# Patient Record
Sex: Male | Born: 1960 | Race: White | Hispanic: No | Marital: Single | Smoking: Current every day smoker
Health system: Southern US, Community
[De-identification: ages and names within clinical notes are randomized; demographics above are authoritative.]

## PROBLEM LIST (undated history)

## (undated) DIAGNOSIS — I10 Essential (primary) hypertension: Secondary | ICD-10-CM

## (undated) DIAGNOSIS — Z720 Tobacco use: Secondary | ICD-10-CM

## (undated) DIAGNOSIS — F1010 Alcohol abuse, uncomplicated: Secondary | ICD-10-CM

## (undated) DIAGNOSIS — L309 Dermatitis, unspecified: Secondary | ICD-10-CM

## (undated) DIAGNOSIS — K859 Acute pancreatitis without necrosis or infection, unspecified: Secondary | ICD-10-CM

## (undated) DIAGNOSIS — E7800 Pure hypercholesterolemia, unspecified: Secondary | ICD-10-CM

## (undated) DIAGNOSIS — E119 Type 2 diabetes mellitus without complications: Secondary | ICD-10-CM

## (undated) HISTORY — DX: Pure hypercholesterolemia, unspecified: E78.00

## (undated) HISTORY — PX: WRIST SURGERY: SHX841

---

## 2001-08-22 ENCOUNTER — Emergency Department (HOSPITAL_COMMUNITY): Admission: EM | Admit: 2001-08-22 | Discharge: 2001-08-22 | Payer: Self-pay | Admitting: Emergency Medicine

## 2001-08-22 ENCOUNTER — Encounter: Payer: Self-pay | Admitting: Emergency Medicine

## 2015-05-24 ENCOUNTER — Ambulatory Visit: Payer: Self-pay | Admitting: Neurology

## 2018-09-08 ENCOUNTER — Encounter (HOSPITAL_COMMUNITY): Payer: Self-pay | Admitting: Emergency Medicine

## 2018-09-08 ENCOUNTER — Emergency Department (HOSPITAL_COMMUNITY): Payer: Self-pay

## 2018-09-08 ENCOUNTER — Inpatient Hospital Stay (HOSPITAL_COMMUNITY)
Admission: EM | Admit: 2018-09-08 | Discharge: 2018-09-10 | DRG: 439 | Disposition: A | Discharge status: Home / Self Care | Payer: Self-pay | Attending: Internal Medicine | Admitting: Internal Medicine

## 2018-09-08 ENCOUNTER — Other Ambulatory Visit: Payer: Self-pay

## 2018-09-08 DIAGNOSIS — R112 Nausea with vomiting, unspecified: Secondary | ICD-10-CM

## 2018-09-08 DIAGNOSIS — F10188 Alcohol abuse with other alcohol-induced disorder: Secondary | ICD-10-CM | POA: Diagnosis present

## 2018-09-08 DIAGNOSIS — E876 Hypokalemia: Secondary | ICD-10-CM | POA: Diagnosis present

## 2018-09-08 DIAGNOSIS — R109 Unspecified abdominal pain: Secondary | ICD-10-CM | POA: Diagnosis present

## 2018-09-08 DIAGNOSIS — R1013 Epigastric pain: Secondary | ICD-10-CM

## 2018-09-08 DIAGNOSIS — F1010 Alcohol abuse, uncomplicated: Secondary | ICD-10-CM

## 2018-09-08 DIAGNOSIS — E781 Pure hyperglyceridemia: Secondary | ICD-10-CM | POA: Diagnosis present

## 2018-09-08 DIAGNOSIS — Z720 Tobacco use: Secondary | ICD-10-CM

## 2018-09-08 DIAGNOSIS — F17210 Nicotine dependence, cigarettes, uncomplicated: Secondary | ICD-10-CM | POA: Diagnosis present

## 2018-09-08 DIAGNOSIS — E785 Hyperlipidemia, unspecified: Secondary | ICD-10-CM | POA: Diagnosis present

## 2018-09-08 DIAGNOSIS — Z880 Allergy status to penicillin: Secondary | ICD-10-CM

## 2018-09-08 DIAGNOSIS — K852 Alcohol induced acute pancreatitis without necrosis or infection: Principal | ICD-10-CM

## 2018-09-08 DIAGNOSIS — K859 Acute pancreatitis without necrosis or infection, unspecified: Secondary | ICD-10-CM | POA: Insufficient documentation

## 2018-09-08 DIAGNOSIS — L309 Dermatitis, unspecified: Secondary | ICD-10-CM | POA: Diagnosis present

## 2018-09-08 DIAGNOSIS — Y901 Blood alcohol level of 20-39 mg/100 ml: Secondary | ICD-10-CM | POA: Diagnosis present

## 2018-09-08 HISTORY — DX: Dermatitis, unspecified: L30.9

## 2018-09-08 HISTORY — DX: Tobacco use: Z72.0

## 2018-09-08 HISTORY — DX: Acute pancreatitis without necrosis or infection, unspecified: K85.90

## 2018-09-08 HISTORY — DX: Alcohol abuse, uncomplicated: F10.10

## 2018-09-08 LAB — LIPASE, BLOOD: Lipase: 218 U/L — ABNORMAL HIGH (ref 11–51)

## 2018-09-08 LAB — RAPID URINE DRUG SCREEN, HOSP PERFORMED
Amphetamines: NOT DETECTED
Barbiturates: NOT DETECTED
Benzodiazepines: NOT DETECTED
Cocaine: NOT DETECTED
Opiates: POSITIVE — AB
Tetrahydrocannabinol: NOT DETECTED

## 2018-09-08 LAB — COMPREHENSIVE METABOLIC PANEL
ALBUMIN: 4 g/dL (ref 3.5–5.0)
ALK PHOS: 77 U/L (ref 38–126)
ALT: 83 U/L — ABNORMAL HIGH (ref 0–44)
AST: 89 U/L — ABNORMAL HIGH (ref 15–41)
Anion gap: 17 — ABNORMAL HIGH (ref 5–15)
BUN: 12 mg/dL (ref 6–20)
CO2: 17 mmol/L — ABNORMAL LOW (ref 22–32)
Calcium: 8.7 mg/dL — ABNORMAL LOW (ref 8.9–10.3)
Chloride: 102 mmol/L (ref 98–111)
Creatinine, Ser: 0.72 mg/dL (ref 0.61–1.24)
GFR calc Af Amer: >60 mL/min (ref >60)
GFR calc non Af Amer: >60 mL/min (ref >60)
GLUCOSE: 143 mg/dL — AB (ref 70–99)
Potassium: 3.6 mmol/L (ref 3.5–5.1)
Sodium: 136 mmol/L (ref 135–145)
Total Bilirubin: 1.4 mg/dL — ABNORMAL HIGH (ref 0.3–1.2)
Total Protein: 6.6 g/dL (ref 6.5–8.1)

## 2018-09-08 LAB — CBC
HCT: 45.4 % (ref 39.0–52.0)
Hemoglobin: 15.8 g/dL (ref 13.0–17.0)
MCH: 32.2 pg (ref 26.0–34.0)
MCHC: 34.8 g/dL (ref 30.0–36.0)
MCV: 92.7 fL (ref 80.0–100.0)
Platelets: 166 10*3/uL (ref 150–400)
RBC: 4.9 MIL/uL (ref 4.22–5.81)
RDW: 13.2 % (ref 11.5–15.5)
WBC: 11.1 10*3/uL — ABNORMAL HIGH (ref 4.0–10.5)
nRBC: 0 % (ref 0.0–0.2)

## 2018-09-08 LAB — MAGNESIUM: Magnesium: 1.7 mg/dL (ref 1.7–2.4)

## 2018-09-08 LAB — URINALYSIS, ROUTINE W REFLEX MICROSCOPIC
Bacteria, UA: NONE SEEN
Bilirubin Urine: NEGATIVE
Glucose, UA: NEGATIVE mg/dL
Ketones, ur: 5 mg/dL — AB
Leukocytes,Ua: NEGATIVE
Nitrite: NEGATIVE
PROTEIN: NEGATIVE mg/dL
Specific Gravity, Urine: 1.029 (ref 1.005–1.030)
pH: 7 (ref 5.0–8.0)

## 2018-09-08 LAB — ETHANOL: Alcohol, Ethyl (B): 32 mg/dL — ABNORMAL HIGH (ref <10)

## 2018-09-08 IMAGING — CT CT ABD-PELV W/ CM
3 of 5 series · 16 of 46 positions shown, 18 images · IV contrast (ISOVUE)
Comparison: None.

CLINICAL DATA: Abdominal pain 1 day

EXAM:
CT ABDOMEN AND PELVIS WITH CONTRAST
TECHNIQUE: Multidetector CT imaging of the abdomen and pelvis was performed
using the standard protocol following bolus administration of
intravenous contrast.
CONTRAST:  100mL [LL] IOPAMIDOL ([LL]) INJECTION 61%

[Series 2: axial st · axial · 0.76mm/px · z∈[-558,-153]mm · 10 of 101 slices shown, 12 images]
[im 10/101  soft-tissue]
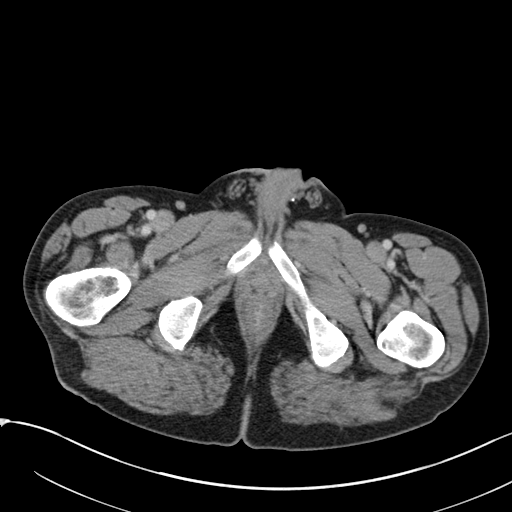
[im 10/101  bone]
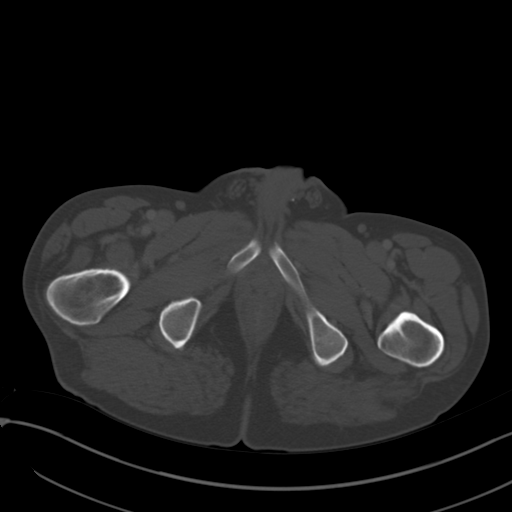
[im 19/101  soft-tissue]
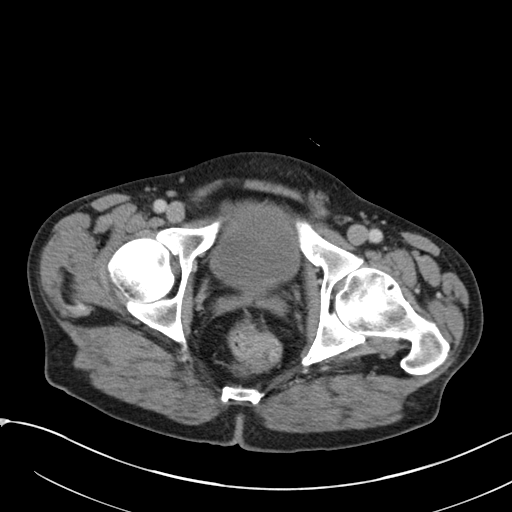
[im 28/101  soft-tissue]
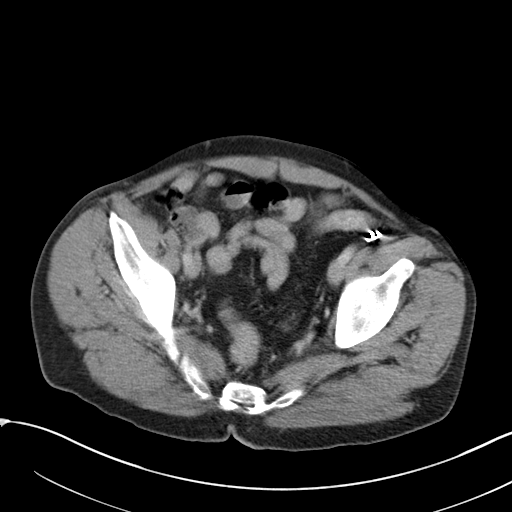
[im 37/101  soft-tissue]
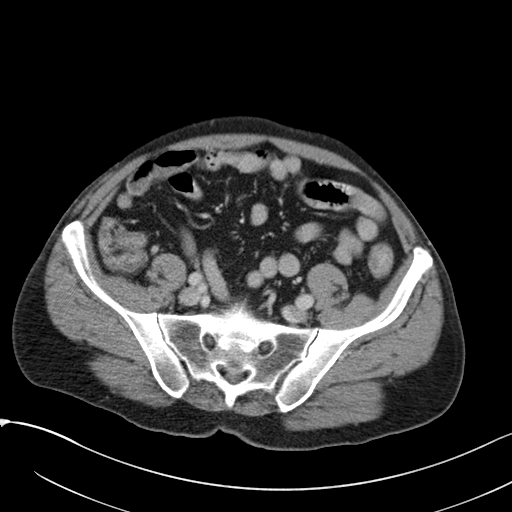
[im 46/101  soft-tissue]
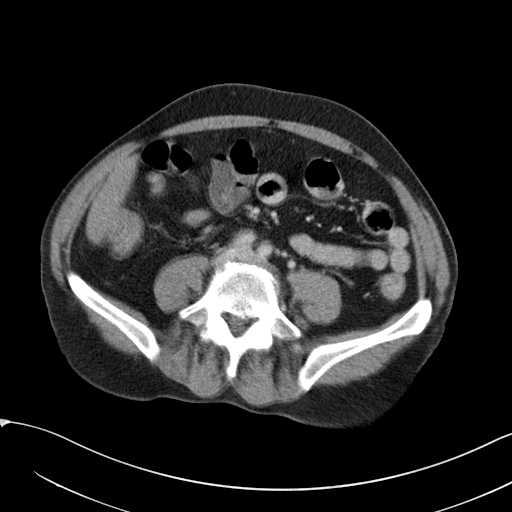
[im 55/101  soft-tissue]
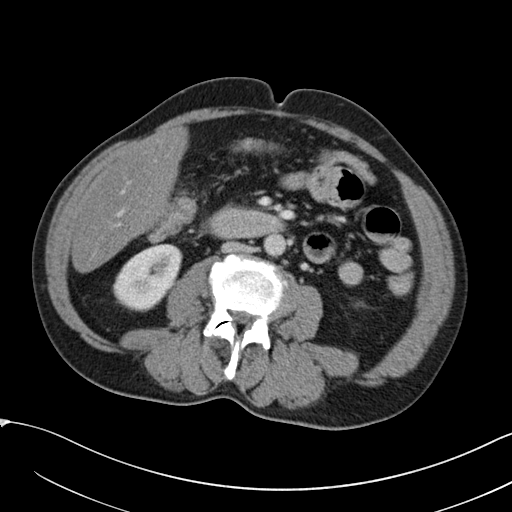
[im 64/101  soft-tissue]
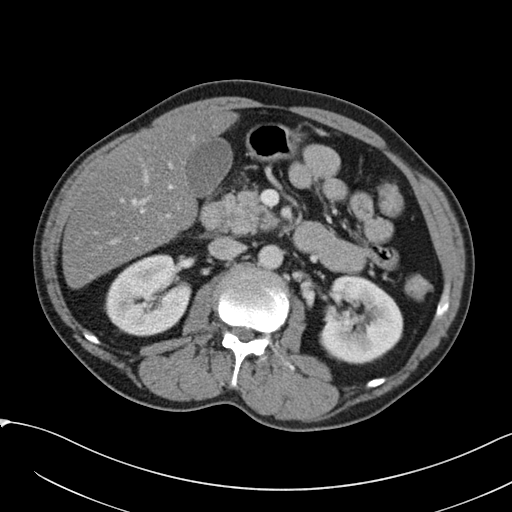
[im 73/101  soft-tissue]
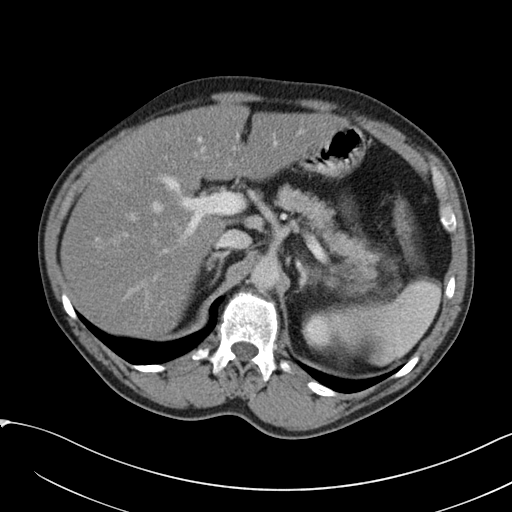
[im 82/101  soft-tissue]
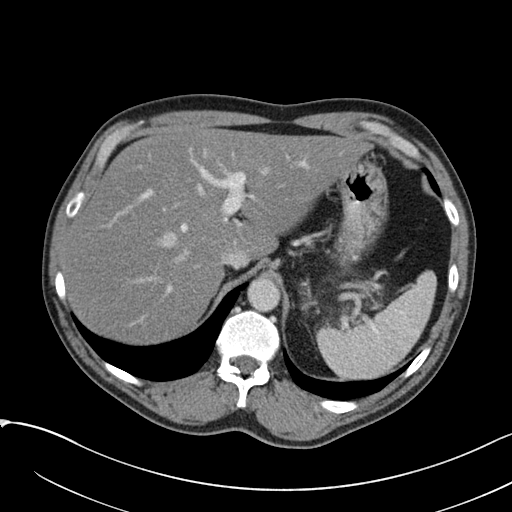
[im 82/101  bone]
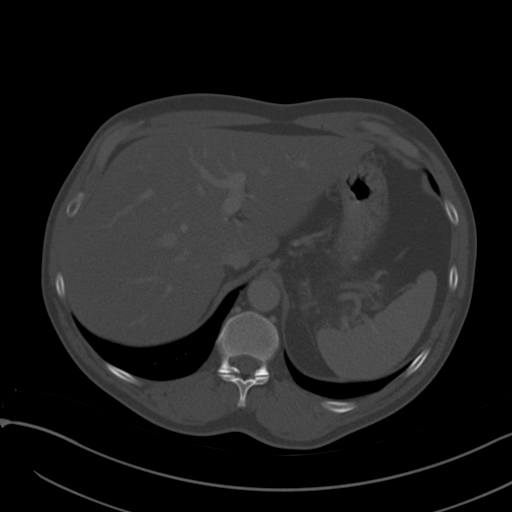
[im 91/101  soft-tissue]
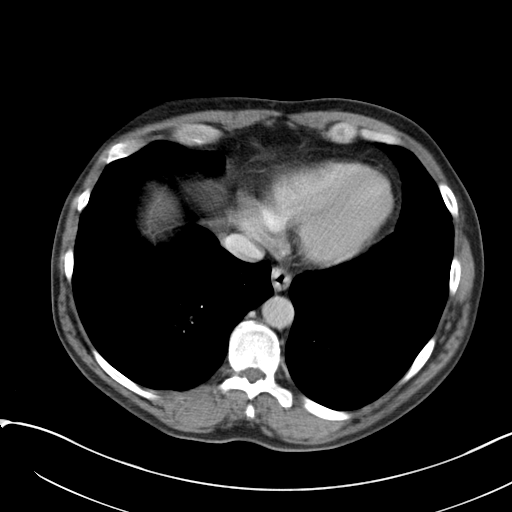

[Series 4: lung bases · axial · 0.70mm/px · z∈[-277,-225]mm · 3 of 96 slices shown]
[im 9/96  bone]
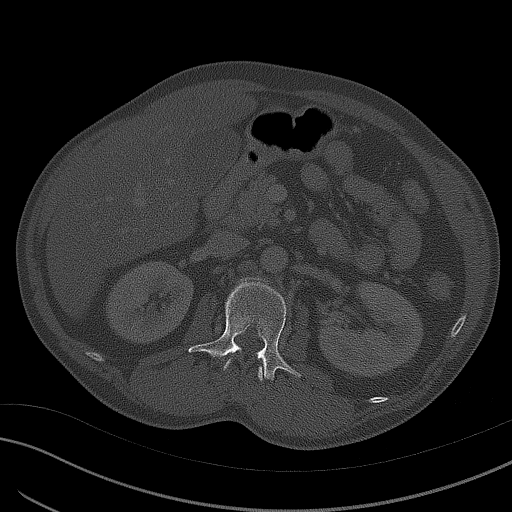
[im 18/96  bone]
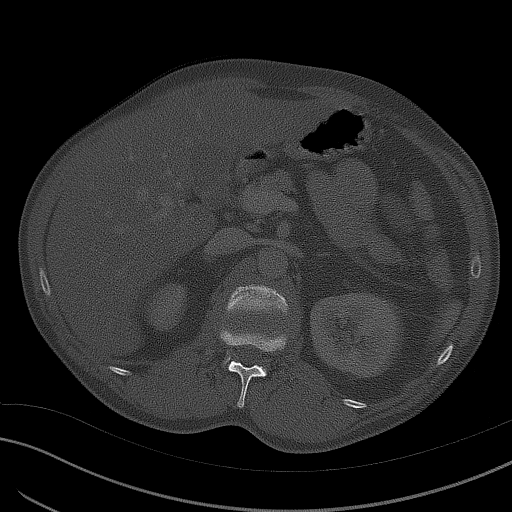
[im 35/96  bone]
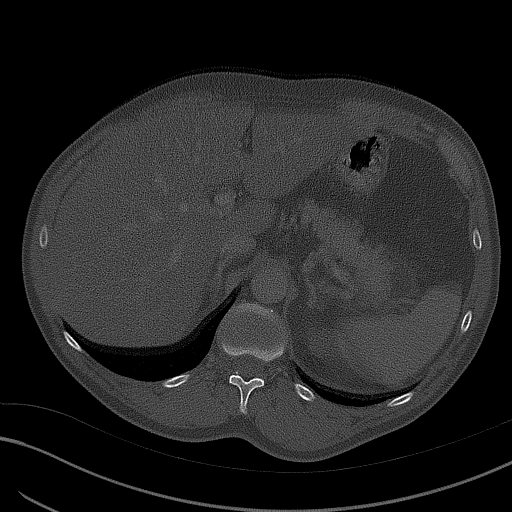

[Series 5: coronal st · coronal · 0.74mm/px · 3 of 150 slices shown]
[im 50/150  soft-tissue]
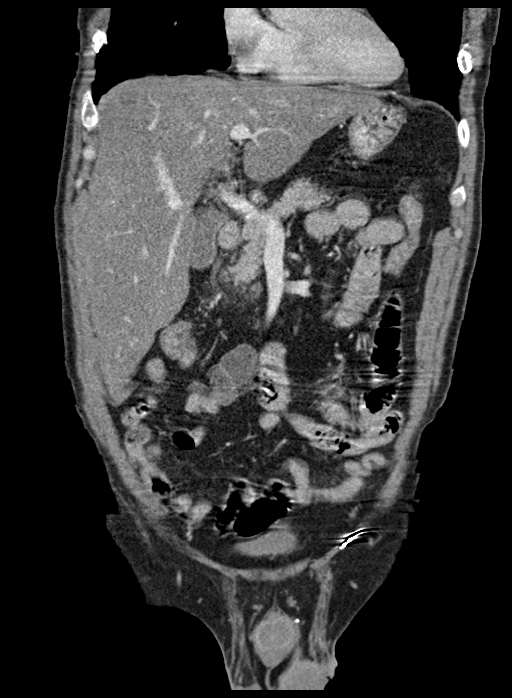
[im 67/150  soft-tissue]
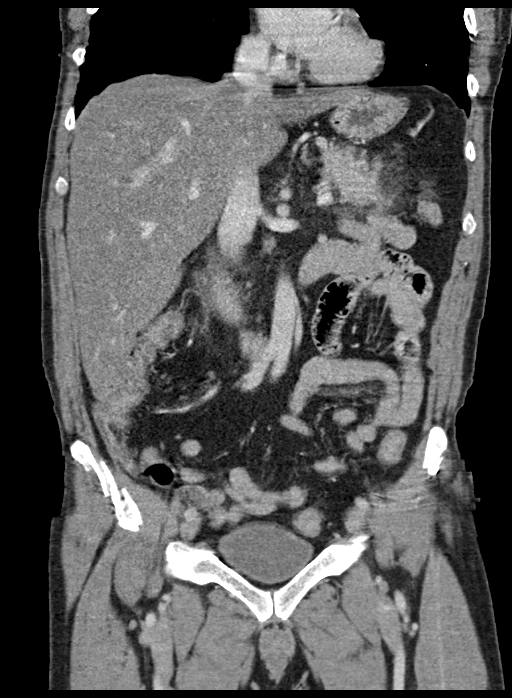
[im 83/150  soft-tissue]
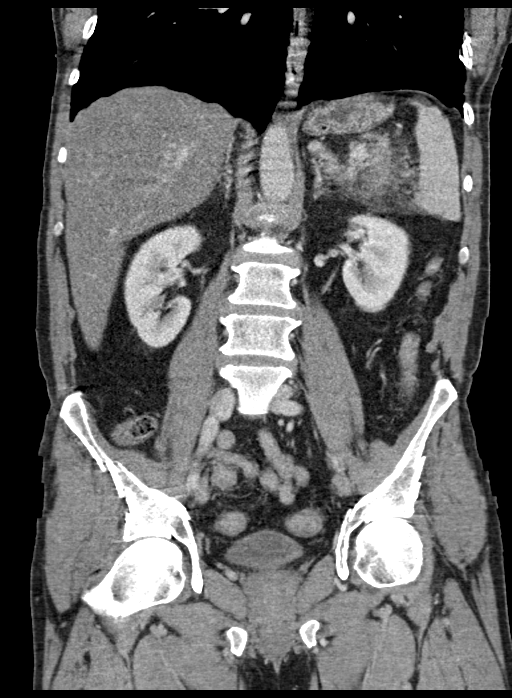

[16 of 46 positions shown; findings below may reference images not displayed]

FINDINGS: Lower chest: No acute abnormality.

Hepatobiliary: Diffuse hepatic steatosis.  Unremarkable gallbladder.

Pancreas: There is stranding about the head and tail of the
pancreas. This is compatible with pancreatitis. There is no evidence
of pancreatic hemorrhage or pancreatic necrosis.

Spleen: Unremarkable

Adrenals/Urinary Tract: Kidneys and adrenal glands are unremarkable.
Bladder is decompressed and within normal limits.

Stomach/Bowel: Normal appendix. No obvious mass in the colon. Small
bowel and stomach are decompressed.

Vascular/Lymphatic: No abnormal retroperitoneal adenopathy. No
evidence of aortic aneurysm.

Reproductive: Unremarkable prostate.

Other: No free fluid.

Musculoskeletal: No vertebral compression deformity. There are short
pedicles in the lumbar spine resulting in congenital spinal
stenosis. L4-5 moderate facet arthropathy. Old right L4 nonunited
transverse process fracture.
IMPRESSION: Findings above are consistent with acute pancreatitis. There is no
acute peripancreatic fluid collection, pancreatic necrosis, or
pancreatic hemorrhage.

Diffuse hepatic steatosis.

## 2018-09-08 MED ORDER — HYDROMORPHONE HCL 1 MG/ML IJ SOLN
0.5000 mg | INTRAMUSCULAR | Status: DC | PRN
  Administered 2018-09-08 – 2018-09-09 (×4): 1 mg via INTRAVENOUS
  Filled 2018-09-08 (×4): qty 1

## 2018-09-08 MED ORDER — ONDANSETRON HCL 4 MG/2ML IJ SOLN
4.0000 mg | Freq: Four times a day (QID) | INTRAMUSCULAR | Status: DC | PRN
  Administered 2018-09-08 – 2018-09-10 (×5): 4 mg via INTRAVENOUS
  Filled 2018-09-08 (×5): qty 2

## 2018-09-08 MED ORDER — ONDANSETRON HCL 4 MG/2ML IJ SOLN
4.0000 mg | Freq: Once | INTRAMUSCULAR | Status: AC
  Administered 2018-09-08: 4 mg via INTRAVENOUS
  Filled 2018-09-08: qty 2

## 2018-09-08 MED ORDER — HYDROMORPHONE HCL 1 MG/ML IJ SOLN
1.0000 mg | Freq: Once | INTRAMUSCULAR | Status: AC
  Administered 2018-09-08: 1 mg via INTRAVENOUS
  Filled 2018-09-08: qty 1

## 2018-09-08 MED ORDER — FENTANYL CITRATE (PF) 100 MCG/2ML IJ SOLN
50.0000 ug | INTRAMUSCULAR | Status: DC | PRN
  Administered 2018-09-08: 50 ug via NASAL
  Filled 2018-09-08: qty 2

## 2018-09-08 MED ORDER — ONDANSETRON 4 MG PO TBDP
4.0000 mg | ORAL_TABLET | Freq: Once | ORAL | Status: AC | PRN
  Administered 2018-09-08: 4 mg via ORAL
  Filled 2018-09-08: qty 1

## 2018-09-08 MED ORDER — MORPHINE SULFATE (PF) 4 MG/ML IV SOLN
4.0000 mg | Freq: Once | INTRAVENOUS | Status: AC
  Administered 2018-09-08: 4 mg via INTRAVENOUS
  Filled 2018-09-08: qty 1

## 2018-09-08 MED ORDER — IOPAMIDOL (ISOVUE-300) INJECTION 61%
INTRAVENOUS | Status: AC
  Filled 2018-09-08: qty 100

## 2018-09-08 MED ORDER — ENOXAPARIN SODIUM 40 MG/0.4ML ~~LOC~~ SOLN
40.0000 mg | SUBCUTANEOUS | Status: DC
  Administered 2018-09-09: 40 mg via SUBCUTANEOUS
  Filled 2018-09-08: qty 0.4

## 2018-09-08 MED ORDER — SODIUM CHLORIDE (PF) 0.9 % IJ SOLN
INTRAMUSCULAR | Status: AC
  Filled 2018-09-08: qty 50

## 2018-09-08 MED ORDER — SODIUM CHLORIDE 0.9 % IV BOLUS
1000.0000 mL | Freq: Once | INTRAVENOUS | Status: AC
  Administered 2018-09-08: 1000 mL via INTRAVENOUS

## 2018-09-08 MED ORDER — IOPAMIDOL (ISOVUE-300) INJECTION 61%
100.0000 mL | Freq: Once | INTRAVENOUS | Status: AC | PRN
  Administered 2018-09-08: 100 mL via INTRAVENOUS

## 2018-09-08 MED ORDER — LORAZEPAM 2 MG/ML IJ SOLN: 1.0000 mg | Freq: Four times a day (QID) | INTRAMUSCULAR | Status: DC | PRN

## 2018-09-08 MED ORDER — SODIUM CHLORIDE 0.9% FLUSH
3.0000 mL | Freq: Once | INTRAVENOUS | Status: AC
  Administered 2018-09-08: 3 mL via INTRAVENOUS

## 2018-09-08 MED ORDER — SODIUM CHLORIDE 0.9 % IV SOLN
INTRAVENOUS | Status: DC
  Administered 2018-09-09: 09:00:00 via INTRAVENOUS

## 2018-09-08 MED ORDER — NICOTINE 21 MG/24HR TD PT24: 21.0000 mg | MEDICATED_PATCH | Freq: Every day | TRANSDERMAL | Status: DC | PRN

## 2018-09-08 MED ORDER — METOCLOPRAMIDE HCL 5 MG/ML IJ SOLN
10.0000 mg | Freq: Once | INTRAMUSCULAR | Status: AC
  Administered 2018-09-08: 10 mg via INTRAVENOUS
  Filled 2018-09-08: qty 2

## 2018-09-08 MED ORDER — THIAMINE HCL 100 MG/ML IJ SOLN
Freq: Once | INTRAVENOUS | Status: AC
  Administered 2018-09-08: 23:00:00 via INTRAVENOUS
  Filled 2018-09-08 (×2): qty 1000

## 2018-09-08 NOTE — ED Triage Notes (Signed)
Pt presents via ems. Abd pain since last night. Stabbing feeling. Generalized. N/V/D. Alert and oriented.

## 2018-09-08 NOTE — H&P (Signed)
History and Physical    PLEASE NOTE THAT DRAGON DICTATION SOFTWARE WAS USED IN THE CONSTRUCTION OF THIS NOTE.   Samuel Abbott:[id number] DOB: Sep 19, 1960 DOA: 09/08/2018  PCP: Norm Salt, PA Patient coming from: home  I have personally briefly reviewed patient's old medical records in [facility]  Chief Complaint: abdominal pain  HPI: Samuel Abbott is a 58 y.o. male with medical history significant for acute pancreatitis, chronic alcohol abuse, who is admitted to [facility] long hospital on 09/08/2018 with acute alcoholic pancreatitis after presenting from home to the [facility] long emergency department complaining of abdominal pain.  The patient reports 1 day of progressive, constant, stabbing abdominal discomfort that he describes as diffuse in orientation, but most prominent over the epigastrium.  Reports exacerbation of the discomfort with palpation of the abdomen.  He conveys associated nausea resulting in 4-5 episodes of nonbloody, nonbilious emesis over that same timeframe.  He also notes one episode of loose stool, that occurred earlier today in the absence of any associated melena or hematochezia.  He denies any associated subjective fever, chills, rigors, or generalized myalgias.  Also denies any associated pain, shortness of breath, or rash.  No recent travel.  Denies any recent trauma.  The patient acknowledges a history of chronic alcohol abuse, in which he reports consumption of 8 shots of whiskey and 2 beers on a daily basis for approximately the last 20 years.  He reports 1 prior episode of acute pancreatitis, which he states occurred approximately 15 years ago.  In terms of recent changes to his outpatient medication regimen, the patient reports that, 3 weeks ago, he was prescribed prednisone 20 mg p.o. daily as needed for exacerbation of eczema.  Over the last 3 weeks, he reports that he is typically taken the prednisone once every 3 to 4 days.  Denies any recent use of  antibiotics or thiazide diuretics.   ED Course: Vital signs in the ED were notable for the following: Temperature max 98; heart rate 73-90; blood pressure 168/100; respiratory rate 16-18; and oxygen saturation 98-100% on room air.  Labs in the ED were notable for the following: CMP notable for sodium 136, potassium 3.6, creatinine 0.72, adjusted calcium 8.7, alkaline phosphatase 77, AST 89, ALT 83, and total bilirubin 1.4.  Lipase 218.  Serum ethanol level 32.  CT abdomen/pelvis, per final radiology report, showed inflammation and stranding associated with the head and tail the pancreas consistent with acute pancreatitis in the absence of any associated evidence of cyst or necrosis.  Additionally, CT abdomen/pelvis showed no evidence of gallbladder wall inflammation, dilation of common bile duct, or choledocholithiasis.  While in the ED, the following were administered: Fentanyl 50 mcg IV x1, Dilaudid 1 mg IV x1, morphine 4 mg IV x1, Zofran 4 mg IV x1, Reglan 10 mg IV x1, and 2 L IV normal saline bolus.  Subsequently, the patient was admitted to the MedSurg floor for further evaluation and management of suspected acute alcoholic pancreatitis.   Review of Systems: As per HPI otherwise 10 point review of systems negative.   Past Medical History:  Diagnosis Date  . Acute pancreatitis   . Alcohol abuse   . Eczema   . Tobacco abuse     History reviewed. No pertinent surgical history.  (Patient denies any prior surgical procedures).   Social History:  reports that he has been smoking cigarettes. He has a 60.00 pack-year smoking history. He uses smokeless tobacco. He reports current alcohol use  of about 56.0 standard drinks of alcohol per week. No history on file for drug.   Allergies  Allergen Reactions  . Penicillins     Family History  Problem Relation Age of Onset  . CVA Father      Prior to Admission medications   Not on File     Objective    Physical Exam: Vitals:    09/08/18 1543 09/08/18 1547 09/08/18 1708 09/08/18 1730  BP:   (!) 168/100 (!) 181/95  Pulse:  89 88 73  Resp:  (!) 22 18 16   Temp:   98 F (36.7 C)   TempSrc:  Oral Axillary   SpO2: 98% 100% 100% 98%    General: appears to be stated age; alert, oriented Skin: warm, dry Head:  AT/Ong Mouth:  Oral mucosa membranes appear dry, normal dentition Neck: supple; trachea midline Heart:  RRR; did not appreciate any M/R/G Lungs: CTAB, did not appreciate any wheezes, rales, or rhonchi Abdomen: + BS; soft, ND; diffuse tenderness, most prominent over the left upper quadrant and epigastrium in the absence of any associated guarding, rigidity, or rebound tenderness. Extremities: no peripheral edema, no muscle wasting   Labs on Admission: I have personally reviewed following labs and imaging studies  CBC: Recent Labs  Lab 09/08/18 1643  WBC 11.1*  HGB 15.8  HCT 45.4  MCV 92.7  PLT 166   Basic Metabolic Panel: Recent Labs  Lab 09/08/18 1643  NA 136  K 3.6  CL 102  CO2 17*  GLUCOSE 143*  BUN 12  CREATININE 0.72  CALCIUM 8.7*   GFR: CrCl cannot be calculated (Unknown ideal weight.). Liver Function Tests: Recent Labs  Lab 09/08/18 1643  AST 89*  ALT 83*  ALKPHOS 77  BILITOT 1.4*  PROT 6.6  ALBUMIN 4.0   Recent Labs  Lab 09/08/18 1643  LIPASE 218*   No results for input(s): AMMONIA in the last 168 hours. Coagulation Profile: No results for input(s): INR, PROTIME in the last 168 hours. Cardiac Enzymes: No results for input(s): CKTOTAL, CKMB, CKMBINDEX, TROPONINI in the last 168 hours. BNP (last 3 results) No results for input(s): PROBNP in the last 8760 hours. HbA1C: No results for input(s): HGBA1C in the last 72 hours. CBG: No results for input(s): GLUCAP in the last 168 hours. Lipid Profile: No results for input(s): CHOL, HDL, LDLCALC, TRIG, CHOLHDL, LDLDIRECT in the last 72 hours. Thyroid Function Tests: No results for input(s): TSH, T4TOTAL, FREET4, T3FREE,  THYROIDAB in the last 72 hours. Anemia Panel: No results for input(s): VITAMINB12, FOLATE, FERRITIN, TIBC, IRON, RETICCTPCT in the last 72 hours. Urine analysis: No results found for: COLORURINE, APPEARANCEUR, LABSPEC, PHURINE, GLUCOSEU, HGBUR, BILIRUBINUR, KETONESUR, PROTEINUR, UROBILINOGEN, NITRITE, LEUKOCYTESUR  Radiological Exams on Admission: Ct Abdomen Pelvis W Contrast  Result Date: 09/08/2018 CLINICAL DATA:  Abdominal pain 1 day EXAM: CT ABDOMEN AND PELVIS WITH CONTRAST TECHNIQUE: Multidetector CT imaging of the abdomen and pelvis was performed using the standard protocol following bolus administration of intravenous contrast. CONTRAST:  ISOVUE-300 IOPAMIDOL (ISOVUE-300) INJECTION 61% COMPARISON:  None. FINDINGS: Lower chest: No acute abnormality. Hepatobiliary: Diffuse hepatic steatosis.  Unremarkable gallbladder. Pancreas: There is stranding about the head and tail of the pancreas. This is compatible with pancreatitis. There is no evidence of pancreatic hemorrhage or pancreatic necrosis. Spleen: Unremarkable Adrenals/Urinary Tract: Kidneys and adrenal glands are unremarkable. Bladder is decompressed and within normal limits. Stomach/Bowel: Normal appendix. No obvious mass in the colon. Small bowel and stomach are decompressed. Vascular/Lymphatic: No  abnormal retroperitoneal adenopathy. No evidence of aortic aneurysm. Reproductive: Unremarkable prostate. Other: No free fluid. Musculoskeletal: No vertebral compression deformity. 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 L4-5 moderate facet arthropathy. 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 IMPRESSION: Findings above are consistent with acute pancreatitis. There is no acute peripancreatic fluid collection, pancreatic necrosis, or pancreatic hemorrhage. Diffuse hepatic steatosis. Electronically Signed   By: Jolaine Click M.D.   On: 09/08/2018 18:17     Assessment/Plan   NATRONE MCQUISTION is a 58 y.o. male with medical history significant for acute pancreatitis, chronic alcohol abuse, who is admitted to [facility] long hospital on 09/08/2018 with acute alcoholic pancreatitis after presenting from home to the [facility] long emergency department complaining of abdominal pain.   Principal Problem:   Acute alcoholic pancreatitis Active Problems:   Abdominal pain   Nausea & vomiting   Tobacco abuse   Chronic alcohol abuse   #) Acute alcoholic pancreatitis: Diagnosis on the basis of 1 day of progressive abdominal discomfort most prominent over the epigastrium, associated with nausea/vomiting, with CT abdomen/pelvis showing inflammation/stranding of the head and tail of the pancreas consistent with acute pancreatitis in the absence of associated cyst or necrosis.  On the basis of daily consumption of greater than 10 alcoholic beverages, acute alcoholic pancreatitis is suspected.  CT abdomen/pelvis shows no evidence of gallbladder wall thickening, common bile duct dilation, or choledocholithiasis.  Interestingly, the patient reports that he was started on PRN prednisone 3 weeks ago, which is notable given that exogenous glucocorticoids can be a contributing factor leading to acute alcoholic pancreatitis.  Of note, no elevation of adjusted serum calcium level.  Plan: N.p.o.  Banana bag x1 now, followed by transition to IV normal saline at 200 cc/h, which should occur around 0200 on 09/09/18.  PRN IV Zofran.  Counseled the patient on the importance of subsequent abstinence from alcohol.  Check triglyceride level.  Repeat CMP in the morning.    #) Chronic alcohol abuse: the patient reports that his daily alcohol consumption typically consists of 8 shots of whiskey as well as 2 beers, and he states that he has been consuming daily alcohol at this volume for approximately 20 years.  Alcohol appears to be the most likely source leading to this evening's presentation of acute pancreatitis, as  further described above.  Patient reports that his last alcohol consumption occurred on the afternoon of 09/08/2018.  He is unsure if he typically experiences alcohol withdrawal symptoms, because he states that he has not been abstinent long enough to evaluate this possibility  Plan: Counseled the patient on importance of subsequent abstinence from alcohol.  Banana bag x 3 days.  Check INR.  Close monitoring of ensuing electrolytes.  Add on serum magnesium level. check CMP in the morning.  While the patient if he is accurately representing the timing of his most recent alcohol consumption, would most likely not begin to experience alcohol withdrawal symptoms for multiple days from the present time, I have ordered CIWA protocol effect now.  I suspect that the patient is accurately conveying the timing of his most recent alcohol consumption given finding of serum ethanol level of 32 at presentation.     #) Tobacco abuse: The patient reports that he is a current smoker, having smoked 1.5 packs/day over the last 40 years, and reports that he has no interest in smoking discontinuation at this time.  Plan: Counseled the patient on the importance of complete  smoking discontinuation.  I have ordered a PRN nicotine patch for use during this hospitalization.   DVT prophylaxis: Lovenox 40 mg subcu daily Code Status: Full Family Communication: None Disposition Plan:  Per Rounding Team Consults called: none Admission status: Inpatient; MedSurg.    PLEASE NOTE THAT DRAGON DICTATION SOFTWARE WAS USED IN THE CONSTRUCTION OF THIS NOTE.   Angie FavaJustin B Howerter DO [facility] Pager [phone] From 6PM- 2AM.   Otherwise, please contact night-coverage  [email or web address] Password [email or web address]  09/08/2018, 6:45 PM

## 2018-09-08 NOTE — ED Notes (Signed)
Provider at bedside

## 2018-09-08 NOTE — Plan of Care (Signed)
  Problem: Pain Goal: Understanding of pain management will improve Outcome: Progressing   

## 2018-09-08 NOTE — ED Notes (Signed)
Patient refused blood draw at this time.

## 2018-09-08 NOTE — ED Notes (Signed)
ED Provider at bedside. 

## 2018-09-08 NOTE — ED Notes (Signed)
Patient ambulatory to the restroom without difficulty.

## 2018-09-08 NOTE — ED Provider Notes (Addendum)
[facility]-EMERGENCY DEPT Provider Note   CSN: [id number] Arrival date & time: 09/08/18  1539    History   Chief Complaint Chief Complaint  Patient presents with  . Abdominal Pain    HPI Samuel Abbott is a 58 y.o. male.     The history is provided by the patient. No language interpreter was used.  Abdominal Pain     58 year old here via EMS for evaluation of abdominal pain.  History be difficult as patient is in moderate discomfort.  Patient report acute onset of diffuse abdominal pain since last night.  Pain is described as a stabbing sensation throughout his abdomen and become progressively worsening.  Pain is associate with nausea, has vomited multiple episodes of nonbloody nonbilious contents and having some loose stools.  Pain is moderate to severe and nothing seems to make it better or worse.  No specific treatment tried.  No report of fever or chills, no chest pain shortness of breath productive cough dysuria hematuria or rash.  As that he was doing fine yesterday and ate fine.  He does admits to drinking 5-6 beers last night and a streak on regular basis.  Remote history of pancreatitis.  States this felt different from his prior pancreatitis.  No prior abdominal surgery.       History reviewed. No pertinent past medical history.  There are no active problems to display for this patient.   History reviewed. No pertinent surgical history.      Home Medications    Prior to Admission medications   Not on File    Family History History reviewed. No pertinent family history.  Social History Social History   Tobacco Use  . Smoking status: Not on file  Substance Use Topics  . Alcohol use: Yes    Alcohol/week: 14.0 standard drinks    Types: 14 Cans of beer per week  . Drug use: Not on file     Allergies   Penicillins   Review of Systems Review of Systems  Gastrointestinal: Positive for abdominal pain.  All other systems  reviewed and are negative.    Physical Exam Updated Vital Signs BP (!) 181/95   Pulse 73   Temp 98 F (36.7 C) (Axillary)   Resp 16   SpO2 98%   Physical Exam Vitals signs and nursing note reviewed.  Constitutional:      General: He is in acute distress (Appears uncomfortable, writhing in bed.).     Appearance: He is well-developed.  HENT:     Head: Atraumatic.  Eyes:     Conjunctiva/sclera: Conjunctivae normal.  Neck:     Musculoskeletal: Neck supple.  Cardiovascular:     Rate and Rhythm: Tachycardia present.     Heart sounds: No murmur.  Pulmonary:     Comments: Mild tachypnea without wheezes, rales or rhonchi. Abdominal:     General: Abdomen is flat. There is no distension.     Palpations: Abdomen is soft.     Tenderness: There is generalized abdominal tenderness. There is no right CVA tenderness, left CVA tenderness, guarding or rebound. Negative signs include Murphy's sign and McBurney's sign.     Hernia: No hernia is present.  Skin:    Findings: No rash.  Neurological:     Mental Status: He is alert.  Psychiatric:        Mood and Affect: Mood normal.      ED Treatments / Results  Labs (all labs ordered are listed, but only  abnormal results are displayed) Labs Reviewed  LIPASE, BLOOD - Abnormal; Notable for the following components:      Result Value   Lipase 218 (*)    All other components within normal limits  COMPREHENSIVE METABOLIC PANEL - Abnormal; Notable for the following components:   CO2 17 (*)    Glucose, Bld 143 (*)    Calcium 8.7 (*)    AST 89 (*)    ALT 83 (*)    Total Bilirubin 1.4 (*)    Anion gap 17 (*)    All other components within normal limits  CBC - Abnormal; Notable for the following components:   WBC 11.1 (*)    All other components within normal limits  ETHANOL - Abnormal; Notable for the following components:   Alcohol, Ethyl (B) 32 (*)    All other components within normal limits  URINALYSIS, ROUTINE W REFLEX  MICROSCOPIC  RAPID URINE DRUG SCREEN, HOSP PERFORMED    EKG None  Radiology Ct Abdomen Pelvis W Contrast  Result Date: 09/08/2018 CLINICAL DATA:  Abdominal pain 1 day EXAM: CT ABDOMEN AND PELVIS WITH CONTRAST TECHNIQUE: Multidetector CT imaging of the abdomen and pelvis was performed using the standard protocol following bolus administration of intravenous contrast. CONTRAST:  100mL ISOVUE-300 IOPAMIDOL (ISOVUE-300) INJECTION 61% COMPARISON:  None. FINDINGS: Lower chest: No acute abnormality. Hepatobiliary: Diffuse hepatic steatosis.  Unremarkable gallbladder. Pancreas: There is stranding about the head and tail of the pancreas. This is compatible with pancreatitis. There is no evidence of pancreatic hemorrhage or pancreatic necrosis. Spleen: Unremarkable Adrenals/Urinary Tract: Kidneys and adrenal glands are unremarkable. Bladder is decompressed and within normal limits. Stomach/Bowel: Normal appendix. No obvious mass in the colon. Small bowel and stomach are decompressed. Vascular/Lymphatic: No abnormal retroperitoneal adenopathy. No evidence of aortic aneurysm. Reproductive: Unremarkable prostate. Other: No free fluid. Musculoskeletal: No vertebral compression deformity. 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 L4-5 moderate facet arthropathy. 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 IMPRESSION: Findings above are consistent with acute pancreatitis. There is no acute peripancreatic fluid collection, pancreatic necrosis, or pancreatic hemorrhage. Diffuse hepatic steatosis. Electronically Signed   By: Jolaine ClickArthur  Hoss M.D.   On: 09/08/2018 18:17    Procedures .Critical Care Performed by: Fayrene Helperran, Greene Diodato, PA-C Authorized by: Fayrene Helperran, Claribel Sachs, PA-C   Critical care provider statement:    Critical care time (minutes):  45   Critical care was time spent personally by me on the following activities:  Discussions with consultants, evaluation of patient's  response to treatment, examination of patient, ordering and performing treatments and interventions, ordering and review of laboratory studies, ordering and review of radiographic studies, pulse oximetry, re-evaluation of patient's condition, obtaining history from patient or surrogate and review of old charts   (including critical care time)  Medications Ordered in ED Medications  sodium chloride flush (NS) 0.9 % injection 3 mL (has no administration in time range)  fentaNYL (SUBLIMAZE) injection 50 mcg (50 mcg Nasal Given 09/08/18 1612)  iopamidol (ISOVUE-300) 61 % injection (has no administration in time range)  sodium chloride (PF) 0.9 % injection (has no administration in time range)  sodium chloride 0.9 % bolus 1,000 mL (has no administration in time range)  HYDROmorphone (DILAUDID) injection 1 mg (has no administration in time range)  ondansetron (ZOFRAN) injection 4 mg (has no administration in time range)  ondansetron (ZOFRAN-ODT) disintegrating tablet 4 mg (4 mg Oral Given 09/08/18 1551)  morphine 4 MG/ML injection 4 mg (4 mg Intravenous  Given 09/08/18 1705)  metoCLOPramide (REGLAN) injection 10 mg (10 mg Intravenous Given 09/08/18 1705)  sodium chloride 0.9 % bolus 1,000 mL (1,000 mLs Intravenous New Bag/Given 09/08/18 1705)  iopamidol (ISOVUE-300) 61 % injection 100 mL (100 mLs Intravenous Contrast Given 09/08/18 1736)     Initial Impression / Assessment and Plan / ED Course  I have reviewed the triage vital signs and the nursing notes.  Pertinent labs & imaging results that were available during my care of the patient were reviewed by me and considered in my medical decision making (see chart for details).        BP (!) 181/95   Pulse 73   Temp 98 F (36.7 C) (Axillary)   Resp 16   SpO2 98%    Final Clinical Impressions(s) / ED Diagnoses   Final diagnoses:  Alcohol-induced acute pancreatitis without infection or necrosis    ED Discharge Orders    None     4:50  PM Patient here with diffused abdominal pain since yesterday. Hx of prior pancreatitis and heavy alcohol use.  Pain is suggestive of pancreatitis.  He's having persistent vomiting and appears uncomfortable.  Pt made NPO, IVF given along with pain medication and antinausea medication.  Work up initiated.  Care discussed with Dr. Rhunette Croft.  6:31 PM Alcohol is mildly elevated at 32, lipase is 218, mild transaminitis with AST 89, ALT 83 and total bili 1.4.  WBC 11.1.  Abdominal pelvis CT scan with findings consistent with acute pancreatitis.  Acute.  Pancreatic fluid collection, necrosis or hemorrhage.  On reassessment patient still appears uncomfortable and will benefit from admission for further management.  Additional pain medication and antinausea medication given as well as IV fluid.  6:44 PM Appreciate consultation from [facility] Dr. Daleen Squibb who agrees to see and admit pt for treatment of pancreatitis.    Fayrene Helper, PA-C 09/08/18 1859    Derwood Kaplan, MD 09/08/18 2359  8:38 AM ADDENDUM: CC time added   Fayrene Helper, PA-C 09/21/18 3785    Derwood Kaplan, MD 09/23/18 [phone]

## 2018-09-09 ENCOUNTER — Encounter (HOSPITAL_COMMUNITY): Payer: Self-pay | Admitting: Internal Medicine

## 2018-09-09 DIAGNOSIS — R112 Nausea with vomiting, unspecified: Secondary | ICD-10-CM | POA: Diagnosis present

## 2018-09-09 DIAGNOSIS — R109 Unspecified abdominal pain: Secondary | ICD-10-CM | POA: Diagnosis present

## 2018-09-09 DIAGNOSIS — Z720 Tobacco use: Secondary | ICD-10-CM | POA: Diagnosis present

## 2018-09-09 DIAGNOSIS — K852 Alcohol induced acute pancreatitis without necrosis or infection: Secondary | ICD-10-CM | POA: Diagnosis present

## 2018-09-09 DIAGNOSIS — E781 Pure hyperglyceridemia: Secondary | ICD-10-CM

## 2018-09-09 DIAGNOSIS — F1010 Alcohol abuse, uncomplicated: Secondary | ICD-10-CM | POA: Diagnosis present

## 2018-09-09 LAB — COMPREHENSIVE METABOLIC PANEL
ALT: 70 U/L — ABNORMAL HIGH (ref 0–44)
AST: 61 U/L — ABNORMAL HIGH (ref 15–41)
Albumin: 4 g/dL (ref 3.5–5.0)
Alkaline Phosphatase: 72 U/L (ref 38–126)
Anion gap: 9 (ref 5–15)
BILIRUBIN TOTAL: 2.8 mg/dL — AB (ref 0.3–1.2)
BUN: 7 mg/dL (ref 6–20)
CO2: 23 mmol/L (ref 22–32)
Calcium: 7.8 mg/dL — ABNORMAL LOW (ref 8.9–10.3)
Chloride: 104 mmol/L (ref 98–111)
Creatinine, Ser: 0.61 mg/dL (ref 0.61–1.24)
GFR calc Af Amer: >60 mL/min (ref >60)
GFR calc non Af Amer: >60 mL/min (ref >60)
Glucose, Bld: 134 mg/dL — ABNORMAL HIGH (ref 70–99)
Potassium: 3.1 mmol/L — ABNORMAL LOW (ref 3.5–5.1)
Sodium: 136 mmol/L (ref 135–145)
TOTAL PROTEIN: 6.6 g/dL (ref 6.5–8.1)

## 2018-09-09 LAB — CBC
HCT: 43.6 % (ref 39.0–52.0)
Hemoglobin: 14.5 g/dL (ref 13.0–17.0)
MCH: 31.6 pg (ref 26.0–34.0)
MCHC: 33.3 g/dL (ref 30.0–36.0)
MCV: 95 fL (ref 80.0–100.0)
PLATELETS: 118 10*3/uL — AB (ref 150–400)
RBC: 4.59 MIL/uL (ref 4.22–5.81)
RDW: 13.3 % (ref 11.5–15.5)
WBC: 13.1 10*3/uL — ABNORMAL HIGH (ref 4.0–10.5)
nRBC: 0 % (ref 0.0–0.2)

## 2018-09-09 LAB — MAGNESIUM: Magnesium: 1.5 mg/dL — ABNORMAL LOW (ref 1.7–2.4)

## 2018-09-09 LAB — TRIGLYCERIDES
Triglycerides: 976 mg/dL — ABNORMAL HIGH (ref <150)
Triglycerides: 385 mg/dL — ABNORMAL HIGH (ref <150)

## 2018-09-09 LAB — LIPASE, BLOOD: Lipase: 315 U/L — ABNORMAL HIGH (ref 11–51)

## 2018-09-09 MED ORDER — PROMETHAZINE HCL 25 MG/ML IJ SOLN
12.5000 mg | Freq: Once | INTRAMUSCULAR | Status: AC
  Administered 2018-09-09: 12.5 mg via INTRAVENOUS
  Filled 2018-09-09: qty 1

## 2018-09-09 MED ORDER — HYDROMORPHONE HCL 1 MG/ML IJ SOLN
1.0000 mg | INTRAMUSCULAR | Status: DC | PRN
  Administered 2018-09-09 – 2018-09-10 (×5): 1 mg via INTRAVENOUS
  Filled 2018-09-09 (×5): qty 1

## 2018-09-09 MED ORDER — POTASSIUM CHLORIDE 10 MEQ/100ML IV SOLN
INTRAVENOUS | Status: AC
  Administered 2018-09-09: 10 meq
  Filled 2018-09-09: qty 200

## 2018-09-09 MED ORDER — HYDROMORPHONE HCL 1 MG/ML IJ SOLN
0.5000 mg | INTRAMUSCULAR | Status: DC | PRN
  Administered 2018-09-09 (×2): 0.5 mg via INTRAVENOUS
  Filled 2018-09-09 (×2): qty 1

## 2018-09-09 MED ORDER — POTASSIUM CHLORIDE 10 MEQ/100ML IV SOLN
10.0000 meq | INTRAVENOUS | Status: AC
  Administered 2018-09-09 (×5): 10 meq via INTRAVENOUS
  Filled 2018-09-09 (×4): qty 100

## 2018-09-09 MED ORDER — PROMETHAZINE HCL 25 MG/ML IJ SOLN
6.2500 mg | Freq: Four times a day (QID) | INTRAMUSCULAR | Status: AC | PRN
  Administered 2018-09-09 (×2): 6.25 mg via INTRAVENOUS
  Filled 2018-09-09 (×2): qty 1

## 2018-09-09 MED ORDER — METOCLOPRAMIDE HCL 5 MG/ML IJ SOLN
10.0000 mg | Freq: Once | INTRAMUSCULAR | Status: AC
  Administered 2018-09-09: 10 mg via INTRAVENOUS
  Filled 2018-09-09: qty 2

## 2018-09-09 MED ORDER — MAGNESIUM SULFATE 2 GM/50ML IV SOLN
2.0000 g | Freq: Once | INTRAVENOUS | Status: AC
  Administered 2018-09-09: 2 g via INTRAVENOUS
  Filled 2018-09-09: qty 50

## 2018-09-09 MED ORDER — PROMETHAZINE HCL 25 MG/ML IJ SOLN
12.5000 mg | Freq: Four times a day (QID) | INTRAMUSCULAR | Status: DC | PRN
  Administered 2018-09-09: 12.5 mg via INTRAVENOUS
  Filled 2018-09-09: qty 1

## 2018-09-09 NOTE — Progress Notes (Signed)
PROGRESS NOTE    Samuel Abbott  HAL:[id number] DOB: August 04, 1960 DOA: 09/08/2018 PCP: Norm Salt, PA    Chief complaint: Abdominal pain  Brief Narrative:   Samuel Abbott is a 58 y.o. male with medical history significant for acute pancreatitis, chronic alcohol abuse, who is admitted to [facility] long hospital on 09/08/2018 with acute alcoholic pancreatitis after presenting from home to the [facility] long emergency department complaining of abdominal pain.  The patient reports 1 day of progressive, constant, stabbing abdominal discomfort that he describes as diffuse in orientation, but most prominent over the epigastrium.  Reports exacerbation of the discomfort with palpation of the abdomen.  He conveys associated nausea resulting in 4-5 episodes of nonbloody, nonbilious emesis over that same timeframe.  He also notes one episode of loose stool, that occurred earlier today in the absence of any associated melena or hematochezia.  He denies any associated subjective fever, chills, rigors, or generalized myalgias.  Also denies any associated pain, shortness of breath, or rash.  No recent travel.  Denies any recent trauma.  The patient acknowledges a history of chronic alcohol abuse, in which he reports consumption of 8 shots of whiskey and 2 beers on a daily basis for approximately the last 20 years.  He reports 1 prior episode of acute pancreatitis, which he states occurred approximately 15 years ago.  In terms of recent changes to his outpatient medication regimen, the patient reports that, 3 weeks ago, he was prescribed prednisone 20 mg p.o. daily as needed for exacerbation of eczema.  Over the last 3 weeks, he reports that he is typically taken the prednisone once every 3 to 4 days.  Denies any recent use of antibiotics or thiazide diuretics.   ED Course: Vital signs in the ED were notable for the following: Temperature max 98; heart rate 73-90; blood pressure 168/100; respiratory rate 16-18;  and oxygen saturation 98-100% on room air.  Labs in the ED were notable for the following: CMP notable for sodium 136, potassium 3.6, creatinine 0.72, adjusted calcium 8.7, alkaline phosphatase 77, AST 89, ALT 83, and total bilirubin 1.4.  Lipase 218.  Serum ethanol level 32.  CT abdomen/pelvis, per final radiology report, showed inflammation and stranding associated with the head and tail the pancreas consistent with acute pancreatitis in the absence of any associated evidence of cyst or necrosis.  Additionally, CT abdomen/pelvis showed no evidence of gallbladder wall inflammation, dilation of common bile duct, or choledocholithiasis.  While in the ED, the following were administered: Fentanyl 50 mcg IV x1, Dilaudid 1 mg IV x1, morphine 4 mg IV x1, Zofran 4 mg IV x1, Reglan 10 mg IV x1, and 2 L IV normal saline bolus.  Subsequently, the patient was admitted to the MedSurg floor for further evaluation and management of suspected acute alcoholic pancreatitis.   Assessment & Plan:   Principal Problem:   Acute alcoholic pancreatitis Active Problems:   Abdominal pain   Nausea & vomiting   Tobacco abuse   Chronic alcohol abuse   Acute alcoholic versus hypertriglyceridemia pancreatitis With 1 day history of progressive, constant sharp abdominal discomfort localized to the epigastrium with radiation towards his back.  Worse while lying flat improved with sitting forward.  Patient with known history of excessive alcohol abuse in which he utilizes 8 shots of whiskey and 2 beers daily for the past 20 years.  One previous episode of pancreatitis roughly 15 years ago.  CT abdomen/pelvis notable for acute pancreatitis without necrosis or peripancreatic  fluid collection. --AST elevated to 89, ALT 83, EtOH 32, triglycerides 976, lipase 218 on admission --LFTs trending down --Continue IV fluid hydration --Repeat triglycerides this afternoon, if not downtrending may consider treatment with IV insulin until  triglycerides are less than 500. --Continue n.p.o. --Supportive care, pain control with Dilaudid 1 mg IV every 2 hours, antiemetics --Repeat CMP and lipase in the am, triglycerides every 12 hours --Will need to start gemfibrozil 600 mg p.o. twice daily once able to tolerate p.o. and triglyceride level improves with above treatment plan.  Hypo-kalemia Hypomagnesemia Potassium 3.1 and magnesium 1.5 today.  Will replete --Repeat electrolytes with magnesium in the morning  EtOH abuse Continues to use 8 shots of whiskey and 2 beers daily over the past 20 years.  Counseled on need for cessation. --Received IV banana bag on admission --Plan to start folic acid and multivitamin once tolerates p.o. --continue CIWAA protocol --Supportive care   DVT prophylaxis: Lovenox Code Status: Full code Family Communication: None Disposition Plan: Continue inpatient hospitalization, IV fluids, n.p.o., further depending on clinical course, anticipate discharge home when medically stable.   Consultants:  None  Procedures: None  Antimicrobials: None   Subjective: Patient seen and examined at bedside, lying comfortably in bed.  Asked when he can discharge home.  Continues n.p.o. with IV fluids.  Request transition to clear liquid diet.  Discussed with him that he will need to discontinue all pain medicines if he wants to eat, he agrees.  Later this afternoon he had significant abdominal pain recurrence with small sips of water and his n.p.o. status was reinitiated.  Pain continues to be localized to the epigastrium with radiation towards his back worse with lying flat and improved with sitting up.  No other complaints at this time.  Denies headache, no visual changes, no chest pain, shortness of breath, no palpitations, no fever/chills/night sweats, no issues with bowel/bladder function, no paresthesias.  No acute events overnight per nursing staff.  Objective: Vitals:   09/08/18 2035 09/09/18 0512  09/09/18 0617 09/09/18 1313  BP: (!) 177/91 (!) 166/106 (!) 149/85 (!) 181/107  Pulse: 74 98 96 93  Resp: (!) 24   20  Temp: (!) 97.5 F (36.4 C) 98.8 F (37.1 C)  98.8 F (37.1 C)  TempSrc: Oral Oral  Oral  SpO2: 100% 97% 97% 97%  Weight: 81.6 kg     Height: 6' (1.829 m)       Intake/Output Summary (Last 24 hours) at 09/09/2018 1529 Last data filed at 09/09/2018 0900 Gross per 24 hour  Intake 2309.54 ml  Output 2000 ml  Net 309.54 ml   Filed Weights   09/08/18 2035  Weight: 81.6 kg    Examination:  General exam: Appears calm and comfortable  Respiratory system: Clear to auscultation. Respiratory effort normal. Cardiovascular system: S1 & S2 heard, RRR. No JVD, murmurs, rubs, gallops or clicks. No pedal edema. Gastrointestinal system: Abdomen is nondistended, soft, mild/moderate epigastric tenderness. No organomegaly or masses felt. Normal bowel sounds heard. Central nervous system: Alert and oriented. No focal neurological deficits. Extremities: Symmetric 5 x 5 power. Skin: No rashes, lesions or ulcers Psychiatry: Judgement and insight appear normal. Mood & affect appropriate.     Data Reviewed: I have personally reviewed following labs and imaging studies  CBC: Recent Labs  Lab 09/08/18 1643 09/09/18 0335  WBC 11.1* 13.1*  HGB 15.8 14.5  HCT 45.4 43.6  MCV 92.7 95.0  PLT 166 118*   Basic Metabolic Panel: Recent Labs  Lab 09/08/18 1643 09/08/18 1712 09/09/18 0335  NA 136  --  136  K 3.6  --  3.1*  CL 102  --  104  CO2 17*  --  23  GLUCOSE 143*  --  134*  BUN 12  --  7  CREATININE 0.72  --  0.61  CALCIUM 8.7*  --  7.8*  MG  --  1.7 1.5*   GFR: Estimated Creatinine Clearance: 114.5 mL/min (by C-G formula based on SCr of 0.61 mg/dL). Liver Function Tests: Recent Labs  Lab 09/08/18 1643 09/09/18 0335  AST 89* 61*  ALT 83* 70*  ALKPHOS 77 72  BILITOT 1.4* 2.8*  PROT 6.6 6.6  ALBUMIN 4.0 4.0   Recent Labs  Lab 09/08/18 1643  LIPASE 218*    No results for input(s): AMMONIA in the last 168 hours. Coagulation Profile: No results for input(s): INR, PROTIME in the last 168 hours. Cardiac Enzymes: No results for input(s): CKTOTAL, CKMB, CKMBINDEX, TROPONINI in the last 168 hours. BNP (last 3 results) No results for input(s): PROBNP in the last 8760 hours. HbA1C: No results for input(s): HGBA1C in the last 72 hours. CBG: No results for input(s): GLUCAP in the last 168 hours. Lipid Profile: Recent Labs    09/09/18 0335  TRIG 976*   Thyroid Function Tests: No results for input(s): TSH, T4TOTAL, FREET4, T3FREE, THYROIDAB in the last 72 hours. Anemia Panel: No results for input(s): VITAMINB12, FOLATE, FERRITIN, TIBC, IRON, RETICCTPCT in the last 72 hours. Sepsis Labs: No results for input(s): PROCALCITON, LATICACIDVEN in the last 168 hours.  No results found for this or any previous visit (from the past 240 hour(s)).       Radiology Studies: Ct Abdomen Pelvis W Contrast  Result Date: 09/08/2018 CLINICAL DATA:  Abdominal pain 1 day EXAM: CT ABDOMEN AND PELVIS WITH CONTRAST TECHNIQUE: Multidetector CT imaging of the abdomen and pelvis was performed using the standard protocol following bolus administration of intravenous contrast. CONTRAST:  ISOVUE-300 IOPAMIDOL (ISOVUE-300) INJECTION 61% COMPARISON:  None. FINDINGS: Lower chest: No acute abnormality. Hepatobiliary: Diffuse hepatic steatosis.  Unremarkable gallbladder. Pancreas: There is stranding about the head and tail of the pancreas. This is compatible with pancreatitis. There is no evidence of pancreatic hemorrhage or pancreatic necrosis. Spleen: Unremarkable Adrenals/Urinary Tract: Kidneys and adrenal glands are unremarkable. Bladder is decompressed and within normal limits. Stomach/Bowel: Normal appendix. No obvious mass in the colon. Small bowel and stomach are decompressed. Vascular/Lymphatic: No abnormal retroperitoneal adenopathy. No evidence of aortic  aneurysm. Reproductive: Unremarkable prostate. Other: No free fluid. Musculoskeletal: No vertebral compression deformity. 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 L4-5 moderate facet arthropathy. 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 IMPRESSION: Findings above are consistent with acute pancreatitis. There is no acute peripancreatic fluid collection, pancreatic necrosis, or pancreatic hemorrhage. Diffuse hepatic steatosis. Electronically Signed   By: Jolaine Click M.D.   On: 09/08/2018 18:17        Scheduled Meds: . enoxaparin (LOVENOX) injection  40 mg Subcutaneous Q24H   Continuous Infusions: . sodium chloride 200 mL/hr at 09/09/18 0900  . magnesium sulfate 1 - 4 g bolus IVPB       LOS: 1 day    Time spent: 31 minutes    Alvira Philips [place], DO [facility] Pager [phone]  If 7PM-7AM, please contact night-coverage [email or web address] Password [facility] 09/09/2018, 3:29 PM

## 2018-09-09 NOTE — Progress Notes (Signed)
Pharmacy: phenergan T Bili >2 with liver impairment, max promethazine IV dose is 6.25 mg.  Plan: phenergan dose reduced to 6.25 for T bili 2.88  Herby Abraham, Pharm.D [phone] 09/09/2018 10:30 AM

## 2018-09-10 DIAGNOSIS — E781 Pure hyperglyceridemia: Secondary | ICD-10-CM | POA: Diagnosis present

## 2018-09-10 DIAGNOSIS — E785 Hyperlipidemia, unspecified: Secondary | ICD-10-CM | POA: Diagnosis present

## 2018-09-10 LAB — COMPREHENSIVE METABOLIC PANEL
ALBUMIN: 2.9 g/dL — AB (ref 3.5–5.0)
ALT: 39 U/L (ref 0–44)
AST: 37 U/L (ref 15–41)
Alkaline Phosphatase: 63 U/L (ref 38–126)
Anion gap: 8 (ref 5–15)
BUN: 8 mg/dL (ref 6–20)
CO2: 23 mmol/L (ref 22–32)
Calcium: 7.4 mg/dL — ABNORMAL LOW (ref 8.9–10.3)
Chloride: 101 mmol/L (ref 98–111)
Creatinine, Ser: 0.83 mg/dL (ref 0.61–1.24)
GFR calc Af Amer: >60 mL/min (ref >60)
GFR calc non Af Amer: >60 mL/min (ref >60)
GLUCOSE: 80 mg/dL (ref 70–99)
Potassium: 3.6 mmol/L (ref 3.5–5.1)
SODIUM: 132 mmol/L — AB (ref 135–145)
Total Bilirubin: 2.9 mg/dL — ABNORMAL HIGH (ref 0.3–1.2)
Total Protein: 5.6 g/dL — ABNORMAL LOW (ref 6.5–8.1)

## 2018-09-10 LAB — CBC
HCT: 39.1 % (ref 39.0–52.0)
Hemoglobin: 12.5 g/dL — ABNORMAL LOW (ref 13.0–17.0)
MCH: 31.7 pg (ref 26.0–34.0)
MCHC: 32 g/dL (ref 30.0–36.0)
MCV: 99.2 fL (ref 80.0–100.0)
Platelets: 80 10*3/uL — ABNORMAL LOW (ref 150–400)
RBC: 3.94 MIL/uL — ABNORMAL LOW (ref 4.22–5.81)
RDW: 13.5 % (ref 11.5–15.5)
WBC: 11.9 10*3/uL — ABNORMAL HIGH (ref 4.0–10.5)
nRBC: 0 % (ref 0.0–0.2)

## 2018-09-10 LAB — TRIGLYCERIDES: Triglycerides: 283 mg/dL — ABNORMAL HIGH (ref <150)

## 2018-09-10 LAB — LIPASE, BLOOD: Lipase: 153 U/L — ABNORMAL HIGH (ref 11–51)

## 2018-09-10 LAB — HIV ANTIBODY (ROUTINE TESTING W REFLEX): HIV Screen 4th Generation wRfx: NONREACTIVE

## 2018-09-10 LAB — MAGNESIUM: Magnesium: 2 mg/dL (ref 1.7–2.4)

## 2018-09-10 MED ORDER — GEMFIBROZIL 600 MG PO TABS: 600.0000 mg | ORAL_TABLET | Freq: Two times a day (BID) | ORAL | 0 refills | Status: DC

## 2018-09-10 NOTE — Discharge Summary (Signed)
Physician Discharge Summary  Samuel Abbott:[id number] DOB: 10-18-1960 DOA: 09/08/2018  PCP: Norm Salt, PA  Admit date: 09/08/2018 Discharge date: 09/10/2018  Admitted From: Home Disposition:  Home  Recommendations for Outpatient Follow-up:  1. Follow up with PCP in 1 weeks 2. Will need further surveillance of his lipid profile, new started on gemfibrozil for hypertriglyceridemia  Home Health: No Equipment/Devices: None  Discharge Condition: Stable CODE STATUS: Full code Diet recommendation: Heart healthy, low-fat  Chief complaint: Abdominal pain  History of present illness:  Samuel Abbott a 58 y.o.malewith medical history significant foracute pancreatitis, chronic alcohol abuse, who is admitted to [facility] long hospital on 09/08/2018 with acute alcoholic pancreatitis after presenting from home to the [facility] long emergency department complaining of abdominal pain.  The patient reports 1 day of progressive, constant, stabbing abdominal discomfort that he describes as diffuse in orientation, but most prominent over the epigastrium. Reports exacerbation of the discomfort with palpation of the abdomen. He conveys associated nausea resulting in 4-5 episodes of nonbloody, nonbilious emesis over that same timeframe. He also notes one episode of loose stool, that occurred earlier today in the absence of any associated melena or hematochezia. He denies any associated subjective fever, chills, rigors, or generalized myalgias. Also denies any associated pain, shortness of breath, or rash. No recent travel. Denies any recent trauma. The patient acknowledges a history of chronic alcohol abuse, in which he reports consumption of 8 shots of whiskey and 2 beers on a daily basis for approximately the last 20 years. He reports 1 prior episode of acute pancreatitis, which he states occurred approximately 15 years ago. In terms of recent changes to his outpatient medication regimen,  the patient reports that,3 weeks ago, he was prescribed prednisone 20 mg p.o. daily as needed for exacerbation of eczema.Over the last 3 weeks, he reports that he is typically taken the prednisone once every 3 to 4 days.Denies any recent use of antibiotics or thiazide diuretics.   ED Course:Vital signs in the ED were notable for the following: Temperature max 98; heart rate 73-90; blood pressure 168/100; respiratory rate 16-18; and oxygen saturation 98-100% on room air. Labs in the ED were notable for the following: CMP notable for sodium 136, potassium 3.6, creatinine 0.72, adjusted calcium 8.7, alkaline phosphatase 77, AST 89, ALT 83, and total bilirubin 1.4. Lipase 218.Serum ethanol level 32.  CT abdomen/pelvis, per final radiology report, showed inflammation and stranding associated with the head and tail the pancreas consistent with acute pancreatitis in the absence of any associated evidence of cyst or necrosis.Additionally, CT abdomen/pelvis showed no evidence of gallbladder wall inflammation, dilation of common bile duct, or choledocholithiasis.  While in the ED, the following were administered: Fentanyl 50 mcg IV x1, Dilaudid 1 mg IV x1, morphine 4 mg IV x1, Zofran 4 mg IV x1, Reglan 10 mg IV x1, and 2 L IV normal saline bolus. Subsequently, the patient was admitted to the MedSurg floor for further evaluation and management of suspected acute alcoholic pancreatitis.  Hospital course:  Acute alcoholic versus hypertriglyceridemia pancreatitis Presenting with 1 day progressive, constant sharp abdominal discomfort localized to the epigastrium with radiation towards his back.  Worse while lying flat improved with sitting forward.  Patient with known history of excessive alcohol abuse in which he utilizes 8 shots of whiskey and 2 beers daily for the past 20 years.  One previous episode of pancreatitis roughly 15 years ago.  CT abdomen/pelvis notable for acute pancreatitis without  necrosis or  peripancreatic fluid collection. AST elevated to 89, ALT 83, EtOH 32, triglycerides 976, lipase 218 on admission.  Patient was kept n.p.o. for bowel rest and started on IV fluid hydration with normal saline at 200 mL's per hour.  Patient's abdominal pain was managed with IV Dilaudid.  His pain has been well controlled with decreased symptoms.  He was transitioned to a clear liquid diet which he tolerated and eventually advanced to a soft diet without issue.  His triglycerides trended down to a low of 218.  Patient request discharge home at this time.  He was started on gemfibrozil 600 mg p.o. twice daily.  Recommending follow-up with his PCP for further surveillance of his lipid profile.  Discussed with him the need to maintain a low-salt diet with low fat.  As well as discussed with him extensively during hospitalization need for complete alcohol cessation.  Hypokalemia Hypomagnesemia Potassium 3.1 and magnesium 1.5 today.    Pleated during hospitalization.  Potassium and magnesium were normal at time of discharge.  EtOH abuse Continues to use 8 shots of whiskey and 2 beers daily over the past 20 years.  Counseled on need for cessation. Received IV banana bag on admission.  Was monitored with CIWAA score without any significant withdrawal symptoms during his hospitalization.  Recommend alcohol cessation completely following hospitalization.  Hypertriglyceridemia Triglycerides elevated to 976 on admission.  Patient did not require any a pheresis or IV insulin, supported with IV fluid hydration with triglycerides trending down to 218 at time of discharge.  Will start on gemfibrozil 600 mg p.o. twice daily.  Recommend low-fat diet.  Recommend further follow-up with PCP following discharge.  Discharge Diagnoses:  Principal Problem:   Acute alcoholic pancreatitis Active Problems:   Nausea & vomiting   Tobacco abuse   Chronic alcohol abuse   Hypertriglyceridemia    Discharge  Instructions  Discharge Instructions    Call MD for:  persistant nausea and vomiting   Complete by:  As directed    Call MD for:  severe uncontrolled pain   Complete by:  As directed    Call MD for:  temperature >100.4   Complete by:  As directed    Diet - low sodium heart healthy   Complete by:  As directed    Increase activity slowly   Complete by:  As directed      Allergies as of 09/10/2018      Reactions   Penicillins       Medication List    STOP taking these medications   predniSONE 20 MG tablet Commonly known as:  DELTASONE     TAKE these medications   gemfibrozil 600 MG tablet Commonly known as:  LOPID Take 1 tablet (600 mg total) by mouth 2 (two) times daily for 30 days.   testosterone cypionate 200 MG/ML injection Commonly known as:  DEPOTESTOSTERONE CYPIONATE Inject 200 mg into the muscle every 28 (twenty-eight) days.      Follow-up Information    Norm Salt, [place]. Schedule an appointment as soon as possible for a visit in 1 week(s).   Specialty:  Physician Assistant Contact information: [address] [place] [address] [phone]          Allergies  Allergen Reactions  . Penicillins     Consultations:  none   Procedures/Studies: Ct Abdomen Pelvis W Contrast  Result Date: 09/08/2018 CLINICAL DATA:  Abdominal pain 1 day EXAM: CT ABDOMEN AND PELVIS WITH CONTRAST TECHNIQUE: Multidetector CT imaging of the  abdomen and pelvis was performed using the standard protocol following bolus administration of intravenous contrast. CONTRAST:  ISOVUE-300 IOPAMIDOL (ISOVUE-300) INJECTION 61% COMPARISON:  None. FINDINGS: Lower chest: No acute abnormality. Hepatobiliary: Diffuse hepatic steatosis.  Unremarkable gallbladder. Pancreas: There is stranding about the head and tail of the pancreas. This is compatible with pancreatitis. There is no evidence of pancreatic hemorrhage or pancreatic necrosis. Spleen: Unremarkable Adrenals/Urinary  Tract: Kidneys and adrenal glands are unremarkable. Bladder is decompressed and within normal limits. Stomach/Bowel: Normal appendix. No obvious mass in the colon. Small bowel and stomach are decompressed. Vascular/Lymphatic: No abnormal retroperitoneal adenopathy. No evidence of aortic aneurysm. Reproductive: Unremarkable prostate. Other: No free fluid. Musculoskeletal: No vertebral compression deformity. 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 L4-5 moderate facet arthropathy. 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 IMPRESSION: Findings above are consistent with acute pancreatitis. There is no acute peripancreatic fluid collection, pancreatic necrosis, or pancreatic hemorrhage. Diffuse hepatic steatosis. Electronically Signed   By: Jolaine Click M.D.   On: 09/08/2018 18:17     Subjective: Patient seen and examined at bedside, abdominal pain resolved.  Tolerating advanced diet.  Request discharge home.  No other complaints at this time.  Denies headache, no visual changes, no nausea/vomiting/diarrhea, no chest pain, no palpitations, no shortness of breath, no weakness, no cough/congestion, no fever/chills/night sweats.  No acute events overnight per nursing staff.   Discharge Exam: Vitals:   09/10/18 0150 09/10/18 0620  BP: (!) 147/82 (!) 143/81  Pulse: 98 (!) 107  Resp: 17 19  Temp: 99.8 F (37.7 C) 100.2 F (37.9 C)  SpO2: 96% 93%   Vitals:   09/09/18 1313 09/09/18 2038 09/10/18 0150 09/10/18 0620  BP: (!) 181/107 (!) 148/96 (!) 147/82 (!) 143/81  Pulse: 93 (!) 107 98 (!) 107  Resp: 20 18 17 19   Temp: 98.8 F (37.1 C) 98 F (36.7 C) 99.8 F (37.7 C) 100.2 F (37.9 C)  TempSrc: Oral Oral Oral Oral  SpO2: 97% 94% 96% 93%  Weight:    79 kg  Height:        General: Pt is alert, awake, not in acute distress Cardiovascular: RRR, S1/S2 +, no rubs, no gallops Respiratory: CTA bilaterally, no wheezing, no rhonchi Abdominal: Soft,  NT, ND, bowel sounds + Extremities: no edema, no cyanosis    The results of significant diagnostics from this hospitalization (including imaging, microbiology, ancillary and laboratory) are listed below for reference.     Microbiology: No results found for this or any previous visit (from the past 240 hour(s)).   Labs: BNP (last 3 results) No results for input(s): BNP in the last 8760 hours. Basic Metabolic Panel: Recent Labs  Lab 09/08/18 1643 09/08/18 1712 09/09/18 0335 09/10/18 0401  NA 136  --  136 132*  K 3.6  --  3.1* 3.6  CL 102  --  104 101  CO2 17*  --  23 23  GLUCOSE 143*  --  134* 80  BUN 12  --  7 8  CREATININE 0.72  --  0.61 0.83  CALCIUM 8.7*  --  7.8* 7.4*  MG  --  1.7 1.5* 2.0   Liver Function Tests: Recent Labs  Lab 09/08/18 1643 09/09/18 0335 09/10/18 0401  AST 89* 61* 37  ALT 83* 70* 39  ALKPHOS 77 72 63  BILITOT 1.4* 2.8* 2.9*  PROT 6.6 6.6 5.6*  ALBUMIN 4.0 4.0 2.9*   Recent Labs  Lab 09/08/18  1643 09/09/18 1531 09/10/18 0720  LIPASE 218* 315* 153*   No results for input(s): AMMONIA in the last 168 hours. CBC: Recent Labs  Lab 09/08/18 1643 09/09/18 0335 09/10/18 0401  WBC 11.1* 13.1* 11.9*  HGB 15.8 14.5 12.5*  HCT 45.4 43.6 39.1  MCV 92.7 95.0 99.2  PLT 166 118* 80*   Cardiac Enzymes: No results for input(s): CKTOTAL, CKMB, CKMBINDEX, TROPONINI in the last 168 hours. BNP: Invalid input(s): POCBNP CBG: No results for input(s): GLUCAP in the last 168 hours. D-Dimer No results for input(s): DDIMER in the last 72 hours. Hgb A1c No results for input(s): HGBA1C in the last 72 hours. Lipid Profile Recent Labs    09/09/18 1531 09/10/18 0401  TRIG 385* 283*   Thyroid function studies No results for input(s): TSH, T4TOTAL, T3FREE, THYROIDAB in the last 72 hours.  Invalid input(s): FREET3 Anemia work up No results for input(s): VITAMINB12, FOLATE, FERRITIN, TIBC, IRON, RETICCTPCT in the last 72 hours. Urinalysis     Component Value Date/Time   COLORURINE STRAW (A) 09/08/2018 2018   APPEARANCEUR CLEAR 09/08/2018 2018   LABSPEC 1.029 09/08/2018 2018   PHURINE 7.0 09/08/2018 2018   GLUCOSEU NEGATIVE 09/08/2018 2018   HGBUR SMALL (A) 09/08/2018 2018   BILIRUBINUR NEGATIVE 09/08/2018 2018   KETONESUR 5 (A) 09/08/2018 2018   PROTEINUR NEGATIVE 09/08/2018 2018   NITRITE NEGATIVE 09/08/2018 2018   LEUKOCYTESUR NEGATIVE 09/08/2018 2018   Sepsis Labs Invalid input(s): PROCALCITONIN,  WBC,  LACTICIDVEN Microbiology No results found for this or any previous visit (from the past 240 hour(s)).   Time coordinating discharge: Over 30 minutes  SIGNED:   Alvira Philips [place], DO  [facility] 09/10/2018, 12:43 PM

## 2018-09-10 NOTE — Discharge Instructions (Signed)
Pancreatitis Eating Plan Pancreatitis is when your pancreas becomes irritated and swollen (inflamed). The pancreas is a small organ located behind your stomach. It helps your body digest food and regulate your blood sugar. Pancreatitis can affect how your body digests food, especially foods with fat. You may also have other symptoms such as abdominal pain or nausea. When you have pancreatitis, following a low-fat eating plan may help you manage symptoms and recover more quickly. Work with your health care provider or a diet and nutrition specialist (dietitian) to create an eating plan that is right for you. What are tips for following this plan? Reading food labels Use the information on food labels to help keep track of how much fat you eat:  Check the serving size.  Look for the amount of total fat in grams (g) in one serving. ? Low-fat foods have 3 g of fat or less per serving. ? Fat-free foods have 0.5 g of fat or less per serving.  Keep track of how much fat you eat based on how many servings you eat. ? For example, if you eat two servings, the amount of fat you eat will be two times what is listed on the label. Shopping   Buy low-fat or nonfat foods, such as: ? Fresh, frozen, or canned fruits and vegetables. ? Grains, including pasta, bread, and rice. ? Lean meat, poultry, fish, and other protein foods. ? Low-fat or nonfat dairy.  Avoid buying bakery products and other sweets made with whole milk, butter, and eggs.  Avoid buying snack foods with added fat, such as anything with butter or cheese flavoring. Cooking  Remove skin from poultry, and remove extra fat from meat.  Limit the amount of fat and oil you use to 6 teaspoons or less per day.  Cook using low-fat methods, such as boiling, broiling, grilling, steaming, or baking.  Use spray oil to cook. Add fat-free chicken broth to add flavor and moisture.  Avoid adding cream to thicken soups or sauces. Use other  thickeners such as corn starch or tomato paste. Meal planning   Eat a low-fat diet as told by your dietitian. For most people, this means having no more than 55-65 grams of fat each day.  Eat small, frequent meals throughout the day. For example, you may have 5-6 small meals instead of 3 large meals.  Drink enough fluid to keep your urine pale yellow.  Do not drink alcohol. Talk to your health care provider if you need help stopping.  Limit how much caffeine you have, including black coffee, black and green tea, caffeinated soft drinks, and energy drinks. General information  Let your health care provider or dietitian know if you have unplanned weight loss on this eating plan.  You may be instructed to follow a clear liquid diet during a flare of symptoms. Talk with your health care provider about how to manage your diet during symptoms of a flare.  Take any vitamins or supplements as told by your health care provider.  Work with a Microbiologist, especially if you have other conditions such as obesity or diabetes mellitus. What foods should I avoid? Fruits Fried fruits. Fruits served with butter or cream. Vegetables Fried vegetables. Vegetables cooked with butter, cheese, or cream. Grains Biscuits, waffles, donuts, pastries, and croissants. Pies and cookies. Butter-flavored popcorn. Regular crackers. Meats and other protein foods Fatty cuts of meat. Poultry with skin. Organ meats. Bacon, sausage, and cold cuts. Whole eggs. Nuts and nut butters. Dairy Whole  and 2% milk. Whole milk yogurt. Whole milk ice cream. Cream and half-and-half. Cream cheese. Sour cream. Cheese. Beverages Wine, beer, and liquor. The items listed above may not be a complete list of foods and beverages to avoid. Contact a dietitian for more information. Summary  Pancreatitis can affect how your body digests food, especially foods with fat.  When you have pancreatitis, it is recommended that you follow a low-fat  eating plan to help you recover more quickly and manage symptoms. For most people, this means limiting fat to no more than 55-65 grams per day.  Do not drink alcohol. Limit the amount of caffeine you have, and drink enough fluid to keep your urine pale yellow. This information is not intended to replace advice given to you by your health care provider. Make sure you discuss any questions you have with your health care provider. Document Released: 10/05/2017 Document Revised: 10/05/2017 Document Reviewed: 10/05/2017 Elsevier Interactive Patient Education  2019 [employer].  Alcohol Use Disorder Alcohol use disorder is when your drinking disrupts your daily life. When you have this condition, you drink too much alcohol and you cannot control your drinking. Alcohol use disorder can cause serious problems with your physical health. It can affect your brain, heart, liver, pancreas, immune system, stomach, and intestines. Alcohol use disorder can increase your risk for certain cancers and cause problems with your mental health, such as depression, anxiety, psychosis, delirium, and dementia. People with this disorder risk hurting themselves and others. What are the causes? This condition is caused by drinking too much alcohol over time. It is not caused by drinking too much alcohol only one or two times. Some people with this condition drink alcohol to cope with or escape from negative life events. Others drink to relieve pain or symptoms of mental illness. What increases the risk? You are more likely to develop this condition if:  You have a family history of alcohol use disorder.  Your culture encourages drinking to the point of intoxication, or makes alcohol easy to get.  You had a mood or conduct disorder in childhood.  You have been a victim of abuse.  You are an adolescent and: ? You have poor grades or difficulties in school. ? Your caregivers do not talk to you about saying no to alcohol,  or supervise your activities. ? You are impulsive or you have trouble with self-control. What are the signs or symptoms? Symptoms of this condition include:  Drinkingmore than you want to.  Drinking for longer than you want to.  Trying several times to drink less or to control your drinking.  Spending a lot of time getting alcohol, drinking, or recovering from drinking.  Craving alcohol.  Having problems at work, at school, or at home due to drinking.  Having problems in relationships due to drinking.  Drinking when it is dangerous to drink, such as before driving a car.  Continuing to drink even though you know you might have a physical or mental problem related to drinking.  Needing more and more alcohol to get the same effect you want from the alcohol (building up tolerance).  Having symptoms of withdrawal when you stop drinking. Symptoms of withdrawal include: ? Fatigue. ? Nightmares. ? Trouble sleeping. ? Depression. ? Anxiety. ? Fever. ? Seizures. ? Severe confusion. ? Feeling or seeing things that are not there (hallucinations). ? Tremors. ? Rapid heart rate. ? Rapid breathing. ? High blood pressure.  Drinking to avoid symptoms of withdrawal. How is  this diagnosed? This condition is diagnosed with an assessment. Your health care provider may start the assessment by asking three or four questions about your drinking. Your health care provider may perform a physical exam or do lab tests to see if you have physical problems resulting from alcohol use. She or he may refer you to a mental health professional for evaluation. How is this treated? Some people with alcohol use disorder are able to reduce their alcohol use to low-risk levels. Others need to completely quit drinking alcohol. When necessary, mental health professionals with specialized training in substance use treatment can help. Your health care provider can help you decide how severe your alcohol use  disorder is and what type of treatment you need. The following forms of treatment are available:  Detoxification. Detoxification involves quitting drinking and using prescription medicines within the first week to help lessen withdrawal symptoms. This treatment is important for people who have had withdrawal symptoms before and for heavy drinkers who are likely to have withdrawal symptoms. Alcohol withdrawal can be dangerous, and in severe cases, it can cause death. Detoxification may be provided in a home, community, or primary care setting, or in a hospital or substance use treatment facility.  Counseling. This treatment is also called talk therapy. It is provided by substance use treatment counselors. A counselor can address the reasons you use alcohol and suggest ways to keep you from drinking again or to prevent problem drinking. The goals of talk therapy are to: ? Find healthy activities and ways for you to cope with stress. ? Identify and avoid the things that trigger your alcohol use. ? Help you learn how to handle cravings.  Medicines.Medicines can help treat alcohol use disorder by: ? Decreasing alcohol cravings. ? Decreasing the positive feeling you have when you drink alcohol. ? Causing an uncomfortable physical reaction when you drink alcohol (aversion therapy).  Support groups. Support groups are led by people who have quit drinking. They provide emotional support, advice, and guidance. These forms of treatment are often combined. Some people with this condition benefit from a combination of treatments provided by specialized substance use treatment centers. Follow these instructions at home:  Take over-the-counter and prescription medicines only as told by your health care provider.  Check with your health care provider before starting any new medicines.  Ask friends and family members not to offer you alcohol.  Avoid situations where alcohol is served, including gatherings  where others are drinking alcohol.  Create a plan for what to do when you are tempted to use alcohol.  Find hobbies or activities that you enjoy that do not include alcohol.  Keep all follow-up visits as told by your health care provider. This is important. How is this prevented?  If you drink, limit alcohol intake to no more than 1 drink a day for nonpregnant women and 2 drinks a day for men. One drink equals 12 oz of beer, 5 oz of wine, or 1 oz of hard liquor.  If you have a mental health condition, get treatment and support.  Do not give alcohol to adolescents.  If you are an adolescent: ? Do not drink alcohol. ? Do not be afraid to say no if someone offers you alcohol. Speak up about why you do not want to drink. You can be a positive role model for your friends and set a good example for those around you by not drinking alcohol. ? If your friends drink, spend time with others  who do not drink alcohol. Make new friends who do not use alcohol. ? Find healthy ways to manage stress and emotions, such as meditation or deep breathing, exercise, spending time in nature, listening to music, or talking with a trusted friend or family member. Contact a health care provider if:  You are not able to take your medicines as told.  Your symptoms get worse.  You return to drinking alcohol (relapse) and your symptoms get worse. Get help right away if:  You have thoughts about hurting yourself or others. If you ever feel like you may hurt yourself or others, or have thoughts about taking your own life, get help right away. You can go to your nearest emergency department or call:  Your local emergency services (911 in the [place]  A suicide crisis helpline, such as the National Suicide Prevention Lifeline at (918)169-0199. This is open 24 hours a day. Summary  Alcohol use disorder is when your drinking disrupts your daily life. When you have this condition, you drink too much alcohol and you  cannot control your drinking.  Treatment may include detoxification, counseling, medicine, and support groups.  Ask friends and family members not to offer you alcohol. Avoid situations where alcohol is served.  Get help right away if you have thoughts about hurting yourself or others. This information is not intended to replace advice given to you by your health care provider. Make sure you discuss any questions you have with your health care provider. Document Released: 08/06/2004 Document Revised: 03/26/2016 Document Reviewed: 03/26/2016 Elsevier Interactive Patient Education  2019 [employer]  High Triglycerides Eating Plan Triglycerides are a type of fat in the blood. High levels of triglycerides can increase your risk of heart disease and stroke. If your triglyceride levels are high, choosing the right foods can help lower your triglycerides and keep your heart healthy. Work with your health care provider or a diet and nutrition specialist (dietitian) to develop an eating plan that is right for you. What are tips for following this plan? General guidelines   Lose weight, if you are overweight. For most people, losing 5-10 lbs (2-5 kg) helps lower triglyceride levels. A weight-loss plan may include. ? 30 minutes of exercise at least 5 days a week. ? Reducing the amount of calories, sugar, and fat you eat.  Eat a wide variety of fresh fruits, vegetables, and whole grains. These foods are high in fiber.  Eat foods that contain healthy fats, such as fatty fish, nuts, seeds, and olive oil.  Avoid foods that are high in added sugar, added salt (sodium), saturated fat, and trans fat.  Avoid low-fiber, refined carbohydrates such as white bread, crackers, noodles, and white rice.  Avoid foods with partially hydrogenated oils (trans fats), such as fried foods or stick margarine.  Limit alcohol intake to no more than 1 drink a day for nonpregnant women and 2 drinks a day for men. One  drink equals 12 oz of beer, 5 oz of wine, or 1 oz of hard liquor. Your health care provider may recommend that you drink less depending on your overall health. Reading food labels  Check food labels for the amount of saturated fat. Choose foods with no or very little saturated fat.  Check food labels for the amount of trans fat. Choose foods with no trans fat.  Check food labels for the amount of cholesterol. Choose foods low in cholesterol. Ask your dietitian how much cholesterol you should have each day.  Check  food labels for the amount of sodium. Choose foods with less than 140 milligrams (mg) per serving. Shopping  Buy dairy products labeled as nonfat (skim) or low-fat (1%).  Avoid buying processed or prepackaged foods. These are often high in added sugar, sodium, and fat. Cooking  Choose healthy fats when cooking, such as olive oil or canola oil.  Cook foods using lower fat methods, such as baking, broiling, boiling, or grilling.  Make your own sauces, dressings, and marinades when possible, instead of buying them. Store-bought sauces, dressings, and marinades are often high in sodium and sugar. Meal planning  Eat more home-cooked food and less restaurant, buffet, and fast food.  Eat fatty fish at least 2 times each week. Examples of fatty fish include salmon, trout, mackerel, tuna, and herring.  If you eat whole eggs, do not eat more than 3 egg yolks per week. What foods are recommended? The items listed may not be a complete list. Talk with your dietitian about what dietary choices are best for you. Grains Whole wheat or whole grain breads, crackers, cereals, and pasta. Unsweetened oatmeal. Bulgur. Barley. Quinoa. Brown rice. Whole wheat flour tortillas. Vegetables Fresh or frozen vegetables. Low-sodium canned vegetables. Fruits All fresh, canned (in natural juice), or frozen fruits. Meats and other protein foods Skinless chicken or [place]. Ground chicken or [place].  Lean cuts of pork, trimmed of fat. Fish and seafood, especially salmon, trout, and herring. Egg whites. Dried beans, peas, or lentils. Unsalted nuts or seeds. Unsalted canned beans. Natural peanut or almond butter. Dairy Low-fat dairy products. Skim or low-fat (1%) milk. Reduced fat (2%) and low-sodium cheese. Low-fat ricotta cheese. Low-fat cottage cheese. Plain, low-fat yogurt. Fats and oils Tub margarine without trans fats. Light or reduced-fat mayonnaise. Light or reduced-fat salad dressings. Avocado. Safflower, olive, sunflower, soybean, and canola oils. What foods are not recommended? The items listed may not be a complete list. Talk with your dietitian about what dietary choices are best for you. Grains White bread. White (regular) pasta. White rice. Cornbread. Bagels. Pastries. Crackers that contain trans fat. Vegetables Creamed or fried vegetables. Vegetables in a cheese sauce. Fruits Sweetened dried fruit. Canned fruit in syrup. Fruit juice. Meats and other protein foods Fatty cuts of meat. Ribs. Chicken wings. Tomasa Blase. Sausage. Bologna. Salami. Chitterlings. Fatback. Hot dogs. Bratwurst. Packaged lunch meats. Dairy Whole or reduced-fat (2%) milk. Half-and-half. Cream cheese. Full-fat or sweetened yogurt. Full-fat cheese. Nondairy creamers. Whipped toppings. Processed cheese or cheese spreads. Cheese curds. Beverages Alcohol. Sweetened drinks, such as soda, lemonade, fruit drinks, or punches. Fats and oils Butter. Stick margarine. Lard. Shortening. Ghee. Bacon fat. Tropical oils, such as coconut, palm kernel, or palm oils. Sweets and desserts Corn syrup. Sugars. Honey. Molasses. Candy. Jam and jelly. Syrup. Sweetened cereals. Cookies. Pies. Cakes. Donuts. Muffins. Ice cream. Condiments Store-bought sauces, dressings, and marinades that are high in sugar, such as ketchup and barbecue sauce. Summary  High levels of triglycerides can increase the risk of heart disease and stroke.  Choosing the right foods can help lower your triglycerides.  Eat plenty of fresh fruits, vegetables, and whole grains. Choose low-fat dairy and lean meats. Eat fatty fish at least twice a week.  Avoid processed and prepackaged foods with added sugar, sodium, saturated fat, and trans fat.  If you need suggestions or have questions about what types of food are good for you, talk with your health care provider or a dietitian. This information is not intended to replace advice given to you by your  health care provider. Make sure you discuss any questions you have with your health care provider. Document Released: 04/16/2004 Document Revised: 09/01/2016 Document Reviewed: 09/01/2016 Elsevier Interactive Patient Education  2019 [employer] Acute Pancreatitis  The pancreas is a gland that is located behind the stomach on the left side of the abdomen. It produces enzymes that help to digest food. The pancreas also releases the hormones glucagon and insulin, which help to regulate blood sugar. Acute pancreatitis happens when inflammation of the pancreas suddenly occurs and the pancreas becomes irritated and swollen. Most acute attacks last a few days and cause serious problems. Some people become dehydrated and develop low blood pressure. In severe cases, bleeding in the abdomen can lead to shock and can be life-threatening. The lungs, heart, and kidneys may fail. What are the causes? This condition may be caused by:  Alcohol abuse.  Drug abuse.  Gallstones or other conditions that can block the tube that drains the pancreas (pancreatic duct).  A tumor in the pancreas. Other causes include:  Certain medicines.  Exposure to certain chemicals.  Diabetes.  An infection in the pancreas.  Damage caused by an accident (trauma).  The poison (venom) from a scorpion bite.  Abdominal surgery.  Autoimmune pancreatitis. This is when the body's disease-fighting (immune) system attacks the  pancreas.  Genes that are passed from parent to child (inherited). In some cases, the cause of this condition is not known. What are the signs or symptoms? Symptoms of this condition include:  Pain in the upper abdomen that may radiate to the back. Pain may be severe.  Tenderness and swelling of the abdomen.  Nausea and vomiting.  Fever. How is this diagnosed? This condition may be diagnosed based on:  A physical exam.  Blood tests.  Imaging tests, such as X-rays, CT or MRI scans, or an ultrasound of the abdomen. How is this treated? Treatment for this condition usually requires a stay in the hospital. Treatment for this condition may include:  Pain medicine.  Fluid replacement through an IV.  Placing a tube in the stomach to remove stomach contents and to control vomiting (NG tube, or nasogastric tube).  Not eating for 3-4 days. This gives the pancreas a rest, because enzymes are not being produced that can cause further damage.  Antibiotic medicines, if your condition is caused by an infection.  Treating any underlying conditions that may be the cause.  Steroid medicines, if your condition is caused by your immune system attacking your body's own tissues (autoimmune disease).  Surgery on the pancreas or gallbladder. Follow these instructions at home: Eating and drinking   Follow instructions from your health care provider about diet. This may involve avoiding alcohol and decreasing the amount of fat in your diet.  Eat smaller, more frequent meals. This reduces the amount of digestive fluids that the pancreas produces.  Drink enough fluid to keep your urine pale yellow.  Do not drink alcohol if it caused your condition. General instructions  Take over-the-counter and prescription medicines only as told by your health care provider.  Do not drive or use heavy machinery while taking prescription pain medicine.  Ask your health care provider if the medicine  prescribed to you can cause constipation. You may need to take steps to prevent or treat constipation, such as: ? Take an over-the-counter or prescription medicine for constipation. ? Eat foods that are high in fiber such as whole grains and beans. ? Limit foods that are high in fat  and processed sugars, such as fried or sweet foods.  Do not use any products that contain nicotine or tobacco, such as cigarettes, e-cigarettes, and chewing tobacco. If you need help quitting, ask your health care provider.  Get plenty of rest.  If directed, check your blood sugar at home as told by your health care provider.  Keep all follow-up visits as told by your health care provider. This is important. Contact a health care provider if you:  Do not recover as quickly as expected.  Develop new or worsening symptoms.  Have persistent pain, weakness, or nausea.  Recover and then have another episode of pain.  Have a fever. Get help right away if:  You cannot eat or keep fluids down.  Your pain becomes severe.  Your skin or the white part of your eyes turns yellow (jaundice).  You have sudden swelling in your abdomen.  You vomit.  You feel dizzy or you faint.  Your blood sugar is high (over 300 mg/dL). Summary  Acute pancreatitis happens when inflammation of the pancreas suddenly occurs and the pancreas becomes irritated and swollen.  This condition is typically caused by alcohol abuse, drug abuse, or gallstones.  Treatment for this condition usually requires a stay in the hospital. This information is not intended to replace advice given to you by your health care provider. Make sure you discuss any questions you have with your health care provider. Document Released: 06/29/2005 Document Revised: 01/03/2018 Document Reviewed: 01/03/2018 Elsevier Interactive Patient Education  2019 [employer].

## 2021-01-22 ENCOUNTER — Inpatient Hospital Stay (HOSPITAL_COMMUNITY)
Admission: EM | Admit: 2021-01-22 | Discharge: 2021-01-24 | DRG: 439 | Disposition: A | Discharge status: Home / Self Care | Payer: No Typology Code available for payment source | Attending: Family Medicine | Admitting: Family Medicine

## 2021-01-22 ENCOUNTER — Emergency Department (HOSPITAL_COMMUNITY): Payer: No Typology Code available for payment source

## 2021-01-22 ENCOUNTER — Encounter (HOSPITAL_COMMUNITY): Payer: Self-pay

## 2021-01-22 ENCOUNTER — Other Ambulatory Visit: Payer: Self-pay

## 2021-01-22 DIAGNOSIS — M62838 Other muscle spasm: Secondary | ICD-10-CM | POA: Diagnosis present

## 2021-01-22 DIAGNOSIS — Z79899 Other long term (current) drug therapy: Secondary | ICD-10-CM

## 2021-01-22 DIAGNOSIS — K859 Acute pancreatitis without necrosis or infection, unspecified: Secondary | ICD-10-CM

## 2021-01-22 DIAGNOSIS — Z823 Family history of stroke: Secondary | ICD-10-CM

## 2021-01-22 DIAGNOSIS — R9431 Abnormal electrocardiogram [ECG] [EKG]: Secondary | ICD-10-CM | POA: Diagnosis present

## 2021-01-22 DIAGNOSIS — K852 Alcohol induced acute pancreatitis without necrosis or infection: Secondary | ICD-10-CM | POA: Diagnosis not present

## 2021-01-22 DIAGNOSIS — K861 Other chronic pancreatitis: Secondary | ICD-10-CM | POA: Diagnosis present

## 2021-01-22 DIAGNOSIS — E781 Pure hyperglyceridemia: Secondary | ICD-10-CM | POA: Diagnosis present

## 2021-01-22 DIAGNOSIS — Z880 Allergy status to penicillin: Secondary | ICD-10-CM

## 2021-01-22 DIAGNOSIS — E785 Hyperlipidemia, unspecified: Secondary | ICD-10-CM | POA: Diagnosis present

## 2021-01-22 DIAGNOSIS — I10 Essential (primary) hypertension: Secondary | ICD-10-CM | POA: Diagnosis present

## 2021-01-22 DIAGNOSIS — S2239XA Fracture of one rib, unspecified side, initial encounter for closed fracture: Secondary | ICD-10-CM | POA: Diagnosis present

## 2021-01-22 DIAGNOSIS — Z7984 Long term (current) use of oral hypoglycemic drugs: Secondary | ICD-10-CM

## 2021-01-22 DIAGNOSIS — X58XXXA Exposure to other specified factors, initial encounter: Secondary | ICD-10-CM | POA: Diagnosis present

## 2021-01-22 DIAGNOSIS — R001 Bradycardia, unspecified: Secondary | ICD-10-CM | POA: Diagnosis present

## 2021-01-22 DIAGNOSIS — F1020 Alcohol dependence, uncomplicated: Secondary | ICD-10-CM | POA: Diagnosis present

## 2021-01-22 DIAGNOSIS — F419 Anxiety disorder, unspecified: Secondary | ICD-10-CM | POA: Diagnosis present

## 2021-01-22 DIAGNOSIS — R29810 Facial weakness: Secondary | ICD-10-CM | POA: Diagnosis present

## 2021-01-22 DIAGNOSIS — Z20822 Contact with and (suspected) exposure to covid-19: Secondary | ICD-10-CM | POA: Diagnosis present

## 2021-01-22 DIAGNOSIS — E440 Moderate protein-calorie malnutrition: Secondary | ICD-10-CM | POA: Insufficient documentation

## 2021-01-22 DIAGNOSIS — E876 Hypokalemia: Secondary | ICD-10-CM | POA: Diagnosis present

## 2021-01-22 DIAGNOSIS — F17210 Nicotine dependence, cigarettes, uncomplicated: Secondary | ICD-10-CM | POA: Diagnosis present

## 2021-01-22 HISTORY — DX: Essential (primary) hypertension: I10

## 2021-01-22 LAB — COMPREHENSIVE METABOLIC PANEL
ALT: 13 U/L (ref 0–44)
AST: 19 U/L (ref 15–41)
Albumin: 4 g/dL (ref 3.5–5.0)
Alkaline Phosphatase: 59 U/L (ref 38–126)
Anion gap: 14 (ref 5–15)
BUN: 14 mg/dL (ref 6–20)
CO2: 20 mmol/L — ABNORMAL LOW (ref 22–32)
Calcium: 9.6 mg/dL (ref 8.9–10.3)
Chloride: 103 mmol/L (ref 98–111)
Creatinine, Ser: 0.92 mg/dL (ref 0.61–1.24)
GFR, Estimated: >60 mL/min (ref >60)
Glucose, Bld: 195 mg/dL — ABNORMAL HIGH (ref 70–99)
Potassium: 4.2 mmol/L (ref 3.5–5.1)
Sodium: 137 mmol/L (ref 135–145)
Total Bilirubin: 0.9 mg/dL (ref 0.3–1.2)
Total Protein: 6.7 g/dL (ref 6.5–8.1)

## 2021-01-22 LAB — LIPID PANEL
Cholesterol: 178 mg/dL (ref 0–200)
HDL: 24 mg/dL — ABNORMAL LOW (ref >40)
LDL Cholesterol: 120 mg/dL — ABNORMAL HIGH (ref 0–99)
Total CHOL/HDL Ratio: 7.4 RATIO
Triglycerides: 169 mg/dL — ABNORMAL HIGH (ref <150)
VLDL: 34 mg/dL (ref 0–40)

## 2021-01-22 LAB — TROPONIN I (HIGH SENSITIVITY)
Troponin I (High Sensitivity): 4 ng/L (ref <18)
Troponin I (High Sensitivity): 3 ng/L (ref <18)

## 2021-01-22 LAB — CBC
HCT: 42 % (ref 39.0–52.0)
Hemoglobin: 14.2 g/dL (ref 13.0–17.0)
MCH: 30.5 pg (ref 26.0–34.0)
MCHC: 33.8 g/dL (ref 30.0–36.0)
MCV: 90.3 fL (ref 80.0–100.0)
Platelets: 256 10*3/uL (ref 150–400)
RBC: 4.65 MIL/uL (ref 4.22–5.81)
RDW: 12.4 % (ref 11.5–15.5)
WBC: 11.2 10*3/uL — ABNORMAL HIGH (ref 4.0–10.5)
nRBC: 0 % (ref 0.0–0.2)

## 2021-01-22 LAB — LIPASE, BLOOD: Lipase: 522 U/L — ABNORMAL HIGH (ref 11–51)

## 2021-01-22 IMAGING — CT CT ANGIO CHEST-ABD-PELV FOR DISSECTION W/ AND WO/W CM
2 of 7 series · 11 of 46 positions shown, 12 images · IV contrast (OMNI 350)
Comparison: [DATE]
COMPARISON: [DATE]

Addendum:
CLINICAL DATA: Abdominal pain with nausea and vomiting at work

EXAM:
CT ANGIOGRAPHY CHEST, ABDOMEN AND PELVIS
TECHNIQUE: Non-contrast CT of the chest was initially obtained.

[Series 7: dissection 2mm · axial · 0.71mm/px · z∈[+208,+748]mm · 8 of 348 slices shown, 9 images]
[im 39/348  soft-tissue]
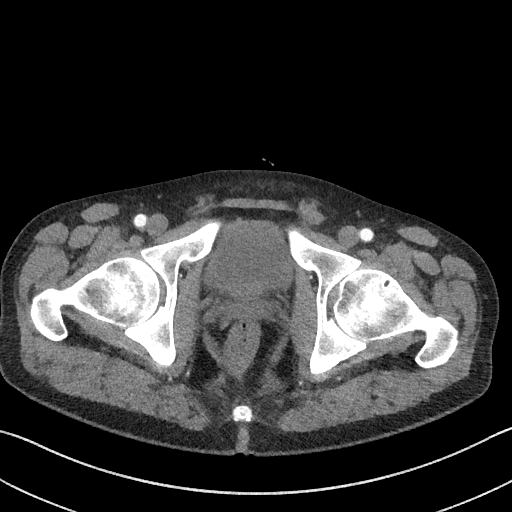
[im 39/348  bone]
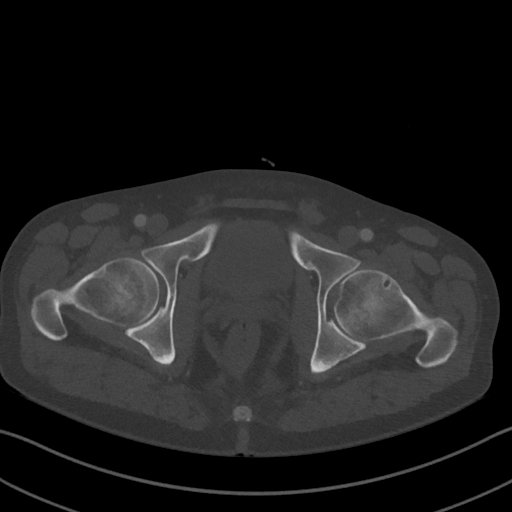
[im 78/348  soft-tissue]
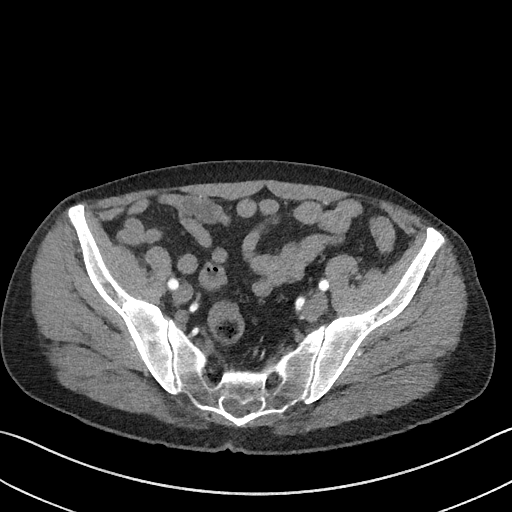
[im 116/348  soft-tissue]
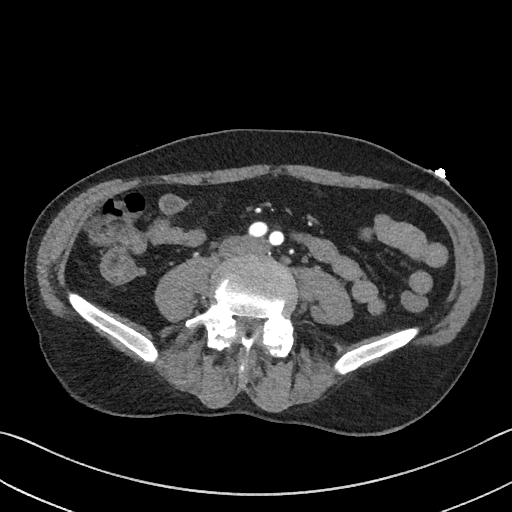
[im 155/348  soft-tissue]
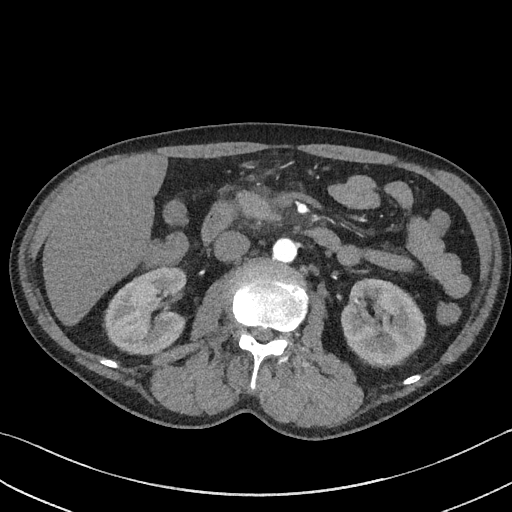
[im 193/348  soft-tissue]
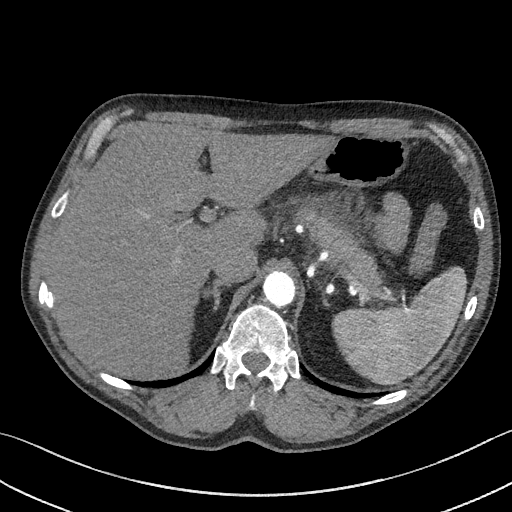
[im 232/348  soft-tissue]
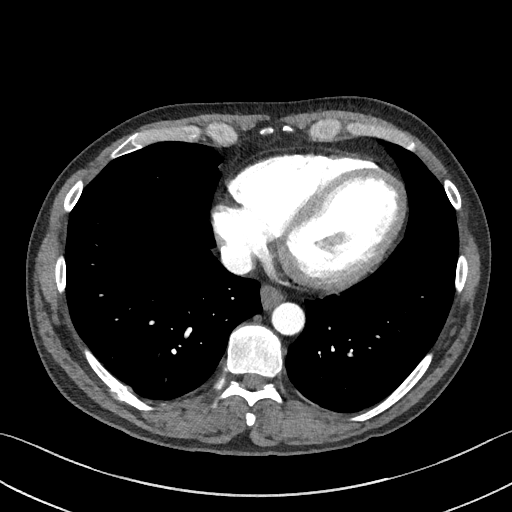
[im 270/348  soft-tissue]
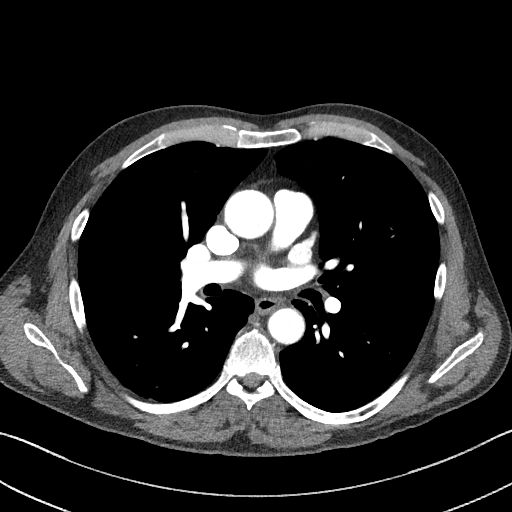
[im 309/348  soft-tissue]
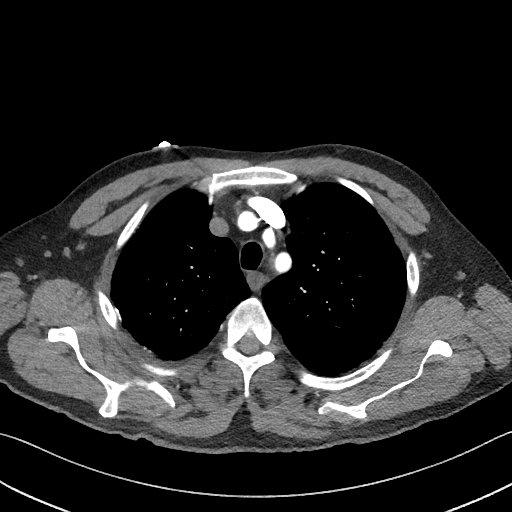

[Series 10: dissection 2mm cor · coronal · 0.77mm/px · 3 of 135 slices shown]
[im 34/135  soft-tissue]
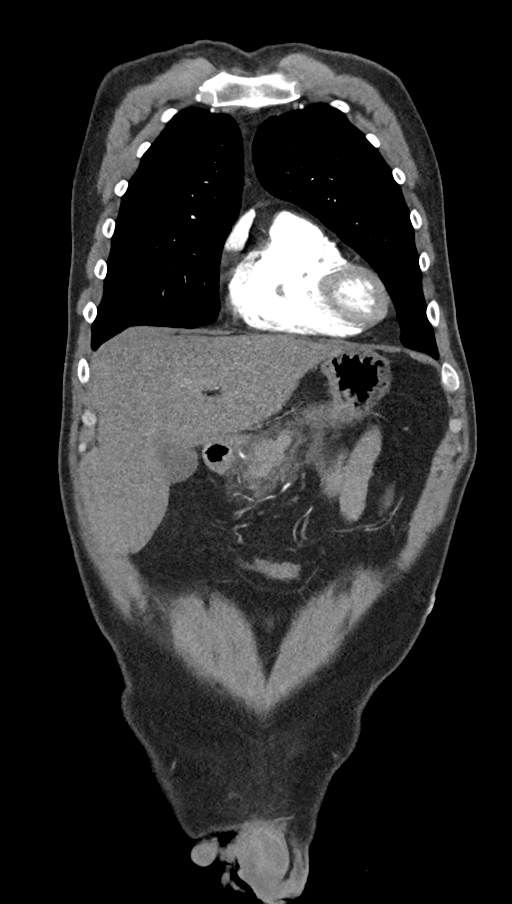
[im 68/135  soft-tissue]
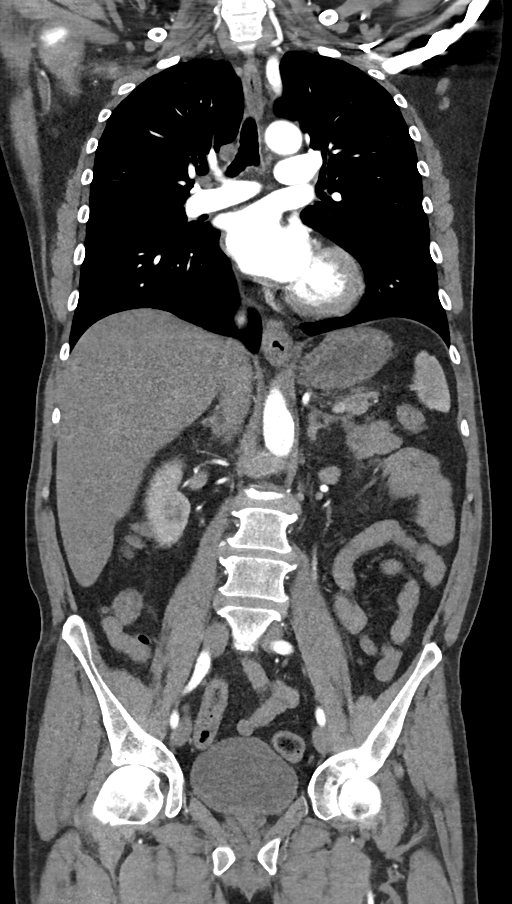
[im 101/135  soft-tissue]
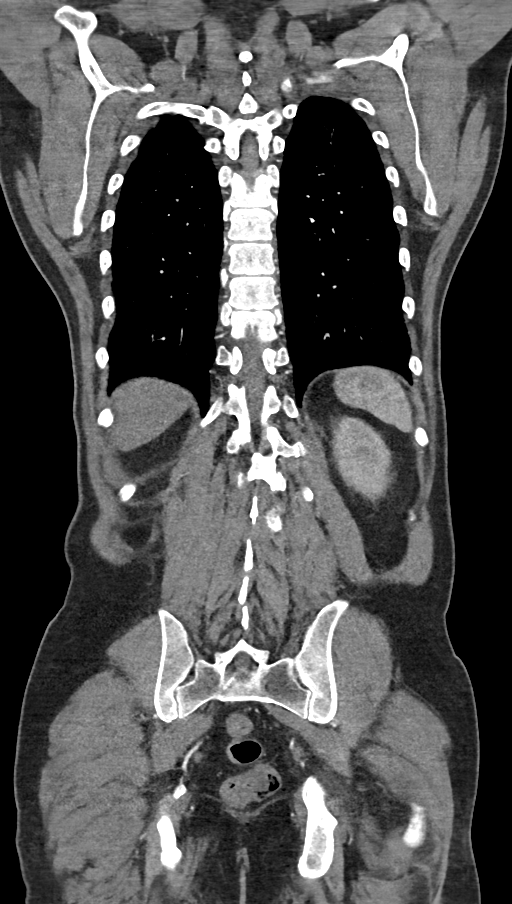

[11 of 46 positions shown; findings below may reference images not displayed]

Multidetector CT imaging through the chest, abdomen and pelvis was
performed using the standard protocol during bolus administration of
intravenous contrast. Multiplanar reconstructed images and MIPs were
obtained and reviewed to evaluate the vascular anatomy.

CONTRAST:  100mL OMNIPAQUE IOHEXOL 350 MG/ML SOLN
FINDINGS: CTA CHEST FINDINGS

Cardiovascular: Satisfactory opacification of the pulmonary arteries
to the segmental level. No evidence of pulmonary embolism. Normal
heart size. No pericardial effusion.

Mediastinum/Nodes: No enlarged mediastinal, hilar, or axillary lymph
nodes. Thyroid gland, trachea, and esophagus demonstrate no
significant findings.

Lungs/Pleura: Lungs are clear. No pleural effusion or pneumothorax.
Mild right apical scarring.

Musculoskeletal: No chest wall abnormality. No acute or significant
osseous findings.

Review of the MIP images confirms the above findings.

CTA ABDOMEN AND PELVIS FINDINGS

VASCULAR

Aorta: Normal caliber aorta without aneurysm, dissection, vasculitis
or significant stenosis.

Celiac: Patent without evidence of aneurysm, dissection, vasculitis
or significant stenosis.

SMA: Patent without evidence of aneurysm, dissection, vasculitis or
significant stenosis.

Renals: Both renal arteries are patent without evidence of aneurysm,
dissection, vasculitis, fibromuscular dysplasia or significant
stenosis.

IMA: Patent without evidence of aneurysm, dissection, vasculitis or
significant stenosis.

Inflow: Patent without evidence of aneurysm, dissection, vasculitis
or significant stenosis.

Veins: No obvious venous abnormality within the limitations of this
arterial phase study.

Review of the MIP images confirms the above findings.

NON-VASCULAR

Hepatobiliary: No focal liver abnormality is seen. No gallstones,
gallbladder wall thickening, or biliary dilatation.

Pancreas: Severe peripancreatic inflammatory changes primarily
involving the pancreatic head and body most consistent with acute
pancreatitis. No focal fluid collection. Pancreas enhances normally
without areas of necrosis. Small punctate calcification in the
pancreatic head.

Spleen: Normal in size without focal abnormality.

Adrenals/Urinary Tract: Adrenal glands are unremarkable. Kidneys are
normal, without renal calculi, focal lesion, or hydronephrosis.
Bladder is unremarkable.

Stomach/Bowel: Stomach is within normal limits. Appendix appears
normal. No evidence of bowel wall thickening, distention, or
inflammatory changes.

Lymphatic: No lymphadenopathy.

Reproductive: Prostate is unremarkable.

Other: No abdominal wall hernia or abnormality. No abdominopelvic
ascites.

Musculoskeletal: No acute osseous abnormality. No aggressive osseous
lesion. Degenerative disease with disc height loss at L1-2.

Review of the MIP images confirms the above findings.
IMPRESSION: 1. No evidence of an aortic dissection.
2. Acute pancreatitis. No focal fluid collection or pancreatic
necrosis.

ADDENDUM:
Not mentioned above:

Subacute right posterior ninth and eleventh rib fractures.

*** End of Addendum ***
Multidetector CT imaging through the chest, abdomen and pelvis was
performed using the standard protocol during bolus administration of
intravenous contrast. Multiplanar reconstructed images and MIPs were
obtained and reviewed to evaluate the vascular anatomy.

CONTRAST:  100mL OMNIPAQUE IOHEXOL 350 MG/ML SOLN
FINDINGS: CTA CHEST FINDINGS

Cardiovascular: Satisfactory opacification of the pulmonary arteries
to the segmental level. No evidence of pulmonary embolism. Normal
heart size. No pericardial effusion.

Mediastinum/Nodes: No enlarged mediastinal, hilar, or axillary lymph
nodes. Thyroid gland, trachea, and esophagus demonstrate no
significant findings.

Lungs/Pleura: Lungs are clear. No pleural effusion or pneumothorax.
Mild right apical scarring.

Musculoskeletal: No chest wall abnormality. No acute or significant
osseous findings.

Review of the MIP images confirms the above findings.

CTA ABDOMEN AND PELVIS FINDINGS

VASCULAR

Aorta: Normal caliber aorta without aneurysm, dissection, vasculitis
or significant stenosis.

Celiac: Patent without evidence of aneurysm, dissection, vasculitis
or significant stenosis.

SMA: Patent without evidence of aneurysm, dissection, vasculitis or
significant stenosis.

Renals: Both renal arteries are patent without evidence of aneurysm,
dissection, vasculitis, fibromuscular dysplasia or significant
stenosis.

IMA: Patent without evidence of aneurysm, dissection, vasculitis or
significant stenosis.

Inflow: Patent without evidence of aneurysm, dissection, vasculitis
or significant stenosis.

Veins: No obvious venous abnormality within the limitations of this
arterial phase study.

Review of the MIP images confirms the above findings.

NON-VASCULAR

Hepatobiliary: No focal liver abnormality is seen. No gallstones,
gallbladder wall thickening, or biliary dilatation.

Pancreas: Severe peripancreatic inflammatory changes primarily
involving the pancreatic head and body most consistent with acute
pancreatitis. No focal fluid collection. Pancreas enhances normally
without areas of necrosis. Small punctate calcification in the
pancreatic head.

Spleen: Normal in size without focal abnormality.

Adrenals/Urinary Tract: Adrenal glands are unremarkable. Kidneys are
normal, without renal calculi, focal lesion, or hydronephrosis.
Bladder is unremarkable.

Stomach/Bowel: Stomach is within normal limits. Appendix appears
normal. No evidence of bowel wall thickening, distention, or
inflammatory changes.

Lymphatic: No lymphadenopathy.

Reproductive: Prostate is unremarkable.

Other: No abdominal wall hernia or abnormality. No abdominopelvic
ascites.

Musculoskeletal: No acute osseous abnormality. No aggressive osseous
lesion. Degenerative disease with disc height loss at L1-2.

Review of the MIP images confirms the above findings.
IMPRESSION: 1. No evidence of an aortic dissection.
2. Acute pancreatitis. No focal fluid collection or pancreatic
necrosis.

## 2021-01-22 MED ORDER — IOHEXOL 350 MG/ML SOLN
100.0000 mL | Freq: Once | INTRAVENOUS | Status: AC | PRN
  Administered 2021-01-22: 100 mL via INTRAVENOUS

## 2021-01-22 MED ORDER — ONDANSETRON HCL 4 MG/2ML IJ SOLN
4.0000 mg | Freq: Once | INTRAMUSCULAR | Status: AC | PRN
  Administered 2021-01-22: 4 mg via INTRAVENOUS
  Filled 2021-01-22: qty 2

## 2021-01-22 MED ORDER — SODIUM CHLORIDE 0.9 % IV SOLN: INTRAVENOUS | Status: DC

## 2021-01-22 MED ORDER — NICOTINE 21 MG/24HR TD PT24
21.0000 mg | MEDICATED_PATCH | Freq: Every day | TRANSDERMAL | Status: DC
  Administered 2021-01-22 – 2021-01-24 (×3): 21 mg via TRANSDERMAL
  Filled 2021-01-22 (×3): qty 1

## 2021-01-22 MED ORDER — PROCHLORPERAZINE EDISYLATE 10 MG/2ML IJ SOLN
10.0000 mg | Freq: Once | INTRAMUSCULAR | Status: AC
  Administered 2021-01-22: 10 mg via INTRAVENOUS
  Filled 2021-01-22: qty 2

## 2021-01-22 MED ORDER — ENOXAPARIN SODIUM 40 MG/0.4ML IJ SOSY
40.0000 mg | PREFILLED_SYRINGE | INTRAMUSCULAR | Status: DC
  Administered 2021-01-23: 40 mg via SUBCUTANEOUS
  Filled 2021-01-22 (×2): qty 0.4

## 2021-01-22 MED ORDER — PANTOPRAZOLE SODIUM 40 MG IV SOLR
40.0000 mg | Freq: Once | INTRAVENOUS | Status: AC
  Administered 2021-01-22: 40 mg via INTRAVENOUS
  Filled 2021-01-22: qty 40

## 2021-01-22 MED ORDER — MORPHINE SULFATE (PF) 4 MG/ML IV SOLN
6.0000 mg | Freq: Once | INTRAVENOUS | Status: AC
  Administered 2021-01-22: 6 mg via INTRAVENOUS
  Filled 2021-01-22: qty 2

## 2021-01-22 MED ORDER — MORPHINE SULFATE (PF) 2 MG/ML IV SOLN
2.0000 mg | INTRAVENOUS | Status: DC | PRN
  Filled 2021-01-22: qty 1

## 2021-01-22 MED ORDER — SODIUM CHLORIDE 0.9 % IV BOLUS
1000.0000 mL | Freq: Once | INTRAVENOUS | Status: AC
  Administered 2021-01-22: 1000 mL via INTRAVENOUS

## 2021-01-22 MED ORDER — SODIUM CHLORIDE 0.9 % IV SOLN: Freq: Once | INTRAVENOUS | Status: AC

## 2021-01-22 NOTE — Progress Notes (Signed)
FPTS Brief Progress Note  S:Received a page from nurse that patient was having nausea/vomiting. Patient given dose of compazine. At time of visit to bedside, patient reports that his nausea is improved and he is looking forward to getting some rest. He reports some mild abdominal pain that is much improved from the time of admission.     O: BP (!) 158/124 (BP Location: Right Arm)   Pulse 80   Temp 98.2 F (36.8 C) (Oral)   Resp 16   Ht 6' (1.829 m)   Wt 78 kg   SpO2 98%   BMI 23.32 kg/m   Physical Exam  General: male appearing stated age in no acute distress sitting upright in hospital bed  Cardio: Normal S1 and S2, no S3 or S4. Rhythm is regular. No murmurs or rubs.  Pulm: Clear to auscultation bilaterally, no crackles, wheezing, or diminished breath sounds. Normal respiratory effort, stable on RA Abdomen: bowel sounds present Extremities: No peripheral edema.    A/P: Samuel Abbott is a 60 year old male with a past medical history of pancreatitis, hypertriglyceridemia, alcohol use disorder, hypertension, tobacco use who presented with nausea/vomiting and abdominal pain, currently being treated for acute pancreatitis, improved since admission.  Acute Pacreatitis  alcohol use disorder -Continue fluids and bowel rest -Given one-time dose of IV prochlorperazine for nausea -Continue to monitor for signs of alcohol withdrawals - Orders reviewed. Labs for AM ordered, which was adjusted as needed.  - If condition changes, plan includes adjusting pain medication as appropriate and monitoring emesis/nausea.   Samuel Ramp, MD 01/22/2021, 10:37 PM PGY-3, [facility] Night Resident  Please page [phone] with questions.

## 2021-01-22 NOTE — ED Notes (Signed)
ED Provider at bedside. 

## 2021-01-22 NOTE — ED Notes (Signed)
No longer vomiting, nausea better

## 2021-01-22 NOTE — ED Provider Notes (Signed)
[facility] Provider Note   CSN: [id number] Arrival date & time: 01/22/21  0932     History Chief Complaint  Patient presents with   Abdominal Pain    Samuel Abbott is a 60 y.o. male.  The history is provided by the patient.  Abdominal Pain Pain location:  Generalized Pain quality: cramping, pressure and sharp   Pain radiates to:  Chest Pain severity:  Severe Onset quality:  Sudden Duration:  2 hours Timing:  Constant Progression:  Unchanged Chronicity:  New Context: not previous surgeries   Relieved by:  Nothing Worsened by:  Nothing Associated symptoms: nausea and vomiting   Associated symptoms: no anorexia, no belching, no chest pain, no chills, no constipation, no cough, no diarrhea, no dysuria, no fatigue, no fever, no hematochezia, no hematuria, no shortness of breath and no sore throat   Risk factors: alcohol abuse   Risk factors: has not had multiple surgeries       Past Medical History:  Diagnosis Date   Acute pancreatitis    Alcohol abuse    Eczema    Tobacco abuse     Patient Active Problem List   Diagnosis Date Noted   Hypertriglyceridemia 09/10/2018   Acute alcoholic pancreatitis 09/09/2018   Nausea & vomiting 09/09/2018   Tobacco abuse 09/09/2018   Chronic alcohol abuse 09/09/2018   Acute pancreatitis 09/08/2018    History reviewed. No pertinent surgical history.     Family History  Problem Relation Age of Onset   CVA Father     Social History   Tobacco Use   Smoking status: Every Day    Packs/day: 1.50    Years: 40.00    Pack years: 60.00    Types: Cigarettes   Smokeless tobacco: Current  Substance Use Topics   Alcohol use: Yes    Alcohol/week: 56.0 standard drinks    Types: 56 Shots of liquor per week    Comment: 8 shots of whiskey per day, plus 2 beers    Home Medications Prior to Admission medications   Medication Sig Start Date End Date Taking? Authorizing Provider   DULoxetine (CYMBALTA) 20 MG capsule Take 20 mg by mouth 2 (two) times daily. 01/13/21   [provider]  fenofibrate (TRICOR) 145 MG tablet Take 145 mg by mouth daily. 11/29/20   [provider]  gemfibrozil (LOPID) 600 MG tablet Take 1 tablet (600 mg total) by mouth 2 (two) times daily for 30 days. 09/10/18 10/10/18  [place], Alvira PhilipsEric J, DO  testosterone cypionate (DEPOTESTOSTERONE CYPIONATE) 200 MG/ML injection Inject 200 mg into the muscle every 28 (twenty-eight) days.  08/08/18   [provider]    Allergies    Penicillins  Review of Systems   Review of Systems  Constitutional:  Negative for chills, fatigue and fever.  HENT:  Negative for ear pain and sore throat.   Eyes:  Negative for pain and visual disturbance.  Respiratory:  Negative for cough and shortness of breath.   Cardiovascular:  Negative for chest pain and palpitations.  Gastrointestinal:  Positive for abdominal pain, nausea and vomiting. Negative for anorexia, constipation, diarrhea and hematochezia.  Genitourinary:  Negative for dysuria and hematuria.  Musculoskeletal:  Negative for arthralgias and back pain.  Skin:  Negative for color change and rash.  Neurological:  Negative for seizures and syncope.  All other systems reviewed and are negative.  Physical Exam Updated Vital Signs BP (!) 182/90 (BP Location: Left Arm)   Pulse Marland Kitchen(!)  50   Temp 98.6 F (37 C)   Resp (!) 22   SpO2 100%   Physical Exam Vitals and nursing note reviewed.  Constitutional:      General: He is in acute distress.     Appearance: He is well-developed. He is ill-appearing.  HENT:     Head: Normocephalic and atraumatic.  Eyes:     Conjunctiva/sclera: Conjunctivae normal.  Cardiovascular:     Rate and Rhythm: Normal rate and regular rhythm.     Heart sounds: No murmur heard. Pulmonary:     Effort: Pulmonary effort is normal. No respiratory distress.     Breath sounds: Normal breath sounds.  Abdominal:      Palpations: Abdomen is soft.     Tenderness: There is generalized abdominal tenderness. There is guarding.  Musculoskeletal:     Cervical back: Neck supple.  Skin:    General: Skin is warm and dry.     Capillary Refill: Capillary refill takes less than 2 seconds.  Neurological:     General: No focal deficit present.     Mental Status: He is alert and oriented to person, place, and time.     Cranial Nerves: No cranial nerve deficit.     Motor: No weakness.     Comments: 5+ out of 5 strength throughout, normal sensation, no drift, normal finger-nose-finger, normal speech    ED Results / Procedures / Treatments   Labs (all labs ordered are listed, but only abnormal results are displayed) Labs Reviewed  LIPASE, BLOOD - Abnormal; Notable for the following components:      Result Value   Lipase 522 (*)    All other components within normal limits  COMPREHENSIVE METABOLIC PANEL - Abnormal; Notable for the following components:   CO2 20 (*)    Glucose, Bld 195 (*)    All other components within normal limits  CBC - Abnormal; Notable for the following components:   WBC 11.2 (*)    All other components within normal limits  URINALYSIS, ROUTINE W REFLEX MICROSCOPIC  TROPONIN I (HIGH SENSITIVITY)  TROPONIN I (HIGH SENSITIVITY)    EKG EKG Interpretation  Date/Time:  Wednesday January 22 2021 10:47:16 EDT Ventricular Rate:  53 PR Interval:  176 QRS Duration: 136 QT Interval:  546 QTC Calculation: 512 R Axis:   66 Text Interpretation: Sinus bradycardia Confirmed by Virgina Norfolk ([id number]) on 01/22/2021 10:49:31 AM  Radiology CT Angio Chest/Abd/Pel for Dissection W and/or Wo Contrast  Result Date: 01/22/2021 CLINICAL DATA:  Abdominal pain with nausea and vomiting at work EXAM: CT ANGIOGRAPHY CHEST, ABDOMEN AND PELVIS TECHNIQUE: Non-contrast CT of the chest was initially obtained. Multidetector CT imaging through the chest, abdomen and pelvis was performed using the standard protocol during  bolus administration of intravenous contrast. Multiplanar reconstructed images and MIPs were obtained and reviewed to evaluate the vascular anatomy. CONTRAST:  OMNIPAQUE IOHEXOL 350 MG/ML SOLN COMPARISON:  09/08/2018 FINDINGS: CTA CHEST FINDINGS Cardiovascular: Satisfactory opacification of the pulmonary arteries to the segmental level. No evidence of pulmonary embolism. Normal heart size. No pericardial effusion. Mediastinum/Nodes: No enlarged mediastinal, hilar, or axillary lymph nodes. Thyroid gland, trachea, and esophagus demonstrate no significant findings. Lungs/Pleura: Lungs are clear. No pleural effusion or pneumothorax. Mild right apical scarring. Musculoskeletal: No chest wall abnormality. No acute or significant osseous findings. Review of the MIP images confirms the above findings. CTA ABDOMEN AND PELVIS FINDINGS VASCULAR Aorta: Normal caliber aorta without aneurysm, dissection, vasculitis or significant stenosis. Celiac: Patent without evidence  of aneurysm, dissection, vasculitis or significant stenosis. SMA: Patent without evidence of aneurysm, dissection, vasculitis or significant stenosis. Renals: Both renal arteries are patent without evidence of aneurysm, dissection, vasculitis, fibromuscular dysplasia or significant stenosis. IMA: Patent without evidence of aneurysm, dissection, vasculitis or significant stenosis. Inflow: Patent without evidence of aneurysm, dissection, vasculitis or significant stenosis. Veins: No obvious venous abnormality within the limitations of this arterial phase study. Review of the MIP images confirms the above findings. NON-VASCULAR Hepatobiliary: No focal liver abnormality is seen. No gallstones, gallbladder wall thickening, or biliary dilatation. Pancreas: Severe peripancreatic inflammatory changes primarily involving the pancreatic head and body most consistent with acute pancreatitis. No focal fluid collection. Pancreas enhances normally without areas of  necrosis. Small punctate calcification in the pancreatic head. Spleen: Normal in size without focal abnormality. Adrenals/Urinary Tract: Adrenal glands are unremarkable. Kidneys are normal, without renal calculi, focal lesion, or hydronephrosis. Bladder is unremarkable. Stomach/Bowel: Stomach is within normal limits. Appendix appears normal. No evidence of bowel wall thickening, distention, or inflammatory changes. Lymphatic: No lymphadenopathy. Reproductive: Prostate is unremarkable. Other: No abdominal wall hernia or abnormality. No abdominopelvic ascites. Musculoskeletal: No acute osseous abnormality. No aggressive osseous lesion. Degenerative disease with disc height loss at L1-2. Review of the MIP images confirms the above findings. IMPRESSION: 1. No evidence of an aortic dissection. 2. Acute pancreatitis. No focal fluid collection or pancreatic necrosis. Electronically Signed   By: Elige Ko   On: 01/22/2021 12:39    Procedures Procedures   Medications Ordered in ED Medications  0.9 %  sodium chloride infusion (has no administration in time range)  ondansetron (ZOFRAN) injection 4 mg (4 mg Intravenous Given 01/22/21 0955)  morphine 4 MG/ML injection 6 mg (6 mg Intravenous Given 01/22/21 1023)  sodium chloride 0.9 % bolus 1,000 mL (0 mLs Intravenous Stopped 01/22/21 1130)  pantoprazole (PROTONIX) injection 40 mg (40 mg Intravenous Given 01/22/21 1024)  iohexol (OMNIPAQUE) 350 MG/ML injection 100 mL (100 mLs Intravenous Contrast Given 01/22/21 1211)    ED Course  I have reviewed the triage vital signs and the nursing notes.  Pertinent labs & imaging results that were available during my care of the patient were reviewed by me and considered in my medical decision making (see chart for details).    MDM Rules/Calculators/A&P                          Samuel Latus is here with abdominal pain, nausea, vomiting, chest pain.  Normal vitals.  No fever.  States that he woke up feeling well this  morning and then he had suddenly severe abdominal pain, nausea vomiting, chest pain.  Has a history of alcohol abuse and pancreatitis.  Denies any suspicious food intake or recent heavy alcohol use.  He is very uncomfortable on exam and overall difficulty getting history and physical.  Neurologically he appears intact.  He states he is feeling numbness in his lower legs but he appears to have normal neurological exam.  Strong pulses in his legs.  Overall my suspicion is that this is likely a colitis or pancreatitis but given how uncomfortable he is will get a CT dissection study.  Will give Zofran, morphine, fluid bolus, Protonix.  Possible that he could have a perforated ulcer.  Denies any melena or hematochezia.  Lipase elevated to 522.  CT scan consistent with acute pancreatitis.  No dissection.  Feeling better after pain medication, IV fluids, IV Protonix, IV Zofran.  Will  admit to medicine for further care.  This chart was dictated using voice recognition software.  Despite best efforts to proofread,  errors can occur which can change the documentation meaning.   Final Clinical Impression(s) / ED Diagnoses Final diagnoses:  Acute pancreatitis, unspecified complication status, unspecified pancreatitis type    Rx / DC Orders ED Discharge Orders     None        Virgina Norfolk, DO 01/22/21 1315

## 2021-01-22 NOTE — ED Notes (Signed)
Provider at bedside
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
## 2021-01-22 NOTE — ED Notes (Signed)
Gerre Pebbles (work contact) [phone]

## 2021-01-22 NOTE — ED Notes (Signed)
Patient transported to CT 

## 2021-01-22 NOTE — ED Notes (Signed)
Pharmacy at bedside

## 2021-01-22 NOTE — ED Triage Notes (Signed)
Sudden onset abdominal pain with N/V while at work, with tenderness on palpation x 4 quadrants. MAEE, bradycardic with RBBB noted on EKG,

## 2021-01-23 ENCOUNTER — Inpatient Hospital Stay (HOSPITAL_COMMUNITY): Payer: No Typology Code available for payment source

## 2021-01-23 ENCOUNTER — Encounter (HOSPITAL_COMMUNITY): Payer: Self-pay | Admitting: Family Medicine

## 2021-01-23 ENCOUNTER — Other Ambulatory Visit (HOSPITAL_COMMUNITY): Payer: No Typology Code available for payment source

## 2021-01-23 DIAGNOSIS — E785 Hyperlipidemia, unspecified: Secondary | ICD-10-CM | POA: Diagnosis present

## 2021-01-23 DIAGNOSIS — I10 Essential (primary) hypertension: Secondary | ICD-10-CM | POA: Diagnosis present

## 2021-01-23 DIAGNOSIS — Z823 Family history of stroke: Secondary | ICD-10-CM | POA: Diagnosis not present

## 2021-01-23 DIAGNOSIS — Z7984 Long term (current) use of oral hypoglycemic drugs: Secondary | ICD-10-CM | POA: Diagnosis not present

## 2021-01-23 DIAGNOSIS — X58XXXA Exposure to other specified factors, initial encounter: Secondary | ICD-10-CM | POA: Diagnosis present

## 2021-01-23 DIAGNOSIS — F419 Anxiety disorder, unspecified: Secondary | ICD-10-CM | POA: Diagnosis present

## 2021-01-23 DIAGNOSIS — R001 Bradycardia, unspecified: Secondary | ICD-10-CM | POA: Diagnosis present

## 2021-01-23 DIAGNOSIS — E440 Moderate protein-calorie malnutrition: Secondary | ICD-10-CM | POA: Insufficient documentation

## 2021-01-23 DIAGNOSIS — G459 Transient cerebral ischemic attack, unspecified: Secondary | ICD-10-CM | POA: Diagnosis not present

## 2021-01-23 DIAGNOSIS — F1020 Alcohol dependence, uncomplicated: Secondary | ICD-10-CM | POA: Diagnosis present

## 2021-01-23 DIAGNOSIS — K852 Alcohol induced acute pancreatitis without necrosis or infection: Secondary | ICD-10-CM | POA: Diagnosis present

## 2021-01-23 DIAGNOSIS — Z79899 Other long term (current) drug therapy: Secondary | ICD-10-CM | POA: Diagnosis not present

## 2021-01-23 DIAGNOSIS — F17210 Nicotine dependence, cigarettes, uncomplicated: Secondary | ICD-10-CM | POA: Diagnosis present

## 2021-01-23 DIAGNOSIS — R9431 Abnormal electrocardiogram [ECG] [EKG]: Secondary | ICD-10-CM | POA: Diagnosis present

## 2021-01-23 DIAGNOSIS — Z20822 Contact with and (suspected) exposure to covid-19: Secondary | ICD-10-CM | POA: Diagnosis present

## 2021-01-23 DIAGNOSIS — K859 Acute pancreatitis without necrosis or infection, unspecified: Secondary | ICD-10-CM | POA: Diagnosis not present

## 2021-01-23 DIAGNOSIS — M62838 Other muscle spasm: Secondary | ICD-10-CM | POA: Diagnosis present

## 2021-01-23 DIAGNOSIS — Z880 Allergy status to penicillin: Secondary | ICD-10-CM | POA: Diagnosis not present

## 2021-01-23 DIAGNOSIS — S2239XA Fracture of one rib, unspecified side, initial encounter for closed fracture: Secondary | ICD-10-CM | POA: Diagnosis present

## 2021-01-23 DIAGNOSIS — E876 Hypokalemia: Secondary | ICD-10-CM | POA: Diagnosis present

## 2021-01-23 DIAGNOSIS — E781 Pure hyperglyceridemia: Secondary | ICD-10-CM | POA: Diagnosis present

## 2021-01-23 DIAGNOSIS — R29810 Facial weakness: Secondary | ICD-10-CM | POA: Diagnosis present

## 2021-01-23 LAB — COMPREHENSIVE METABOLIC PANEL
ALT: 12 U/L (ref 0–44)
AST: 15 U/L (ref 15–41)
Albumin: 3.5 g/dL (ref 3.5–5.0)
Alkaline Phosphatase: 57 U/L (ref 38–126)
Anion gap: 6 (ref 5–15)
BUN: 12 mg/dL (ref 6–20)
CO2: 22 mmol/L (ref 22–32)
Calcium: 8.8 mg/dL — ABNORMAL LOW (ref 8.9–10.3)
Chloride: 108 mmol/L (ref 98–111)
Creatinine, Ser: 0.74 mg/dL (ref 0.61–1.24)
GFR, Estimated: >60 mL/min (ref >60)
Glucose, Bld: 140 mg/dL — ABNORMAL HIGH (ref 70–99)
Potassium: 3.4 mmol/L — ABNORMAL LOW (ref 3.5–5.1)
Sodium: 136 mmol/L (ref 135–145)
Total Bilirubin: 0.4 mg/dL (ref 0.3–1.2)
Total Protein: 6.1 g/dL — ABNORMAL LOW (ref 6.5–8.1)

## 2021-01-23 LAB — CBC
HCT: 37.2 % — ABNORMAL LOW (ref 39.0–52.0)
Hemoglobin: 12.8 g/dL — ABNORMAL LOW (ref 13.0–17.0)
MCH: 30.6 pg (ref 26.0–34.0)
MCHC: 34.4 g/dL (ref 30.0–36.0)
MCV: 89 fL (ref 80.0–100.0)
Platelets: 221 10*3/uL (ref 150–400)
RBC: 4.18 MIL/uL — ABNORMAL LOW (ref 4.22–5.81)
RDW: 12.4 % (ref 11.5–15.5)
WBC: 11.3 10*3/uL — ABNORMAL HIGH (ref 4.0–10.5)
nRBC: 0 % (ref 0.0–0.2)

## 2021-01-23 LAB — SARS CORONAVIRUS 2 (TAT 6-24 HRS): SARS Coronavirus 2: NEGATIVE

## 2021-01-23 LAB — HEMOGLOBIN A1C
Hgb A1c MFr Bld: 6.7 % — ABNORMAL HIGH (ref 4.8–5.6)
Mean Plasma Glucose: 145.59 mg/dL

## 2021-01-23 LAB — HIV ANTIBODY (ROUTINE TESTING W REFLEX): HIV Screen 4th Generation wRfx: NONREACTIVE

## 2021-01-23 IMAGING — MR MR MRA NECK WO/W CM
6 of 8 series · 38 of 48 positions shown · IV contrast (Gadavist)
Comparison: None available.

CLINICAL DATA: Initial evaluation for acute TIA.

EXAM:
MRI HEAD WITHOUT CONTRAST
MRA HEAD WITHOUT CONTRAST
MRA NECK WITHOUT AND WITH CONTRAST
TECHNIQUE: Multiplanar, multi-echo pulse sequences of the brain and surrounding
structures were acquired without intravenous contrast. Angiographic
images of the Circle of Willis were acquired using MRA technique
without intravenous contrast. Angiographic images of the neck were
acquired using MRA technique without and with intravenous contrast.
Carotid stenosis measurements (when applicable) are obtained
utilizing NASCET criteria, using the distal internal carotid
diameter as the denominator.
CONTRAST:  7.8mL GADAVIST GADOBUTROL 1 MMOL/ML IV SOLN

[Series 11: tof_fl3d_tra_iso · axial · 0.6mm · 0.52mm/px · z∈[-173,-86]mm · 11 of 146 slices shown]
[im 1/146]
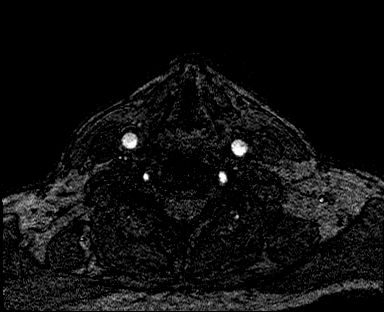
[im 15/146]
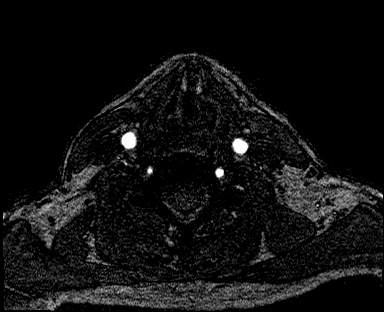
[im 30/146]
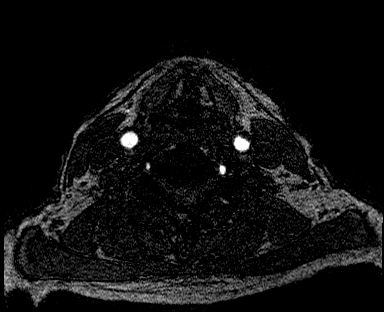
[im 44/146]
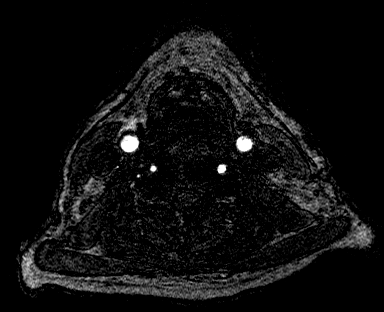
[im 59/146]
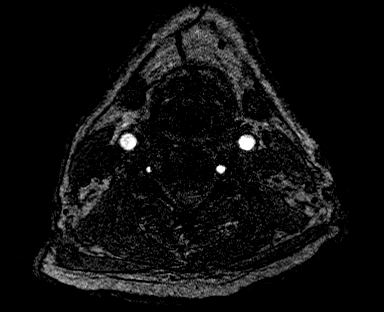
[im 73/146]
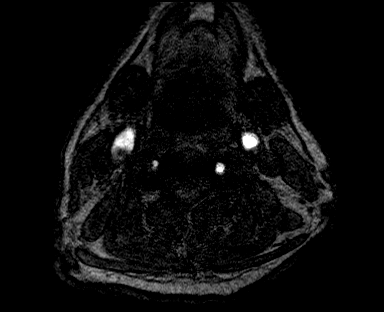
[im 88/146]
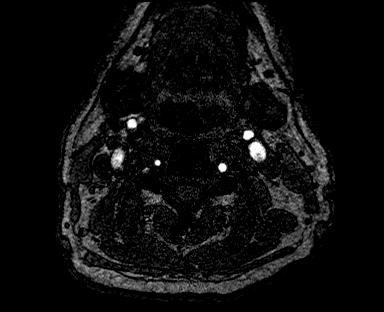
[im 102/146]
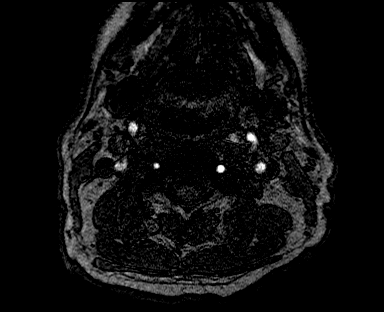
[im 117/146]
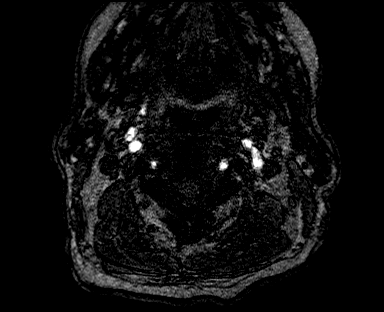
[im 131/146]
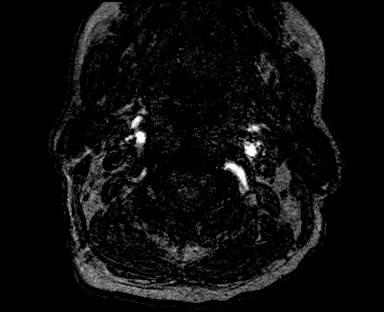
[im 146/146]
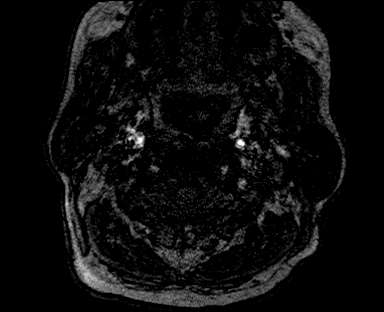

[Series 15: angio_fl3d_cor_pre_ttc=3.0s · coronal · 0.9mm · 0.85mm/px · 6 of 80 slices shown]
[im 1/80]
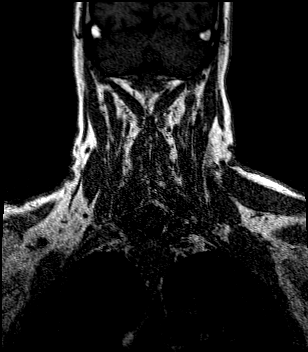
[im 16/80]
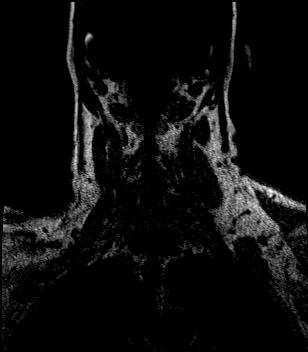
[im 32/80]
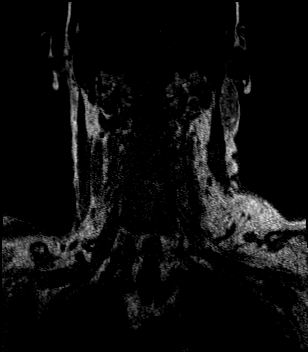
[im 48/80]
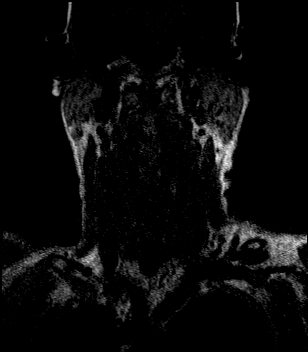
[im 64/80]
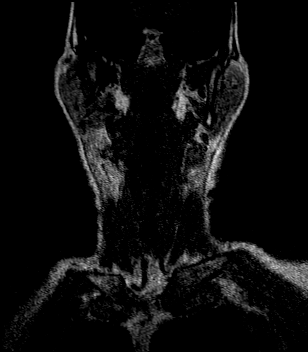
[im 80/80]
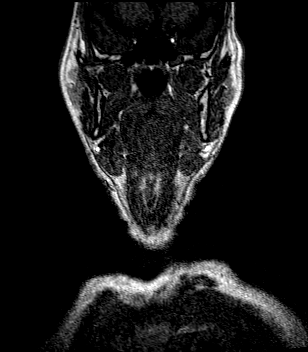

[Series 20: angio_fl3d_cor_post_ttc=3.0s · coronal · 0.9mm · 0.85mm/px · 6 of 80 slices shown (1 of 2)]
[im 1/80]
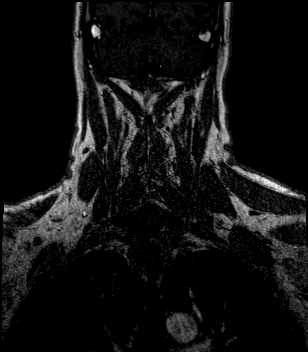
[im 16/80]
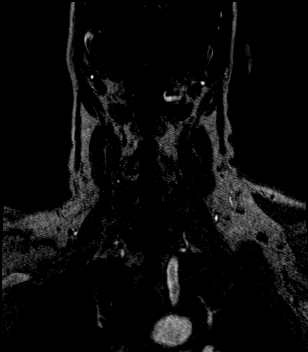
[im 32/80]
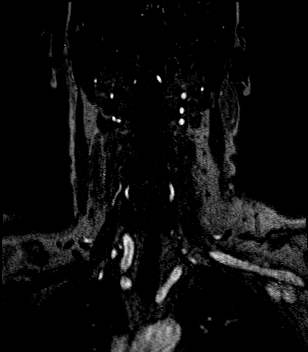
[im 48/80]
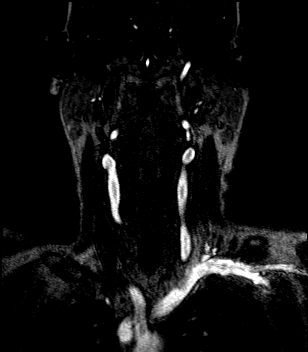
[im 64/80]
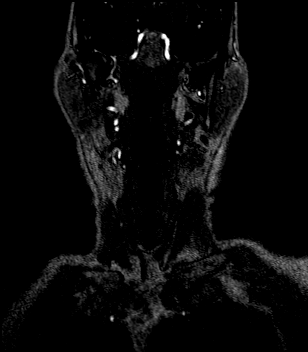
[im 80/80]
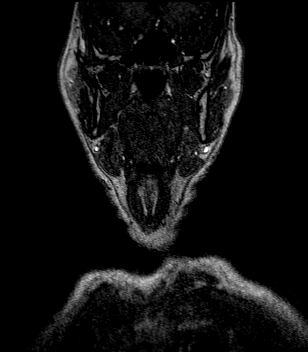

[Series 21: angio_fl3d_cor_post_ttc=3.0s_moco-adv · coronal · 0.9mm · 0.85mm/px · 5 of 80 slices shown]
[im 1/80]
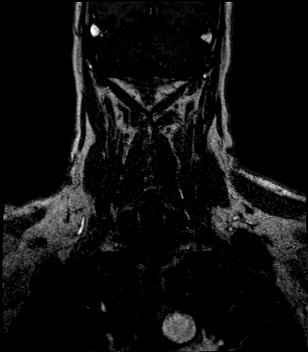
[im 20/80]
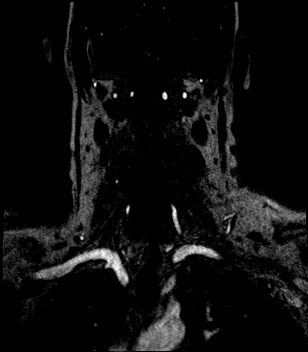
[im 40/80]
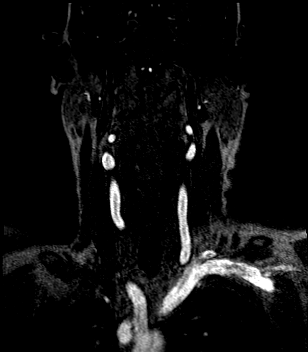
[im 60/80]
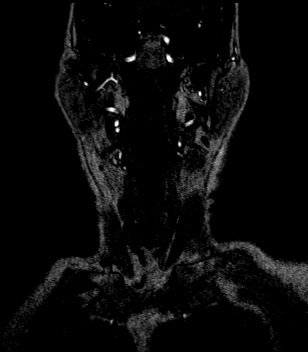
[im 80/80]
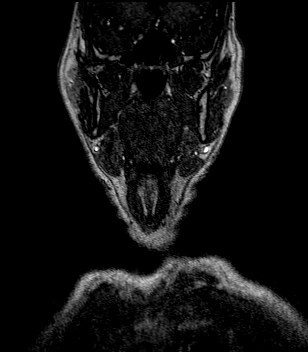

[Series 22: angio_fl3d_cor_post_ttc=3.0s_moco-adv_sub · coronal · 0.9mm · 0.85mm/px · 5 of 80 slices shown]
[im 1/80]
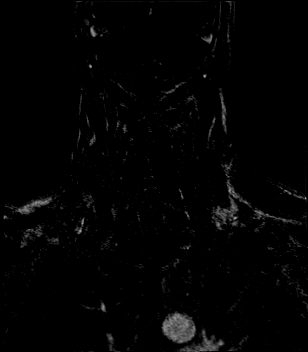
[im 20/80]
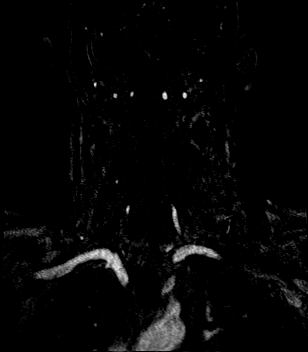
[im 40/80]
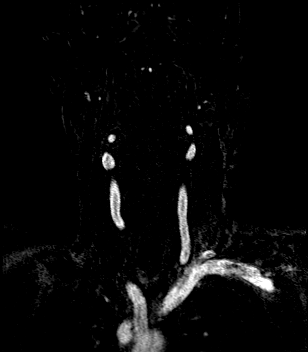
[im 60/80]
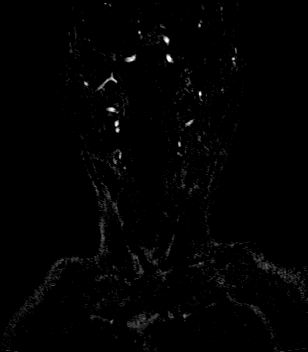
[im 80/80]
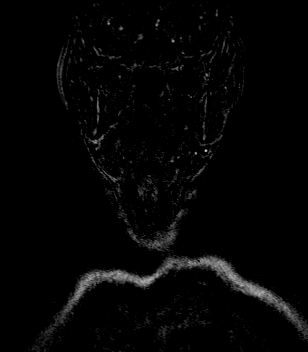

[Series 24: angio_fl3d_cor_post_ttc=3.0s · coronal · 0.9mm · 0.85mm/px · 5 of 80 slices shown (2 of 2)]
[im 1/80]
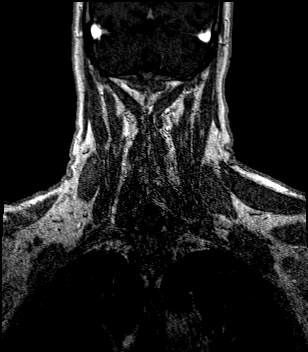
[im 20/80]
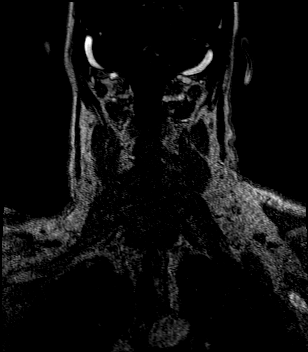
[im 40/80]
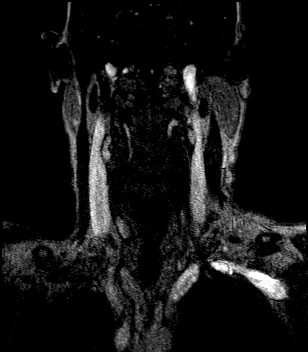
[im 60/80]
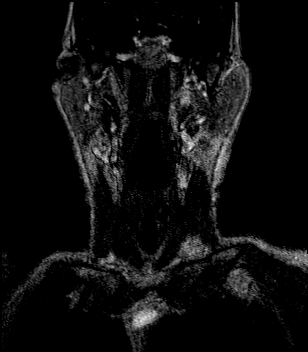
[im 80/80]
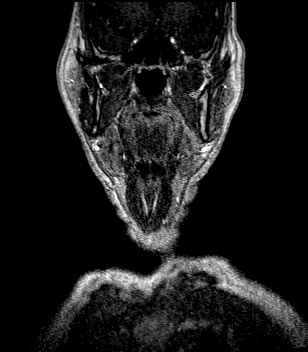

[38 of 48 positions shown; findings below may reference images not displayed]

FINDINGS: MRI HEAD FINDINGS

Brain: Examination degraded by motion artifact.

Cerebral volume within normal limits for age. No significant
cerebral white matter disease or other focal parenchymal signal
abnormality. No abnormal foci of restricted diffusion to suggest
acute or subacute ischemia. Gray-white matter differentiation
maintained. No encephalomalacia to suggest chronic cortical
infarction. No evidence for acute or chronic intracranial
hemorrhage.

No mass lesion, midline shift or mass effect. No hydrocephalus or
extra-axial fluid collection. Pituitary gland suprasellar region
within normal limits. Midline structures intact.

Vascular: Major intracranial vascular flow voids are maintained.

Skull and upper cervical spine: Craniocervical junction within
normal limits. Bone marrow signal intensity normal. No scalp soft
tissue abnormality.

Sinuses/Orbits: Globes and orbital soft tissues demonstrate no acute
finding. Scattered mucosal thickening noted throughout the paranasal
sinuses with superimposed air-fluid levels within the maxillary
sinuses. Trace right mastoid effusion, of doubtful significance.
Inner ear structures grossly normal.

Other: None.

MRA HEAD FINDINGS

Anterior circulation: Examination degraded by motion.

Visualized distal cervical segments of the internal carotid arteries
are patent with antegrade flow. Petrous, cavernous, and supraclinoid
segments patent without stenosis or other abnormality. A1 segments
widely patent. Normal anterior communicating artery complex.
Anterior cerebral arteries patent to their distal aspects without
stenosis. No M1 stenosis or occlusion. Normal MCA bifurcations.
Distal MCA branches well perfused and symmetric.

Posterior circulation: Both vertebral arteries patent to the
vertebrobasilar junction without stenosis. Left vertebral artery
dominant. Neither PICA origin well visualized. Basilar patent to its
distal aspect without stenosis. Superior cerebellar arteries patent
bilaterally. Left PCA supplied via the basilar. Fetal type origin of
the right PCA. Both PCAs perfused to distal aspects without
stenosis.

Anatomic variants: Fetal type origin of the right PCA.  No aneurysm.

MRA NECK FINDINGS

Aortic arch: Visualized aortic arch normal in caliber with normal 3
vessel morphology. No hemodynamically significant stenosis or other
abnormality seen about the origin of the great vessels.

Right carotid system: Right common and internal carotid arteries
patent without stenosis, evidence for dissection or occlusion.

Left carotid system: Left common and internal carotid arteries
patent without stenosis, evidence for dissection or occlusion.

Vertebral arteries: Both vertebral arteries arise from the
subclavian arteries. No proximal subclavian artery stenosis. Left
vertebral artery dominant. Possible short-segment moderate stenosis
noted at the proximal right vertebral artery just distal to the
origin (series [8S], image 4). Vertebral arteries otherwise patent
within the neck without stenosis, evidence for dissection or
occlusion.

Other: None
IMPRESSION: MRI HEAD IMPRESSION:

1. Normal brain MRI for age.  No acute intracranial abnormality.
2. Acute bilateral maxillary sinusitis.

MRA HEAD IMPRESSION:

Normal intracranial MRA. No large vessel occlusion, hemodynamically
significant stenosis, or other acute vascular abnormality. No
aneurysm.

MRA NECK IMPRESSION:

1. Wide patency of both carotid artery systems within the neck.
2. Possible short-segment moderate stenosis involving the proximal
right V1 segment. Vertebral arteries otherwise widely patent within
the neck. Left vertebral artery dominant.
3. No evidence for dissection or other acute vascular abnormality.

## 2021-01-23 IMAGING — MR MR MRA HEAD W/O CM
1 series · 21 of 48 positions shown · IV contrast (gadavist)
Comparison: None available.

CLINICAL DATA: Initial evaluation for acute TIA.

EXAM:
MRI HEAD WITHOUT CONTRAST
MRA HEAD WITHOUT CONTRAST
MRA NECK WITHOUT AND WITH CONTRAST
TECHNIQUE: Multiplanar, multi-echo pulse sequences of the brain and surrounding
structures were acquired without intravenous contrast. Angiographic
images of the Circle of Willis were acquired using MRA technique
without intravenous contrast. Angiographic images of the neck were
acquired using MRA technique without and with intravenous contrast.
Carotid stenosis measurements (when applicable) are obtained
utilizing NASCET criteria, using the distal internal carotid
diameter as the denominator.
CONTRAST:  7.8mL GADAVIST GADOBUTROL 1 MMOL/ML IV SOLN

[Series 5: 3d cow · axial · 0.5mm · 0.41mm/px · z∈[-69,+30]mm · 21 of 208 slices shown]
[im 1/208]
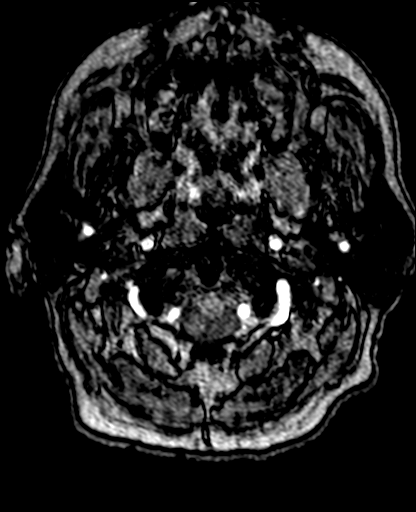
[im 5/208]
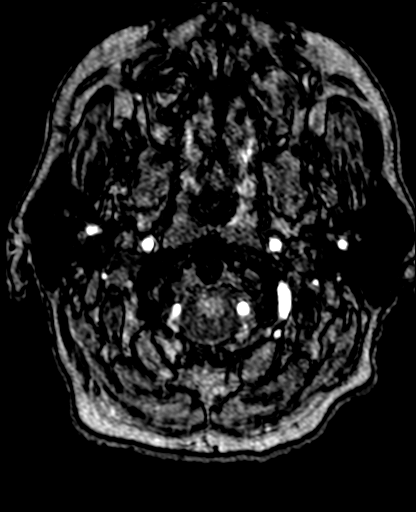
[im 9/208]
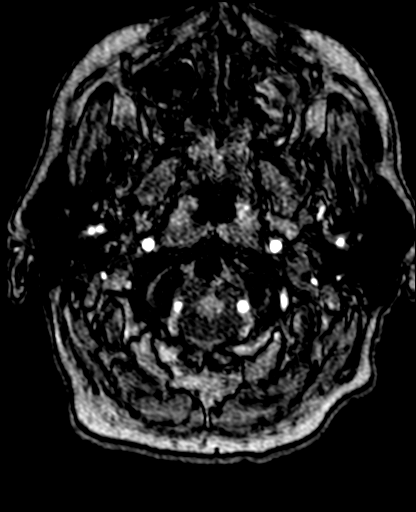
[im 14/208]
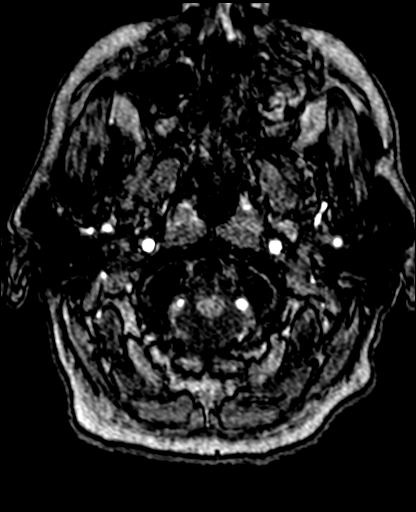
[im 18/208]
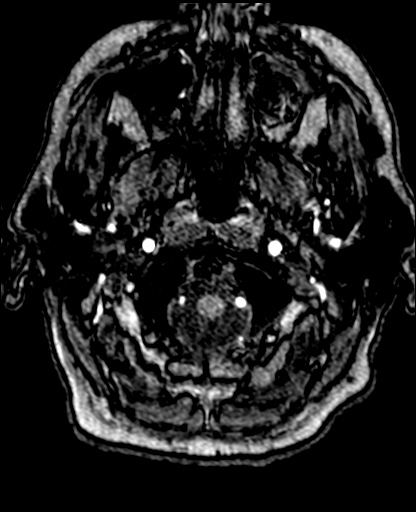
[im 23/208]
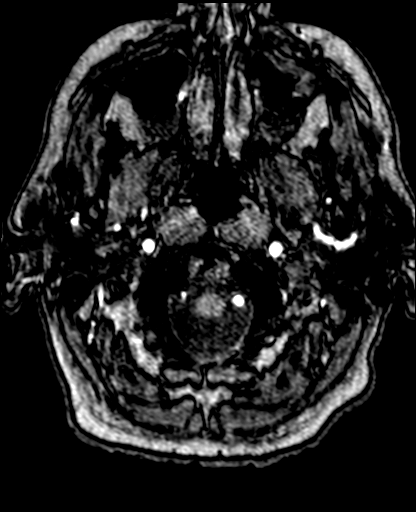
[im 27/208]
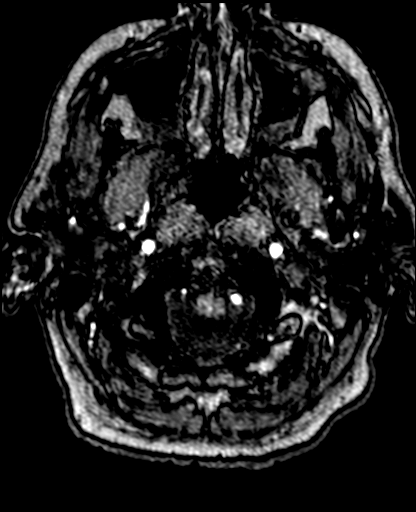
[im 31/208]
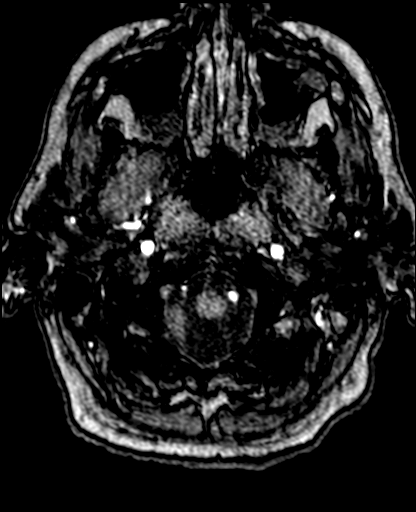
[im 36/208]
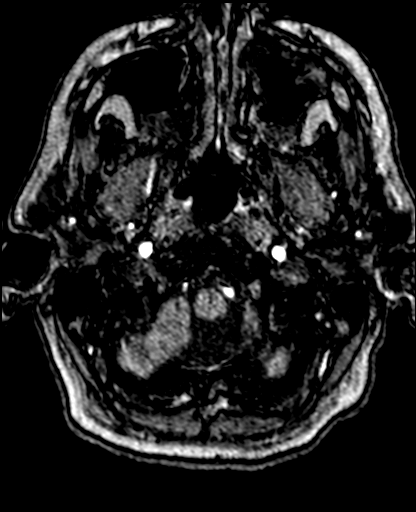
[im 40/208]
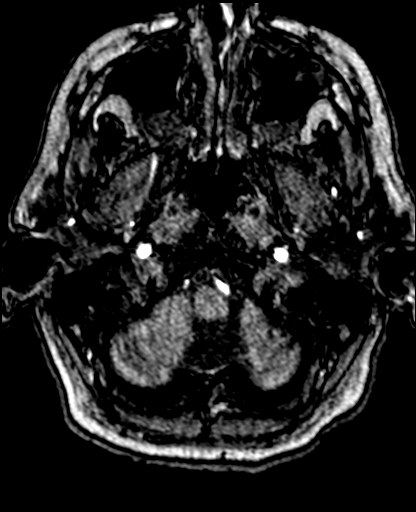
[im 45/208]
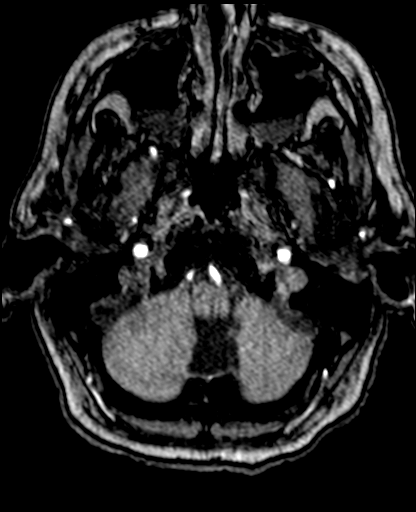
[im 49/208]
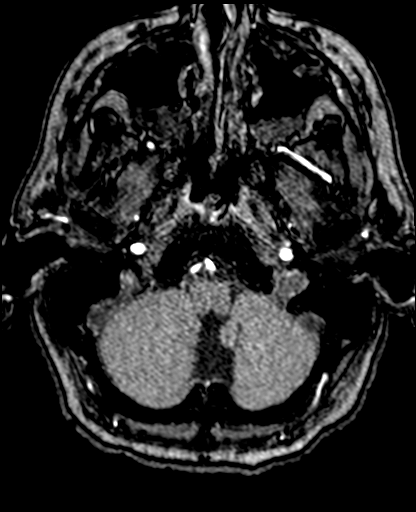
[im 53/208]
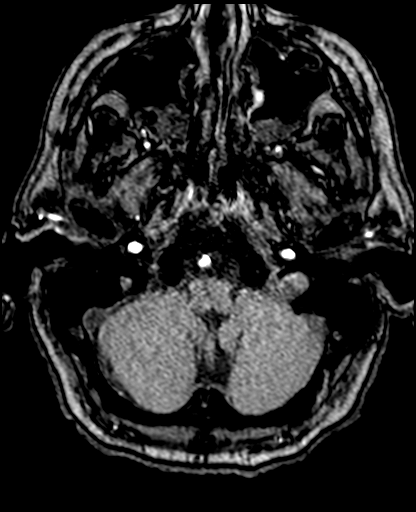
[im 67/208]
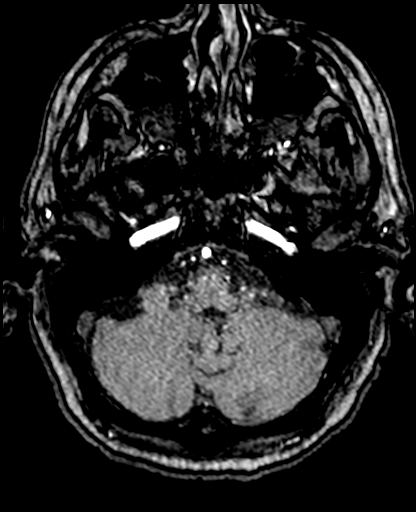
[im 93/208]
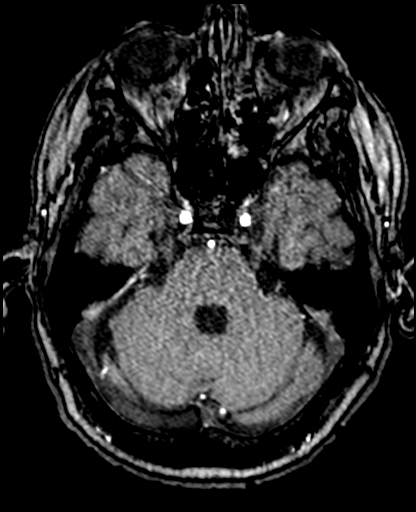
[im 106/208]
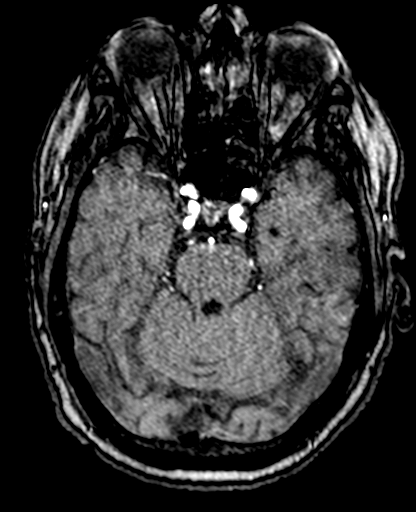
[im 119/208]
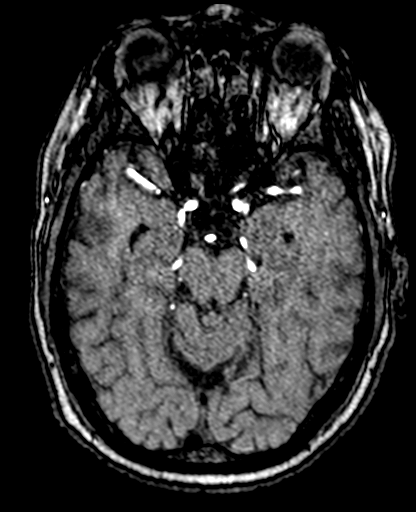
[im 146/208]
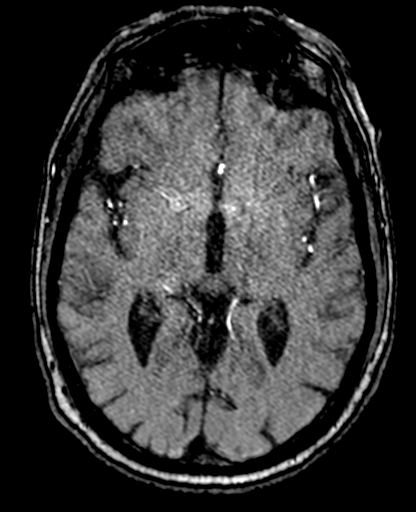
[im 172/208]
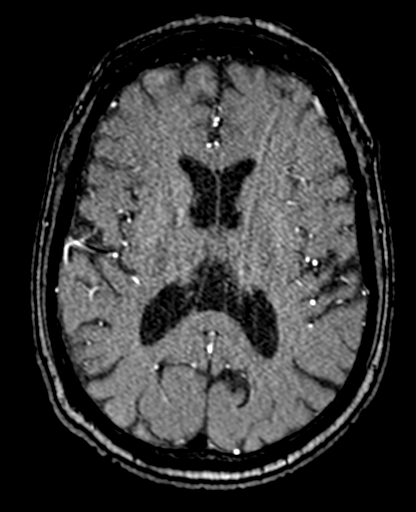
[im 177/208]
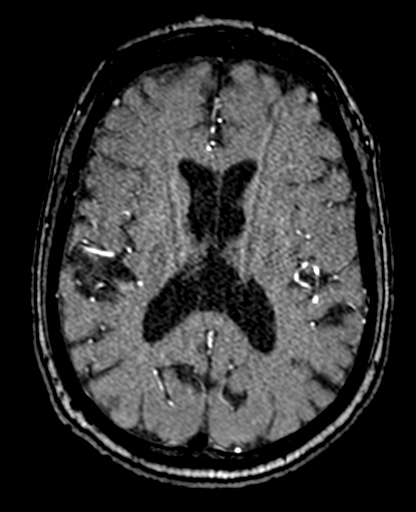
[im 199/208]
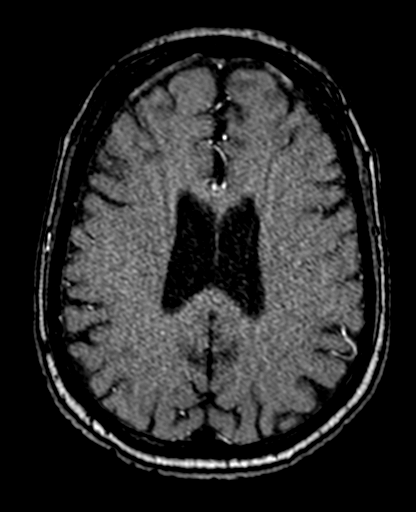

[21 of 48 positions shown; findings below may reference images not displayed]

FINDINGS: MRI HEAD FINDINGS

Brain: Examination degraded by motion artifact.

Cerebral volume within normal limits for age. No significant
cerebral white matter disease or other focal parenchymal signal
abnormality. No abnormal foci of restricted diffusion to suggest
acute or subacute ischemia. Gray-white matter differentiation
maintained. No encephalomalacia to suggest chronic cortical
infarction. No evidence for acute or chronic intracranial
hemorrhage.

No mass lesion, midline shift or mass effect. No hydrocephalus or
extra-axial fluid collection. Pituitary gland suprasellar region
within normal limits. Midline structures intact.

Vascular: Major intracranial vascular flow voids are maintained.

Skull and upper cervical spine: Craniocervical junction within
normal limits. Bone marrow signal intensity normal. No scalp soft
tissue abnormality.

Sinuses/Orbits: Globes and orbital soft tissues demonstrate no acute
finding. Scattered mucosal thickening noted throughout the paranasal
sinuses with superimposed air-fluid levels within the maxillary
sinuses. Trace right mastoid effusion, of doubtful significance.
Inner ear structures grossly normal.

Other: None.

MRA HEAD FINDINGS

Anterior circulation: Examination degraded by motion.

Visualized distal cervical segments of the internal carotid arteries
are patent with antegrade flow. Petrous, cavernous, and supraclinoid
segments patent without stenosis or other abnormality. A1 segments
widely patent. Normal anterior communicating artery complex.
Anterior cerebral arteries patent to their distal aspects without
stenosis. No M1 stenosis or occlusion. Normal MCA bifurcations.
Distal MCA branches well perfused and symmetric.

Posterior circulation: Both vertebral arteries patent to the
vertebrobasilar junction without stenosis. Left vertebral artery
dominant. Neither PICA origin well visualized. Basilar patent to its
distal aspect without stenosis. Superior cerebellar arteries patent
bilaterally. Left PCA supplied via the basilar. Fetal type origin of
the right PCA. Both PCAs perfused to distal aspects without
stenosis.

Anatomic variants: Fetal type origin of the right PCA.  No aneurysm.

MRA NECK FINDINGS

Aortic arch: Visualized aortic arch normal in caliber with normal 3
vessel morphology. No hemodynamically significant stenosis or other
abnormality seen about the origin of the great vessels.

Right carotid system: Right common and internal carotid arteries
patent without stenosis, evidence for dissection or occlusion.

Left carotid system: Left common and internal carotid arteries
patent without stenosis, evidence for dissection or occlusion.

Vertebral arteries: Both vertebral arteries arise from the
subclavian arteries. No proximal subclavian artery stenosis. Left
vertebral artery dominant. Possible short-segment moderate stenosis
noted at the proximal right vertebral artery just distal to the
origin (series [8S], image 4). Vertebral arteries otherwise patent
within the neck without stenosis, evidence for dissection or
occlusion.

Other: None
IMPRESSION: MRI HEAD IMPRESSION:

1. Normal brain MRI for age.  No acute intracranial abnormality.
2. Acute bilateral maxillary sinusitis.

MRA HEAD IMPRESSION:

Normal intracranial MRA. No large vessel occlusion, hemodynamically
significant stenosis, or other acute vascular abnormality. No
aneurysm.

MRA NECK IMPRESSION:

1. Wide patency of both carotid artery systems within the neck.
2. Possible short-segment moderate stenosis involving the proximal
right V1 segment. Vertebral arteries otherwise widely patent within
the neck. Left vertebral artery dominant.
3. No evidence for dissection or other acute vascular abnormality.

## 2021-01-23 IMAGING — MR MR HEAD W/O CM
13 of 14 series · 43 of 48 positions shown · IV contrast (gadavist)
Comparison: None available.

CLINICAL DATA: Initial evaluation for acute TIA.

EXAM:
MRI HEAD WITHOUT CONTRAST
MRA HEAD WITHOUT CONTRAST
MRA NECK WITHOUT AND WITH CONTRAST
TECHNIQUE: Multiplanar, multi-echo pulse sequences of the brain and surrounding
structures were acquired without intravenous contrast. Angiographic
images of the Circle of Willis were acquired using MRA technique
without intravenous contrast. Angiographic images of the neck were
acquired using MRA technique without and with intravenous contrast.
Carotid stenosis measurements (when applicable) are obtained
utilizing NASCET criteria, using the distal internal carotid
diameter as the denominator.
CONTRAST:  7.8mL GADAVIST GADOBUTROL 1 MMOL/ML IV SOLN

[Series 5: DWI · axial · 3.0mm · 0.92mm/px · z∈[-72,+80]mm · 7 of 104 slices shown (1 of 4)]
[im 1/104]
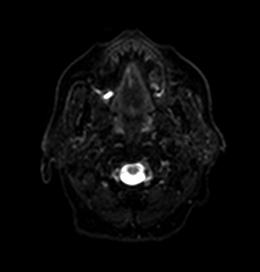
[im 18/104]
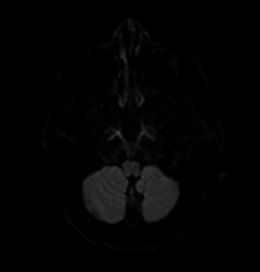
[im 35/104]
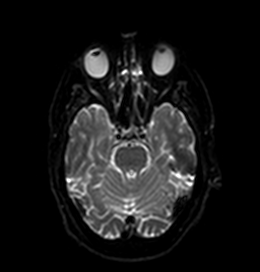
[im 52/104]
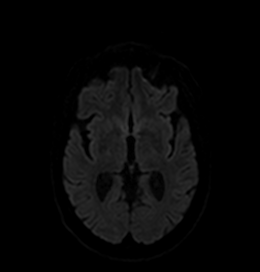
[im 69/104]
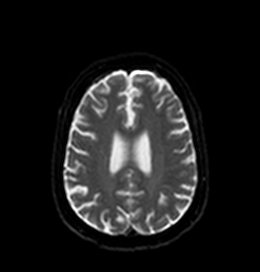
[im 86/104]
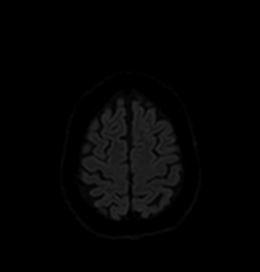
[im 104/104]
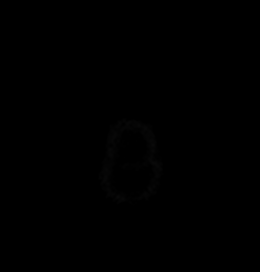

[Series 6: DWI · axial · 3.0mm · 0.92mm/px · z∈[-72,+80]mm · 3 of 52 slices shown (2 of 4)]
[im 1/52]
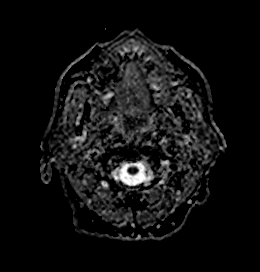
[im 26/52]
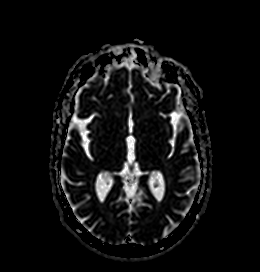
[im 52/52]
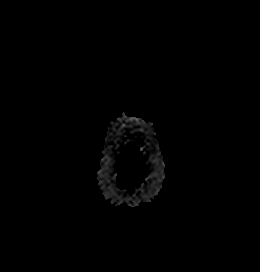

[Series 7: DWI · coronal · 4.0mm · 0.88mm/px · 5 of 72 slices shown (3 of 4)]
[im 1/72]
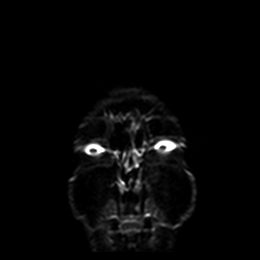
[im 18/72]
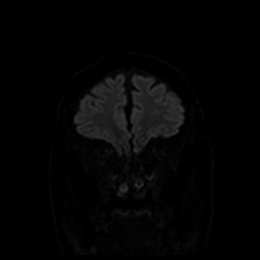
[im 36/72]
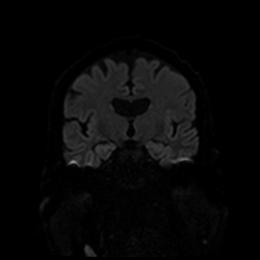
[im 54/72]
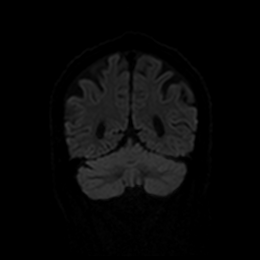
[im 72/72]
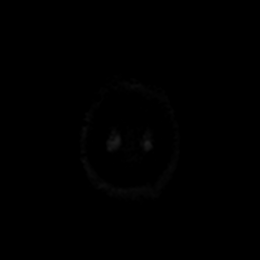

[Series 8: DWI · coronal · 4.0mm · 0.88mm/px · 3 of 36 slices shown (4 of 4)]
[im 1/36]
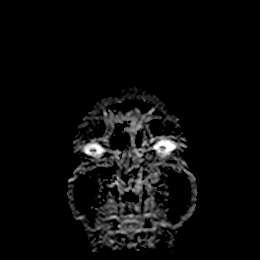
[im 18/36]
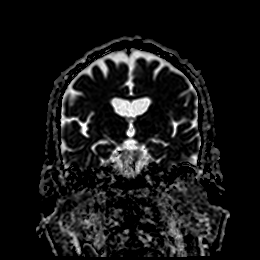
[im 36/36]
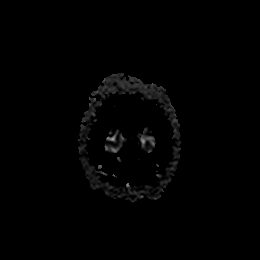

[Series 9: T1 · sagittal · 5.0mm · 0.75mm/px · 2 of 23 slices shown]
[im 1/23]
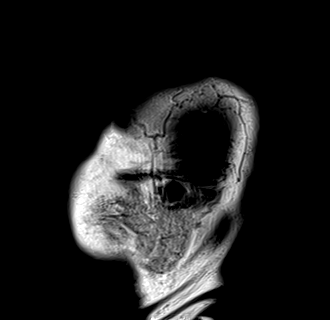
[im 23/23]
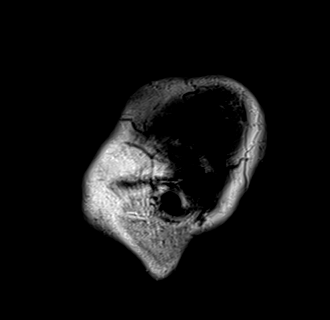

[Series 10: T2 · axial · 5.0mm · 0.72mm/px · z∈[-67,+82]mm · 2 of 26 slices shown (1 of 2)]
[im 1/26]
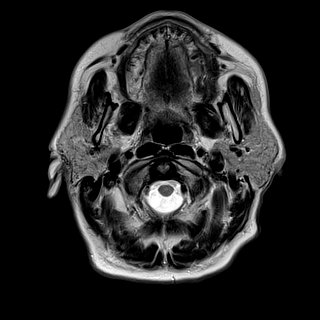
[im 26/26]
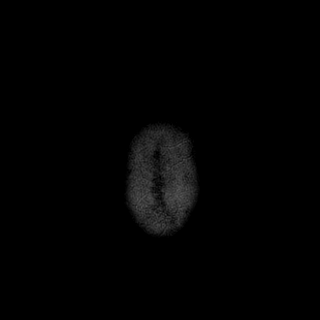

[Series 11: FLAIR · axial · 5.0mm · 0.45mm/px · z∈[-69,+81]mm · 2 of 26 slices shown (1 of 2)]
[im 1/26]
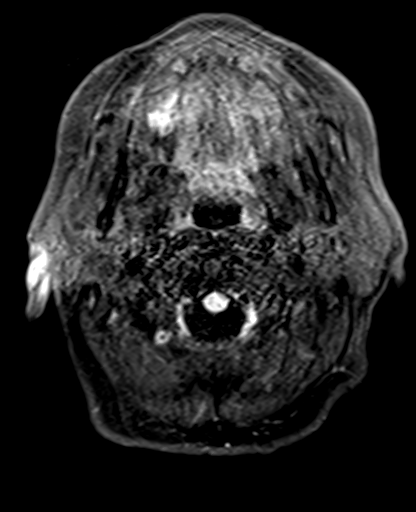
[im 26/26]
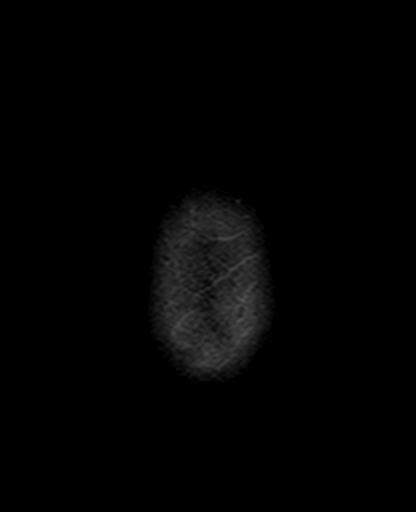

[Series 12: mag_images · axial · 3.0mm · 0.90mm/px · z∈[-73,+91]mm · 4 of 56 slices shown]
[im 1/56]
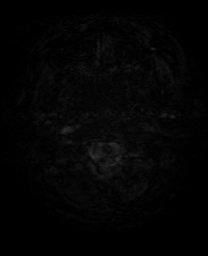
[im 19/56]
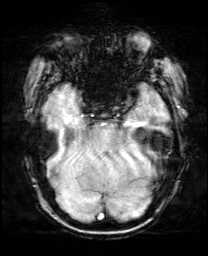
[im 37/56]
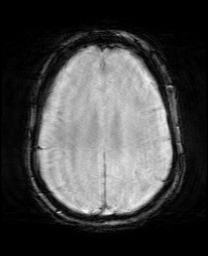
[im 56/56]
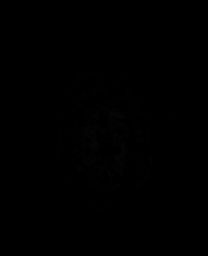

[Series 13: pha_images · axial · 3.0mm · 0.90mm/px · z∈[-73,+85]mm · 4 of 54 slices shown]
[im 1/54]
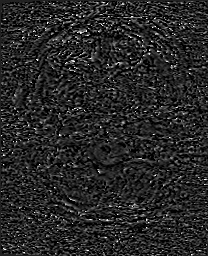
[im 18/54]
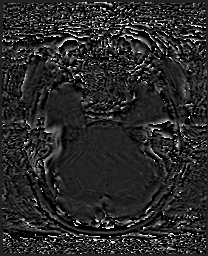
[im 36/54]
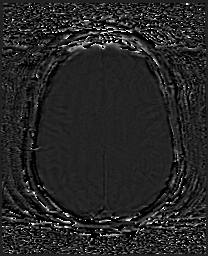
[im 54/54]
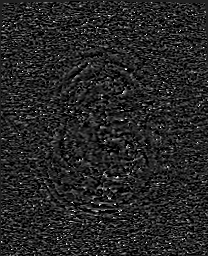

[Series 14: swi_images · axial · 3.0mm · 0.90mm/px · z∈[-73,+91]mm · 4 of 56 slices shown]
[im 1/56]
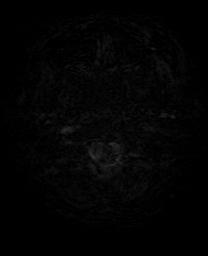
[im 19/56]
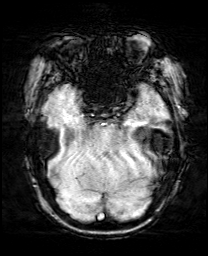
[im 37/56]
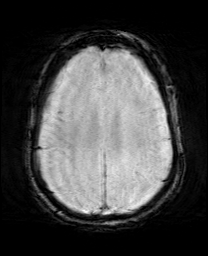
[im 56/56]
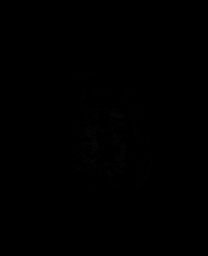

[Series 15: mip_images(sw) · axial · 24.0mm · 0.90mm/px · z∈[-63,+33]mm · 3 of 49 slices shown]
[im 1/49]
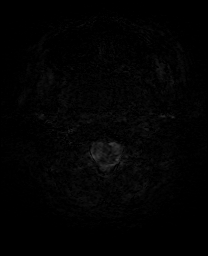
[im 17/49]
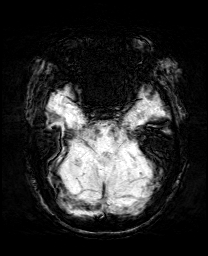
[im 33/49]
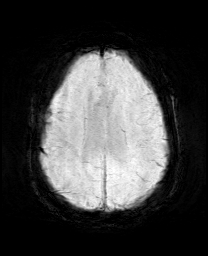

[Series 16: FLAIR · axial · 5.0mm · 0.45mm/px · z∈[-69,+81]mm · 2 of 26 slices shown (2 of 2)]
[im 1/26]
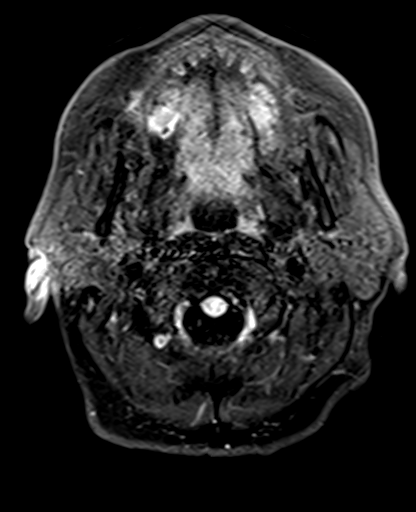
[im 26/26]
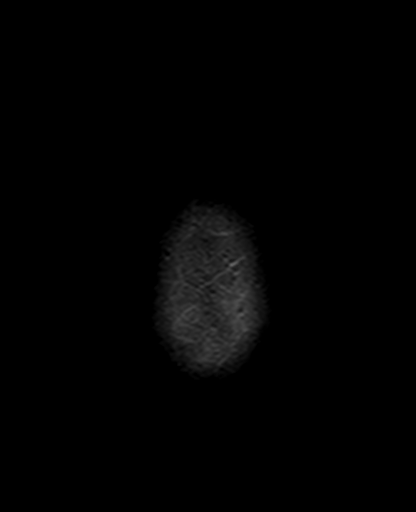

[Series 18: T2 · coronal · 5.0mm · 0.34mm/px · 2 of 29 slices shown (2 of 2)]
[im 1/29]
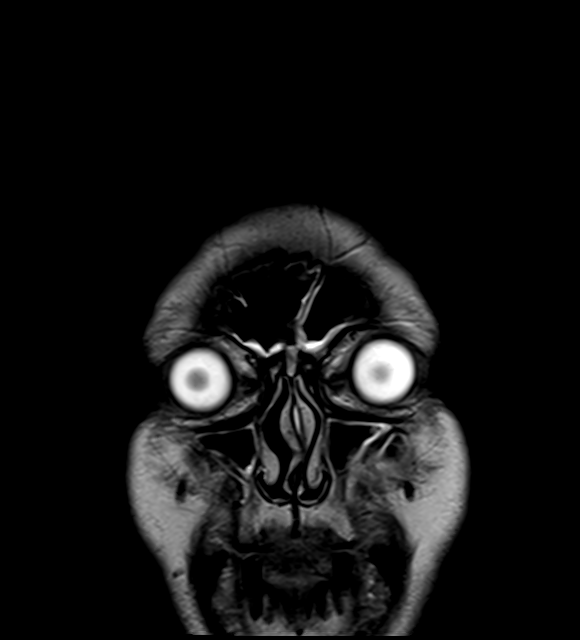
[im 29/29]
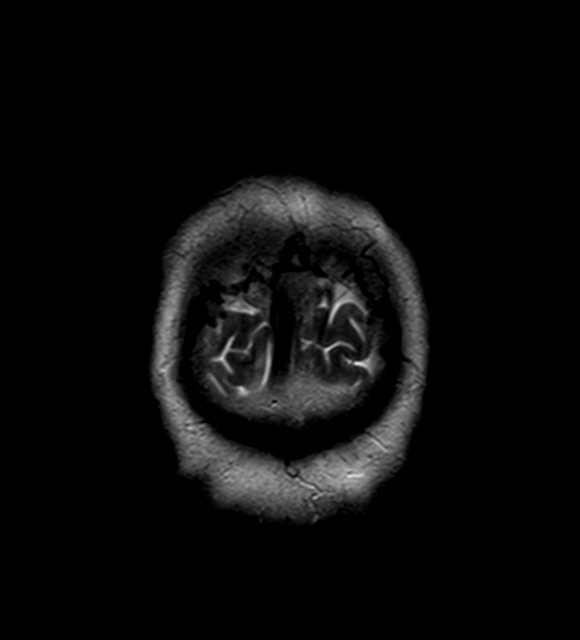

[43 of 48 positions shown; findings below may reference images not displayed]

FINDINGS: MRI HEAD FINDINGS

Brain: Examination degraded by motion artifact.

Cerebral volume within normal limits for age. No significant
cerebral white matter disease or other focal parenchymal signal
abnormality. No abnormal foci of restricted diffusion to suggest
acute or subacute ischemia. Gray-white matter differentiation
maintained. No encephalomalacia to suggest chronic cortical
infarction. No evidence for acute or chronic intracranial
hemorrhage.

No mass lesion, midline shift or mass effect. No hydrocephalus or
extra-axial fluid collection. Pituitary gland suprasellar region
within normal limits. Midline structures intact.

Vascular: Major intracranial vascular flow voids are maintained.

Skull and upper cervical spine: Craniocervical junction within
normal limits. Bone marrow signal intensity normal. No scalp soft
tissue abnormality.

Sinuses/Orbits: Globes and orbital soft tissues demonstrate no acute
finding. Scattered mucosal thickening noted throughout the paranasal
sinuses with superimposed air-fluid levels within the maxillary
sinuses. Trace right mastoid effusion, of doubtful significance.
Inner ear structures grossly normal.

Other: None.

MRA HEAD FINDINGS

Anterior circulation: Examination degraded by motion.

Visualized distal cervical segments of the internal carotid arteries
are patent with antegrade flow. Petrous, cavernous, and supraclinoid
segments patent without stenosis or other abnormality. A1 segments
widely patent. Normal anterior communicating artery complex.
Anterior cerebral arteries patent to their distal aspects without
stenosis. No M1 stenosis or occlusion. Normal MCA bifurcations.
Distal MCA branches well perfused and symmetric.

Posterior circulation: Both vertebral arteries patent to the
vertebrobasilar junction without stenosis. Left vertebral artery
dominant. Neither PICA origin well visualized. Basilar patent to its
distal aspect without stenosis. Superior cerebellar arteries patent
bilaterally. Left PCA supplied via the basilar. Fetal type origin of
the right PCA. Both PCAs perfused to distal aspects without
stenosis.

Anatomic variants: Fetal type origin of the right PCA.  No aneurysm.

MRA NECK FINDINGS

Aortic arch: Visualized aortic arch normal in caliber with normal 3
vessel morphology. No hemodynamically significant stenosis or other
abnormality seen about the origin of the great vessels.

Right carotid system: Right common and internal carotid arteries
patent without stenosis, evidence for dissection or occlusion.

Left carotid system: Left common and internal carotid arteries
patent without stenosis, evidence for dissection or occlusion.

Vertebral arteries: Both vertebral arteries arise from the
subclavian arteries. No proximal subclavian artery stenosis. Left
vertebral artery dominant. Possible short-segment moderate stenosis
noted at the proximal right vertebral artery just distal to the
origin (series [8S], image 4). Vertebral arteries otherwise patent
within the neck without stenosis, evidence for dissection or
occlusion.

Other: None
IMPRESSION: MRI HEAD IMPRESSION:

1. Normal brain MRI for age.  No acute intracranial abnormality.
2. Acute bilateral maxillary sinusitis.

MRA HEAD IMPRESSION:

Normal intracranial MRA. No large vessel occlusion, hemodynamically
significant stenosis, or other acute vascular abnormality. No
aneurysm.

MRA NECK IMPRESSION:

1. Wide patency of both carotid artery systems within the neck.
2. Possible short-segment moderate stenosis involving the proximal
right V1 segment. Vertebral arteries otherwise widely patent within
the neck. Left vertebral artery dominant.
3. No evidence for dissection or other acute vascular abnormality.

## 2021-01-23 MED ORDER — SODIUM CHLORIDE 0.9 % IV BOLUS
1000.0000 mL | Freq: Once | INTRAVENOUS | Status: AC
  Administered 2021-01-23: 1000 mL via INTRAVENOUS

## 2021-01-23 MED ORDER — BOOST PLUS PO LIQD
237.0000 mL | Freq: Three times a day (TID) | ORAL | Status: DC
  Administered 2021-01-24: 237 mL via ORAL
  Filled 2021-01-23 (×5): qty 237

## 2021-01-23 MED ORDER — ADULT MULTIVITAMIN W/MINERALS CH
1.0000 | ORAL_TABLET | Freq: Every day | ORAL | Status: DC
  Administered 2021-01-23 – 2021-01-24 (×2): 1 via ORAL
  Filled 2021-01-23 (×2): qty 1

## 2021-01-23 MED ORDER — BOOST / RESOURCE BREEZE PO LIQD CUSTOM
1.0000 | Freq: Three times a day (TID) | ORAL | Status: DC
  Administered 2021-01-23: 1 via ORAL

## 2021-01-23 MED ORDER — ATORVASTATIN CALCIUM 40 MG PO TABS
40.0000 mg | ORAL_TABLET | Freq: Every day | ORAL | Status: DC
  Administered 2021-01-23 – 2021-01-24 (×2): 40 mg via ORAL
  Filled 2021-01-23 (×2): qty 1

## 2021-01-23 MED ORDER — HYDROMORPHONE HCL 1 MG/ML IJ SOLN
1.0000 mg | INTRAMUSCULAR | Status: DC | PRN
  Administered 2021-01-23: 1 mg via INTRAVENOUS
  Filled 2021-01-23: qty 1

## 2021-01-23 MED ORDER — DIPHENHYDRAMINE HCL 50 MG/ML IJ SOLN
12.5000 mg | Freq: Once | INTRAMUSCULAR | Status: AC
  Administered 2021-01-23: 12.5 mg via INTRAVENOUS
  Filled 2021-01-23: qty 1

## 2021-01-23 MED ORDER — GADOBUTROL 1 MMOL/ML IV SOLN
7.8000 mL | Freq: Once | INTRAVENOUS | Status: AC | PRN
  Administered 2021-01-23: 7.8 mL via INTRAVENOUS

## 2021-01-23 MED ORDER — PROCHLORPERAZINE EDISYLATE 10 MG/2ML IJ SOLN
10.0000 mg | Freq: Four times a day (QID) | INTRAMUSCULAR | Status: DC | PRN
  Administered 2021-01-23 (×3): 10 mg via INTRAVENOUS
  Filled 2021-01-23 (×3): qty 2

## 2021-01-23 MED ORDER — POTASSIUM CHLORIDE 10 MEQ/100ML IV SOLN
10.0000 meq | INTRAVENOUS | Status: AC
  Administered 2021-01-23 (×3): 10 meq via INTRAVENOUS
  Filled 2021-01-23 (×3): qty 100

## 2021-01-23 MED ORDER — ASPIRIN 325 MG PO TABS
325.0000 mg | ORAL_TABLET | Freq: Every day | ORAL | Status: DC
  Administered 2021-01-23 – 2021-01-24 (×2): 325 mg via ORAL
  Filled 2021-01-23 (×2): qty 1

## 2021-01-23 NOTE — Progress Notes (Signed)
Family Medicine Teaching Service Daily Progress Note Intern Pager: [phone]  Patient name: Samuel Abbott Medical record number: [id number] Date of birth: 19-Jan-1961 Age: 60 y.o. Gender: male  Primary Care Provider: Norm Salt, PA Consultants: None Code Status: Full  Pt Overview and Major Events to Date:  7/14- Admitted  Assessment and Plan: Patient presenting with acute pancreatitis likely due to alcohol use disorder.  CIWA's have remained low.  Past medical history significant for pancreatitis, elevated triglycerides, alcohol use disorder, hypertension, and tobacco use.  Acute Pancreatitis Triglycerides 169.  Likely due to alcohol use.  Had epside of emesis last night.  Indicated Morphine caused nausea.  Indicated previously Dilaudid was before. Looks dry on exam. - Dilaudid 1 mg q3hr PRN - Repeat IV Bolus - Increase maintenance fluids to 163mL/hr - Attempt to start clear liquids as able  Alcohol Use Disorder CIWA's in 1-2 so far. - Continue CIWA without Ativan    Elevated A1C [place] elevated in setting of pancreatitis.   - Follow-up outpatient.  Hypokalemia K-3.4.   - K Repleted  TIA vs Stroke Patient mentioned left sided facial droop in addition to numbness and tingling in face and extremities yesterday.  Cranial Nerves 2-12 intact.  No focal motor or sensory deficits.  Normal neuro exam.  Given facial droop will plan for TIA work-up. - Aspirin 325 mg - Atorvastatin 80 mg - CT vs MRI imaging - ECHO - continue to monitor on telemetry  FEN/GI: NPO PPx: Refusing Lovenox, will start SCD's Dispo:Home in 2-3 days. Barriers include Pancreatitis.   Subjective:  Patient indicates not currently having pain.  Has   Objective: Temp:  [98.2 F (36.8 C)-98.7 F (37.1 C)] 98.4 F (36.9 C) (07/14 0413) Pulse Rate:  [50-80] 75 (07/14 0413) Resp:  [14-24] 14 (07/14 0413) BP: (130-190)/(68-124) 167/83 (07/14 0413) SpO2:  [95 %-100 %] 95 % (07/14 0413) Weight:   [78 kg] 78 kg (07/13 2022)  Physical Exam: General: NAD, sitting comfortably ENT:  Mucous membranes somewhat dry Cardiovascular: Regular rate and rhythm, heart sounds normal Abdomen: Tenderness to palpation in LLQ and RLQ Neuro: Alert and Oriented x4, Cranial Nerves 2-12 intact.  No focal motor or sensory deficits.  Laboratory: Recent Labs  Lab 01/22/21 1000 01/23/21 0116  WBC 11.2* 11.3*  HGB 14.2 12.8*  HCT 42.0 37.2*  PLT 256 221   Recent Labs  Lab 01/22/21 1000 01/23/21 0116  NA 137 136  K 4.2 3.4*  CL 103 108  CO2 20* 22  BUN 14 12  CREATININE 0.92 0.74  CALCIUM 9.6 8.8*  PROT 6.7 6.1*  BILITOT 0.9 0.4  ALKPHOS 59 57  ALT 13 12  AST 19 15  GLUCOSE 195* 140*    A1C- 6.7 LDL-120   Imaging/Diagnostic Tests: ADDENDUM REPORT: 01/23/2021 06:13   ADDENDUM: Not mentioned above:   Subacute right posterior ninth and eleventh rib fractures.     Electronically Signed   By: Elige Ko   On: 01/23/2021 06:13  Ysabel Cowgill, Loistine Chance, MD 01/23/2021, 7:05 AM PGY-1, [facility] Family Medicine FPTS Intern pager: [phone], text pages welcome

## 2021-01-23 NOTE — Progress Notes (Signed)
Initial Nutrition Assessment  DOCUMENTATION CODES:  Non-severe (moderate) malnutrition in context of acute illness/injury  INTERVENTION:  Add Boost Breeze po TID, each supplement provides 250 kcal and 9 grams of protein.  Once pt's diet advances to full liquids, add Boost Plus po TID, each supplement provides 360 kcal and 14 grams of protein.  Add CIWA protocol.  NUTRITION DIAGNOSIS:  Moderate Malnutrition related to acute illness (pancreatitis) as evidenced by moderate fat depletion, moderate muscle depletion.  GOAL:  Patient will meet greater than or equal to 90% of their needs  MONITOR:  Diet advancement, PO intake, Supplement acceptance, Labs, Weight trends, I & O's  REASON FOR ASSESSMENT:  Malnutrition Screening Tool    ASSESSMENT:  60 yo male with a PMH of pancreatitis, hypertriglyceridemia, EtOH use disorder, HTN, and tobacco use who presents with acute pancreatitis.  Pt reports decreased appetite because he works outside and the heat makes him not want to eat.  Pt endorses a 10 lb weight loss since the beginning of June 2022. Pt with no recent weight history in Epic or Care Everywhere. He reports that his weight is usually down in the summer and he gains it back in the winter. This has been a cycle that has occurred for several years. Per limited history in Epic, pt's weight has not significantly changed in the past 2 years.  On exam, pt's muscle stores are not depleted with the except of his legs - some moderate depletion in those. Fat stores also have some moderate depletions.  Given the above information, pt is moderately malnourished in the acute setting.  Recommend adding Boost Breeze TID while pt is on clears, and once pt transitions to fulls, discontinue Boost Breeze and add Boost Plus TID. Also recommend starting CIWA protocol given pt's EtOH abuse history.  Medications: reviewed; Compazine PRN (given twice today)  Labs: reviewed; K 3.4 (L), Glucose 140  (H)  NUTRITION - FOCUSED PHYSICAL EXAM: Flowsheet Row Most Recent Value  Orbital Region Moderate depletion  Upper Arm Region No depletion  Thoracic and Lumbar Region No depletion  Buccal Region Moderate depletion  Temple Region Moderate depletion  Clavicle Bone Region No depletion  Clavicle and Acromion Bone Region No depletion  Scapular Bone Region No depletion  Dorsal Hand No depletion  Patellar Region Moderate depletion  Anterior Thigh Region Moderate depletion  Posterior Calf Region Moderate depletion  Edema (RD Assessment) None  Hair Reviewed  Eyes Reviewed  Mouth Reviewed  Skin Reviewed  Nails Reviewed   Diet Order:   Diet Order             Diet clear liquid Room service appropriate? Yes; Fluid consistency: Thin  Diet effective now                  EDUCATION NEEDS:  Education needs have been addressed  Skin:  Skin Assessment: Reviewed RN Assessment  Last BM:  01/22/21  Height:  Ht Readings from Last 1 Encounters:  01/22/21 6' (1.829 m)   Weight:  Wt Readings from Last 1 Encounters:  01/22/21 78 kg   BMI:  Body mass index is 23.32 kg/m.  Estimated Nutritional Needs:  Kcal:  2100-2300 Protein:  90-105 grams Fluid:  >2 L  Vertell Limber, RD, LDN (she/her/hers) Registered Dietitian I After-Hours/Weekend Pager # in [place]

## 2021-01-23 NOTE — Plan of Care (Addendum)
Pain and nausea control. Patient is NPO

## 2021-01-23 NOTE — Hospital Course (Addendum)
Acute Pancreatitis Patient presented with severe abdominal pain with nausea and vomiting.  Found to have elevated Lipase of 522.  Abdominal CT also showed evidence of acute Pancreatitis without necrosis or psudeocyst.  Fasting Triglycerides in normal limits.  Thought likely due to patient's alcohol use.  Kept NPO and given fluid bolus x3 and kept on 200-235mL/hr IV fluids.  Pain controlled with IV Morphine and switched to Dilaudid given better tolerance.  Abdominal pain improved throughout hospital stay and had resolved by time of discharge.  Patient able to tolerate full diet by time of discharge, IV fluid stopped, and nausea and vomiting resolved.  Alcohol Use Disorder Patient reported drinking 7 or more drinks a day.  Placed on CIWA protocol without Ativan due to concern for withdrawal.  Patient received 1 mg dose Ativan x1 given due to score of 7 due to anxiety and agitiation with nurse, but no other signs or symptoms of withdrawal.  Last score before discharge was 3 and patient deemed safe for discharge.  Brief Leg, Arm, Facial Numbness  Facial droop Had episode morning prior to hospitalization occurring with severe pain.  Given facial work-up for TIA/stroke initiated on second day of hospitalization.  MRI/MRA showed no findings of stroke.  Echo showed normal EF with Stage 1 Diastolic Dysfunction. Started on Aspirin and Atorvastatin which were both continued at discharge.  All other problems chronic and stable  Discharge recommendations: Pancreatitis: Monitor for return of pain Facial droop s/p resolution concerning for TIA: started aspirin 325 mg daily, obtained imaging that was negative for stroke.  Continued on Aspirin 81 mg and Atorvastatin 40 mg at discharge. QT prolongation on admission. Recheck improved. Recommend PCP recheck.  A1c 6.7. PCP to initiate diabetes treatment. Starting Metformin 500 mg on discharge. Primary vs secondary CVA prevention: Started statin and aspirin while  inpatient. PCP to monitor for side effects. Smoking cessation: Toc consulted while inpatient. Sent home with Nicotine patches.  PCP to encourage smoking cessation and provide resources.  Alcoholism: contributed to pancreatitis. PCP to connect to resources.

## 2021-01-24 ENCOUNTER — Inpatient Hospital Stay (HOSPITAL_COMMUNITY): Payer: No Typology Code available for payment source

## 2021-01-24 ENCOUNTER — Encounter (HOSPITAL_COMMUNITY): Payer: No Typology Code available for payment source

## 2021-01-24 ENCOUNTER — Other Ambulatory Visit (HOSPITAL_COMMUNITY): Payer: Self-pay

## 2021-01-24 DIAGNOSIS — G459 Transient cerebral ischemic attack, unspecified: Secondary | ICD-10-CM

## 2021-01-24 DIAGNOSIS — K859 Acute pancreatitis without necrosis or infection, unspecified: Secondary | ICD-10-CM | POA: Diagnosis not present

## 2021-01-24 LAB — CBC
HCT: 35.4 % — ABNORMAL LOW (ref 39.0–52.0)
Hemoglobin: 11.9 g/dL — ABNORMAL LOW (ref 13.0–17.0)
MCH: 30.3 pg (ref 26.0–34.0)
MCHC: 33.6 g/dL (ref 30.0–36.0)
MCV: 90.1 fL (ref 80.0–100.0)
Platelets: 190 10*3/uL (ref 150–400)
RBC: 3.93 MIL/uL — ABNORMAL LOW (ref 4.22–5.81)
RDW: 12.3 % (ref 11.5–15.5)
WBC: 8.5 10*3/uL (ref 4.0–10.5)
nRBC: 0 % (ref 0.0–0.2)

## 2021-01-24 LAB — BASIC METABOLIC PANEL
Anion gap: 6 (ref 5–15)
BUN: 7 mg/dL (ref 6–20)
CO2: 24 mmol/L (ref 22–32)
Calcium: 8.8 mg/dL — ABNORMAL LOW (ref 8.9–10.3)
Chloride: 104 mmol/L (ref 98–111)
Creatinine, Ser: 0.73 mg/dL (ref 0.61–1.24)
GFR, Estimated: >60 mL/min (ref >60)
Glucose, Bld: 103 mg/dL — ABNORMAL HIGH (ref 70–99)
Potassium: 3.1 mmol/L — ABNORMAL LOW (ref 3.5–5.1)
Sodium: 134 mmol/L — ABNORMAL LOW (ref 135–145)

## 2021-01-24 LAB — ECHOCARDIOGRAM COMPLETE
AR max vel: 2.94 cm2
AV Area VTI: 2.91 cm2
AV Area mean vel: 2.71 cm2
AV Mean grad: 4 mmHg
AV Peak grad: 7.5 mmHg
Ao pk vel: 1.37 m/s
Area-P 1/2: 2.6 cm2
Height: 72 in
S' Lateral: 3 cm
Weight: 2751.34 oz

## 2021-01-24 MED ORDER — POTASSIUM CHLORIDE CRYS ER 20 MEQ PO TBCR
40.0000 meq | EXTENDED_RELEASE_TABLET | ORAL | Status: AC
  Administered 2021-01-24 (×2): 40 meq via ORAL
  Filled 2021-01-24: qty 2

## 2021-01-24 MED ORDER — ATORVASTATIN CALCIUM 40 MG PO TABS
40.0000 mg | ORAL_TABLET | Freq: Every day | ORAL | 1 refills | Status: DC
  Filled 2021-01-24: qty 30, 30d supply, fill #0

## 2021-01-24 MED ORDER — METFORMIN HCL 500 MG PO TABS
500.0000 mg | ORAL_TABLET | Freq: Every day | ORAL | 0 refills | Status: AC
  Filled 2021-01-24: qty 30, 30d supply, fill #0

## 2021-01-24 MED ORDER — LORAZEPAM 2 MG/ML IJ SOLN
1.0000 mg | Freq: Once | INTRAMUSCULAR | Status: AC
  Administered 2021-01-24: 1 mg via INTRAVENOUS
  Filled 2021-01-24: qty 1

## 2021-01-24 MED ORDER — ASPIRIN 81 MG PO TBEC
81.0000 mg | DELAYED_RELEASE_TABLET | Freq: Every day | ORAL | 0 refills | Status: AC
  Filled 2021-01-24: qty 30, 30d supply, fill #0

## 2021-01-24 MED ORDER — POTASSIUM CHLORIDE CRYS ER 20 MEQ PO TBCR
40.0000 meq | EXTENDED_RELEASE_TABLET | Freq: Two times a day (BID) | ORAL | Status: DC
  Filled 2021-01-24: qty 2

## 2021-01-24 MED ORDER — NICOTINE 21 MG/24HR TD PT24
21.0000 mg | MEDICATED_PATCH | Freq: Every day | TRANSDERMAL | 0 refills | Status: DC
  Filled 2021-01-24: qty 28, 28d supply, fill #0

## 2021-01-24 NOTE — Progress Notes (Signed)
Received page that patient nurse had concern of patient's elevated CIWA of 7 and beginning to act agitated.  Did a quick chart review to see that the patient had not had any Dilaudid or other narcotics since last night.  Placed an order for 1 mg Ativan IV for concern of alcohol withdrawal.  On admission the patient did endorse drinking at least 7 alcoholic beverages per day and so my concern for alcohol withdrawal is high.  Informed the nurse to hold off on his pain medication until speaking with [place] and that we would have a provider see him in the next few minutes.

## 2021-01-24 NOTE — Progress Notes (Signed)
[facility] is here. NT has taken pt down to front lobby. Pt alert and oriented x4 in no acute distress. Pt has taken all of his belongings.

## 2021-01-24 NOTE — Progress Notes (Signed)
CSW met with patient. He appears to be anxious to discharge at this time and declined resources from [place]. RN assisting to obtain a work note. He requested transportation to his car at the [employer] parking lot in [place]. CSW obtaining [facility] to :[place], [employer] (confirmed address from EMS run sheet pickup location).   Gilmore Laroche, MSW, [facility]

## 2021-01-24 NOTE — Progress Notes (Addendum)
FMTS Attending Daily Note: Samuel Saver, MD  Team Pager [phone] Pager [phone] I have seen and examined this patient, reviewed their chart. I have discussed this patient with the resident. I agree with the resident's findings, assessment and care plan.  Mr Samuel Abbott viewed at bedside today this afternoon, without tremors, agitation, sweating, or other signs of withdrawal. Noted by Dr Samuel Abbott earlier this AM to be wanting to go home but no other signs of agitation or signs of withdrawal prior to receiving any Ativan, thus suspecting earlier CIWA to be false. He denies abdominal pain, vomiting, and is eating a salad with dressing and ice cream. IVF discontinued this AM. Discussed if he tolerates PO well we are fine with charging to home, and importance of abstinence from alcohol for his pancreatitis.   TIA work up negative for acute stroke or other cause, discussed I am not 100% sure that this is a true TIA and that we cannot be sure but work up including MRI/MRA, ECHO negative at this time. Through shared decision making, decided to continue aspirin 81mg  going forward as if this was a low risk TIA. Also continue statin, and will start metformin.      Family Medicine Teaching Service Daily Progress Note Intern Pager: [phone]  Patient name: Samuel Abbott Medical record number: Samuel Abbott Date of birth: 1960-09-05 Age: 60 y.o. Gender: male  Primary Care Provider: 60, PA Consultants: None Code Status: Full  Pt Overview and Major Events to Date:  7/14- Admitted  Assessment and Plan: Patient presented with acute pancreatitis.  Discussed importance of alcohol and smoking cessation.  Patient expressed understanding.  Possible discharge today if able to tolerate p.o. and improved clinical picture.  Acute Pancreatitis Pain greatly improved today.  Also able to take in some PO.  Received 1 dose of IV Dilaudid last night.  Patient able to eat some solid food this AM. - Will D/C IV  fluids and pain meds and see how patient tolerates regular diet for possible discharge today - Continue to advance diet as tolerated  Hypokalemia K-3.1.  Likely due to IV fluids. - Replete with Oral K  Possible TIA MRI/MRA negative for stroke.  ECHO results pending. - follow up ECHO - Continue Atorvastatin 40 mg  Alcohol Use Disorder Patient had CIWA recorded of 7 this AM given reported agitation and anxiety.  Saw patient before ativan administration and was calm and resting comfortably.  No tremor or seizure.  Received 1 mg Ativan but not concerned for withdrawal. - Continue CIWA monitoring without Ativan   FEN/GI: Advance as tolerated PPx: SCD's Dispo:Home tomorrow. Barriers include Pancreatitis.   Subjective:  Patient indicates feeling well.  Eating poptart and feels can continue to eat has strong desire to go home.  Objective: Temp:  [97.6 F (36.4 C)-98.6 F (37 C)] 98.3 F (36.8 C) (07/15 0724) Pulse Rate:  [60-70] 64 (07/15 0724) Resp:  [15-19] 18 (07/15 0724) BP: (127-181)/(71-94) 151/82 (07/15 0724) SpO2:  [96 %-100 %] 100 % (07/15 0724) Physical Exam: General: NAD, resting comfortably and sitting up in chair Cardiovascular: Regular Rate and Rhythm, Normal heart sounds Abdomen: No tenderness on exam with deep palpation Neuro: Alert and oriented x 4, no tremor  Laboratory: Recent Labs  Lab 01/22/21 1000 01/23/21 0116 01/24/21 0707  WBC 11.2* 11.3* 8.5  HGB 14.2 12.8* 11.9*  HCT 42.0 37.2* 35.4*  PLT 256 221 190   Recent Labs  Lab 01/22/21 1000 01/23/21 0116 01/24/21 0707  NA 137  136 134*  K 4.2 3.4* 3.1*  CL 103 108 104  CO2 20* 22 24  BUN 14 12 7   CREATININE 0.92 0.74 0.73  CALCIUM 9.6 8.8* 8.8*  PROT 6.7 6.1*  --   BILITOT 0.9 0.4  --   ALKPHOS 59 57  --   ALT 13 12  --   AST 19 15  --   GLUCOSE 195* 140* 103*     Imaging/Diagnostic Tests: EXAM: MRI HEAD WITHOUT CONTRAST   MRA HEAD WITHOUT CONTRAST   MRA NECK WITHOUT AND WITH  CONTRAST   TECHNIQUE: Multiplanar, multi-echo pulse sequences of the brain and surrounding structures were acquired without intravenous contrast. Angiographic images of the Circle of Willis were acquired using MRA technique without intravenous contrast. Angiographic images of the neck were acquired using MRA technique without and with intravenous contrast. Carotid stenosis measurements (when applicable) are obtained utilizing NASCET criteria, using the distal internal carotid diameter as the denominator.   CONTRAST:  7.84mL GADAVIST GADOBUTROL 1 MMOL/ML IV SOLN   COMPARISON:  None available.   FINDINGS: MRI HEAD FINDINGS   Brain: Examination degraded by motion artifact.   Cerebral volume within normal limits for age. No significant cerebral white matter disease or other focal parenchymal signal abnormality. No abnormal foci of restricted diffusion to suggest acute or subacute ischemia. Gray-white matter differentiation maintained. No encephalomalacia to suggest chronic cortical infarction. No evidence for acute or chronic intracranial hemorrhage.   No mass lesion, midline shift or mass effect. No hydrocephalus or extra-axial fluid collection. Pituitary gland suprasellar region within normal limits. Midline structures intact.   Vascular: Major intracranial vascular flow voids are maintained.   Skull and upper cervical spine: Craniocervical junction within normal limits. Bone marrow signal intensity normal. No scalp soft tissue abnormality.   Sinuses/Orbits: Globes and orbital soft tissues demonstrate no acute finding. Scattered mucosal thickening noted throughout the paranasal sinuses with superimposed air-fluid levels within the maxillary sinuses. Trace right mastoid effusion, of doubtful significance. Inner ear structures grossly normal.   Other: None.   MRA HEAD FINDINGS   Anterior circulation: Examination degraded by motion.   Visualized distal cervical segments  of the internal carotid arteries are patent with antegrade flow. Petrous, cavernous, and supraclinoid segments patent without stenosis or other abnormality. A1 segments widely patent. Normal anterior communicating artery complex. Anterior cerebral arteries patent to their distal aspects without stenosis. No M1 stenosis or occlusion. Normal MCA bifurcations. Distal MCA branches well perfused and symmetric.   Posterior circulation: Both vertebral arteries patent to the vertebrobasilar junction without stenosis. Left vertebral artery dominant. Neither PICA origin well visualized. Basilar patent to its distal aspect without stenosis. Superior cerebellar arteries patent bilaterally. Left PCA supplied via the basilar. Fetal type origin of the right PCA. Both PCAs perfused to distal aspects without stenosis.   Anatomic variants: Fetal type origin of the right PCA.  No aneurysm.   MRA NECK FINDINGS   Aortic arch: Visualized aortic arch normal in caliber with normal 3 vessel morphology. No hemodynamically significant stenosis or other abnormality seen about the origin of the great vessels.   Right carotid system: Right common and internal carotid arteries patent without stenosis, evidence for dissection or occlusion.   Left carotid system: Left common and internal carotid arteries patent without stenosis, evidence for dissection or occlusion.   Vertebral arteries: Both vertebral arteries arise from the subclavian arteries. No proximal subclavian artery stenosis. Left vertebral artery dominant. Possible short-segment moderate stenosis noted at the proximal right vertebral  artery just distal to the origin (series 1110, image 4). Vertebral arteries otherwise patent within the neck without stenosis, evidence for dissection or occlusion.   Other: None   IMPRESSION: MRI HEAD IMPRESSION:   1. Normal brain MRI for age.  No acute intracranial abnormality. 2. Acute bilateral maxillary  sinusitis.   MRA HEAD IMPRESSION:   Normal intracranial MRA. No large vessel occlusion, hemodynamically significant stenosis, or other acute vascular abnormality. No aneurysm.   MRA NECK IMPRESSION:   1. Wide patency of both carotid artery systems within the neck. 2. Possible short-segment moderate stenosis involving the proximal right V1 segment. Vertebral arteries otherwise widely patent within the neck. Left vertebral artery dominant. 3. No evidence for dissection or other acute vascular abnormality.  Jovita Kussmaul, MD 01/24/2021, 10:17 AM PGY-1, [facility] Family Medicine FPTS Intern pager: [phone], text pages welcome

## 2021-01-24 NOTE — Discharge Instructions (Signed)
Dear Samuel Abbott,   Thank you for letting us participate in your care! In this section, you will find a brief hospital admission summary of why you were admitted to the hospital, what happened during your admission, your diagnosis/diagnoses, and recommended follow up.  You were admitted because you were experiencing Abdominal Pain.  Your testing revealed Pancreatitis.  You were treated with IV fluids and pain medication  Your  improved and you were discharged from the hospital for meeting this goal.    POST-HOSPITAL & CARE INSTRUCTIONS Recommend working on stopping drinking alcohol to avoid future hospitalizations for Pancreatitis.  I also recommend continuing to use nicotine patch and avoiding smoking.  I recommend discussing both of these with your primary doctor to help with both these. Please let PCP/Specialists know of any changes in medications that were made.  Please see medications section of this packet for any medication changes.   DOCTOR'S APPOINTMENTS & FOLLOW UP   Thank you for choosing [facility]! Take care and be well!  Family Medicine Teaching Service Inpatient Team [place]  [facility]  [address] [place], [place] [address] [phone]

## 2021-01-24 NOTE — Progress Notes (Signed)
Discharge paperwork reviewed with pt. Pt verbalized understanding. [facility] transport to transport pt to his vehicle that was left at his job and pt will transport himself home. Awaiting for transport to arrive.

## 2021-01-25 NOTE — Discharge Summary (Signed)
Family Medicine Teaching [facility] Discharge Summary  Patient name: Samuel Abbott Medical record number: [id number] Date of birth: 1961-02-11 Age: 60 y.o. Gender: male Date of Admission: 01/22/2021  Date of Discharge: 01/24/21 Admitting Physician: Jovita Kussmaul, MD  Primary Care Provider: Norm Salt, PA Consultants: None  Indication for Hospitalization: Pancreatitis  Discharge Diagnoses/Problem List:  Pancreatitis, Hyperlipidemia, Alcohol Use Disorder, Hypertension, and Tobacco Use.   Disposition: Able to be discharged home safely  Discharge Condition: Stable  Discharge Exam:  General: NAD, resting comfortably and sitting up in chair Cardiovascular: Regular Rate and Rhythm, Normal heart sounds Abdomen: No tenderness on exam with deep palpation Neuro: Alert and oriented x 4, no tremor  Brief Hospital Course:  Acute Pancreatitis Patient presented with severe abdominal pain with nausea and vomiting.  Found to have elevated Lipase of 522.  Abdominal CT also showed evidence of acute Pancreatitis without necrosis or psudeocyst.  Fasting Triglycerides in normal limits.  Thought likely due to patient's alcohol use.  Kept NPO and given fluid bolus x3 and kept on 200-273mL/hr IV fluids.  Pain controlled with IV Morphine and switched to Dilaudid given better tolerance.  Abdominal pain improved throughout hospital stay and had resolved by time of discharge.  Patient able to tolerate full diet by time of discharge, IV fluid stopped, and nausea and vomiting resolved.  Alcohol Use Disorder Patient reported drinking 7 or more drinks a day.  Placed on CIWA protocol without Ativan due to concern for withdrawal.  Patient received 1 mg dose Ativan x1 given due to score of 7 due to anxiety and agitiation with nurse, but no other signs or symptoms of withdrawal.  Last score before discharge was 3 and patient deemed safe for discharge.  Brief Leg, Arm, Facial Numbness  Facial droop Had  episode morning prior to hospitalization occurring with severe pain.  Given facial work-up for TIA/stroke initiated on second day of hospitalization.  MRI/MRA showed no findings of stroke.  Echo showed normal EF with Stage 1 Diastolic Dysfunction. Started on Aspirin and Atorvastatin which were both continued at discharge.  All other problems chronic and stable  Discharge recommendations: Pancreatitis: Monitor for return of pain Facial droop s/p resolution concerning for TIA: started aspirin 325 mg daily, obtained imaging that was negative for stroke.  Continued on Aspirin 81 mg and Atorvastatin 40 mg at discharge. QT prolongation on admission. Recheck improved. Recommend PCP recheck.  A1c 6.7. PCP to initiate diabetes treatment. Starting Metformin 500 mg on discharge. Primary vs secondary CVA prevention: Started statin and aspirin while inpatient. PCP to monitor for side effects. Smoking cessation: Toc consulted while inpatient. Sent home with Nicotine patches.  PCP to encourage smoking cessation and provide resources.  Alcoholism: contributed to pancreatitis. PCP to connect to resources.    Significant Procedures: ECHO  ECHOCARDIOGRAM REPORT         Patient Name:   Samuel Abbott Date of Exam: 01/24/2021  Medical Rec #:  [id number]        Height:       72.0 in  Accession #:    [id number]       Weight:       172.0 lb  Date of Birth:  04-14-1961         BSA:          1.998 m  Patient Age:    59 years         BP:  127/71 mmHg  Patient Gender: M                HR:           67 bpm.  Exam Location:  Inpatient   Procedure: 2D Echo, Cardiac Doppler and Color Doppler   Indications:    TIA     History:        Patient has no prior history of Echocardiogram  examinations.                  Risk Factors:Hypertension and Current Smoker.     Sonographer:    Shirlean Kelly  Referring Phys: Lyndel Pleasure Chrissie Noa A HENSEL   IMPRESSIONS     1. Left ventricular ejection fraction, by  estimation, is 60 to 65%. The  left ventricle has normal function. The left ventricle has no regional  wall motion abnormalities. Left ventricular diastolic parameters are  consistent with Grade I diastolic  dysfunction (impaired relaxation).   2. Right ventricular systolic function is normal. The right ventricular  size is normal. Tricuspid regurgitation signal is inadequate for assessing  PA pressure.   3. The mitral valve is normal in structure. No evidence of mitral valve  regurgitation. No evidence of mitral stenosis.   4. The aortic valve is tricuspid. Aortic valve regurgitation is not  visualized. No aortic stenosis is present.   5. The inferior vena cava is normal in size with greater than 50%  respiratory variability, suggesting right atrial pressure of 3 mmHg.   FINDINGS   Left Ventricle: Left ventricular ejection fraction, by estimation, is 60  to 65%. The left ventricle has normal function. The left ventricle has no  regional wall motion abnormalities. The left ventricular internal cavity  size was normal in size. There is   no left ventricular hypertrophy. Left ventricular diastolic parameters  are consistent with Grade I diastolic dysfunction (impaired relaxation).   Right Ventricle: The right ventricular size is normal. No increase in  right ventricular wall thickness. Right ventricular systolic function is  normal. Tricuspid regurgitation signal is inadequate for assessing PA  pressure.   Left Atrium: Left atrial size was normal in size.   Right Atrium: Right atrial size was normal in size.   Pericardium: There is no evidence of pericardial effusion.   Mitral Valve: The mitral valve is normal in structure. No evidence of  mitral valve regurgitation. No evidence of mitral valve stenosis.   Tricuspid Valve: The tricuspid valve is normal in structure. Tricuspid  valve regurgitation is not demonstrated.   Aortic Valve: The aortic valve is tricuspid. Aortic valve  regurgitation is  not visualized. No aortic stenosis is present. Aortic valve mean gradient  measures 4.0 mmHg. Aortic valve peak gradient measures 7.5 mmHg. Aortic  valve area, by VTI measures 2.91  cm.   Pulmonic Valve: The pulmonic valve was normal in structure. Pulmonic valve  regurgitation is not visualized.   Aorta: The aortic root is normal in size and structure.   Venous: The inferior vena cava is normal in size with greater than 50%  respiratory variability, suggesting right atrial pressure of 3 mmHg.   IAS/Shunts: No atrial level shunt detected by color flow Doppler.      LEFT VENTRICLE  PLAX 2D  LVIDd:         4.80 cm  Diastology  LVIDs:         3.00 cm  LV e' medial:    9.90 cm/s  LV PW:  1.10 cm  LV E/e' medial:  7.4  LV IVS:        1.10 cm  LV e' lateral:   9.03 cm/s  LVOT diam:     2.00 cm  LV E/e' lateral: 8.1  LV SV:         90  LV SV Index:   45  LVOT Area:     3.14 cm      RIGHT VENTRICLE  RV Basal diam:  3.70 cm  RV S prime:     13.40 cm/s  TAPSE (M-mode): 2.7 cm   LEFT ATRIUM             Index       RIGHT ATRIUM           Index  LA diam:        3.60 cm 1.80 cm/m  RA Area:     16.80 cm  LA Vol (A2C):   55.2 ml 27.63 ml/m RA Volume:   42.70 ml  21.37 ml/m  LA Vol (A4C):   54.4 ml 27.23 ml/m  LA Biplane Vol: 56.5 ml 28.28 ml/m   AORTIC VALVE  AV Area (Vmax):    2.94 cm  AV Area (Vmean):   2.71 cm  AV Area (VTI):     2.91 cm  AV Vmax:           137.00 cm/s  AV Vmean:          90.700 cm/s  AV VTI:            0.308 m  AV Peak Grad:      7.5 mmHg  AV Mean Grad:      4.0 mmHg  LVOT Vmax:         128.00 cm/s  LVOT Vmean:        78.100 cm/s  LVOT VTI:          0.285 m  LVOT/AV VTI ratio: 0.93     AORTA  Ao Root diam: 3.40 cm  Ao Asc diam:  3.40 cm   MITRAL VALVE  MV Area (PHT): 2.60 cm    SHUNTS  MV Decel Time: 292 msec    Systemic VTI:  0.29 m  MV E velocity: 72.80 cm/s  Systemic Diam: 2.00 cm  MV A velocity: 81.20 cm/s  MV  E/A ratio:  0.90   Marca Anconaalton Mclean MD  Electronically signed by Marca Anconaalton Mclean MD  Signature Date/Time: 01/24/2021/11:40:19 AM   Significant Labs and Imaging:  Recent Labs  Lab 01/22/21 1000 01/23/21 0116 01/24/21 0707  WBC 11.2* 11.3* 8.5  HGB 14.2 12.8* 11.9*  HCT 42.0 37.2* 35.4*  PLT 256 221 190   Recent Labs  Lab 01/22/21 1000 01/23/21 0116 01/24/21 0707  NA 137 136 134*  K 4.2 3.4* 3.1*  CL 103 108 104  CO2 20* 22 24  GLUCOSE 195* 140* 103*  BUN 14 12 7   CREATININE 0.92 0.74 0.73  CALCIUM 9.6 8.8* 8.8*  ALKPHOS 59 57  --   AST 19 15  --   ALT 13 12  --   ALBUMIN 4.0 3.5  --     Lipase-522  Results/Tests Pending at Time of Discharge: None  Discharge Medications:  Allergies as of 01/24/2021       Reactions   Amoxicillin Anaphylaxis   Penicillins Anaphylaxis        Medication List     STOP taking these medications    dimenhyDRINATE 50 MG tablet  Commonly known as: DRAMAMINE   fenofibrate 145 MG tablet Commonly known as: TRICOR   gemfibrozil 600 MG tablet Commonly known as: Lopid   ibuprofen 800 MG tablet Commonly known as: ADVIL       TAKE these medications    Aspirin Low Dose 81 MG EC tablet Generic drug: aspirin Take 1 tablet (81 mg total) by mouth daily. Swallow whole.   atorvastatin 40 MG tablet Commonly known as: LIPITOR Take 1 tablet (40 mg total) by mouth daily.   DULoxetine 20 MG capsule Commonly known as: CYMBALTA Take 20 mg by mouth 2 (two) times daily.   hydrOXYzine 25 MG capsule Commonly known as: VISTARIL Take 25 mg by mouth at bedtime.   losartan 25 MG tablet Commonly known as: COZAAR Take 25 mg by mouth daily.   metFORMIN 500 MG tablet Commonly known as: Glucophage Take 1 tablet (500 mg total) by mouth daily with breakfast.   multivitamin capsule Take 1 capsule by mouth daily.   nicotine 21 mg/24hr patch Commonly known as: NICODERM CQ - dosed in mg/24 hours Place 1 patch (21 mg total) onto the skin  daily.   tiZANidine 4 MG tablet Commonly known as: ZANAFLEX Take 4 mg by mouth every 8 (eight) hours as needed for muscle spasms.        Discharge Instructions: Please refer to Patient Instructions section of EMR for full details.  Patient was counseled important signs and symptoms that should prompt return to medical care, changes in medications, dietary instructions, activity restrictions, and follow up appointments.   Follow-Up Appointments:   Jovita Kussmaul, MD 01/25/2021, 9:12 PM PGY-1, [facility] Family Medicine

## 2021-03-11 ENCOUNTER — Other Ambulatory Visit (HOSPITAL_COMMUNITY): Payer: Self-pay

## 2022-12-03 ENCOUNTER — Encounter (HOSPITAL_BASED_OUTPATIENT_CLINIC_OR_DEPARTMENT_OTHER): Payer: Self-pay

## 2022-12-03 ENCOUNTER — Other Ambulatory Visit: Payer: Self-pay

## 2022-12-03 ENCOUNTER — Other Ambulatory Visit (HOSPITAL_BASED_OUTPATIENT_CLINIC_OR_DEPARTMENT_OTHER): Payer: Self-pay

## 2022-12-03 ENCOUNTER — Emergency Department (HOSPITAL_BASED_OUTPATIENT_CLINIC_OR_DEPARTMENT_OTHER): Payer: 59

## 2022-12-03 ENCOUNTER — Emergency Department (HOSPITAL_BASED_OUTPATIENT_CLINIC_OR_DEPARTMENT_OTHER)
Admission: EM | Admit: 2022-12-03 | Discharge: 2022-12-03 | Disposition: A | Discharge status: Home / Self Care | Payer: 59 | Attending: Emergency Medicine | Admitting: Emergency Medicine

## 2022-12-03 DIAGNOSIS — Z7984 Long term (current) use of oral hypoglycemic drugs: Secondary | ICD-10-CM | POA: Diagnosis not present

## 2022-12-03 DIAGNOSIS — R1084 Generalized abdominal pain: Secondary | ICD-10-CM | POA: Diagnosis present

## 2022-12-03 DIAGNOSIS — K852 Alcohol induced acute pancreatitis without necrosis or infection: Secondary | ICD-10-CM | POA: Insufficient documentation

## 2022-12-03 DIAGNOSIS — Z79899 Other long term (current) drug therapy: Secondary | ICD-10-CM | POA: Diagnosis not present

## 2022-12-03 LAB — LIPID PANEL
Cholesterol: 220 mg/dL — ABNORMAL HIGH (ref 0–200)
HDL: 33 mg/dL — ABNORMAL LOW (ref >40)
LDL Cholesterol: 154 mg/dL — ABNORMAL HIGH (ref 0–99)
Total CHOL/HDL Ratio: 6.7 RATIO
Triglycerides: 164 mg/dL — ABNORMAL HIGH (ref <150)
VLDL: 33 mg/dL (ref 0–40)

## 2022-12-03 LAB — COMPREHENSIVE METABOLIC PANEL
ALT: 12 U/L (ref 0–44)
AST: 13 U/L — ABNORMAL LOW (ref 15–41)
Albumin: 4.9 g/dL (ref 3.5–5.0)
Alkaline Phosphatase: 83 U/L (ref 38–126)
Anion gap: 10 (ref 5–15)
BUN: 19 mg/dL (ref 6–20)
CO2: 28 mmol/L (ref 22–32)
Calcium: 10.3 mg/dL (ref 8.9–10.3)
Chloride: 96 mmol/L — ABNORMAL LOW (ref 98–111)
Creatinine, Ser: 0.71 mg/dL (ref 0.61–1.24)
GFR, Estimated: >60 mL/min (ref >60)
Glucose, Bld: 166 mg/dL — ABNORMAL HIGH (ref 70–99)
Potassium: 4.1 mmol/L (ref 3.5–5.1)
Sodium: 134 mmol/L — ABNORMAL LOW (ref 135–145)
Total Bilirubin: 1.4 mg/dL — ABNORMAL HIGH (ref 0.3–1.2)
Total Protein: 8.2 g/dL — ABNORMAL HIGH (ref 6.5–8.1)

## 2022-12-03 LAB — CBC
HCT: 44.7 % (ref 39.0–52.0)
Hemoglobin: 15.7 g/dL (ref 13.0–17.0)
MCH: 31.1 pg (ref 26.0–34.0)
MCHC: 35.1 g/dL (ref 30.0–36.0)
MCV: 88.5 fL (ref 80.0–100.0)
Platelets: 183 10*3/uL (ref 150–400)
RBC: 5.05 MIL/uL (ref 4.22–5.81)
RDW: 12 % (ref 11.5–15.5)
WBC: 11.4 10*3/uL — ABNORMAL HIGH (ref 4.0–10.5)
nRBC: 0 % (ref 0.0–0.2)

## 2022-12-03 LAB — LIPASE, BLOOD: Lipase: 1309 U/L — ABNORMAL HIGH (ref 11–51)

## 2022-12-03 LAB — URINALYSIS, ROUTINE W REFLEX MICROSCOPIC
Bilirubin Urine: NEGATIVE
Glucose, UA: NEGATIVE mg/dL
Hgb urine dipstick: NEGATIVE
Ketones, ur: 15 mg/dL — AB
Leukocytes,Ua: NEGATIVE
Nitrite: NEGATIVE
Specific Gravity, Urine: >1.046 — ABNORMAL HIGH (ref 1.005–1.030)
pH: 6.5 (ref 5.0–8.0)

## 2022-12-03 LAB — ETHANOL: Alcohol, Ethyl (B): <10 mg/dL (ref <10)

## 2022-12-03 MED ORDER — OXYCODONE-ACETAMINOPHEN 5-325 MG PO TABS: 1.0000 | ORAL_TABLET | Freq: Three times a day (TID) | ORAL | 0 refills | Status: DC | PRN

## 2022-12-03 MED ORDER — OXYCODONE-ACETAMINOPHEN 5-325 MG PO TABS
1.0000 | ORAL_TABLET | Freq: Three times a day (TID) | ORAL | 0 refills | Status: DC | PRN
  Filled 2022-12-03: qty 10, 4d supply, fill #0

## 2022-12-03 MED ORDER — ONDANSETRON 4 MG PO TBDP: 4.0000 mg | ORAL_TABLET | Freq: Three times a day (TID) | ORAL | 0 refills | Status: DC | PRN

## 2022-12-03 MED ORDER — IOHEXOL 300 MG/ML  SOLN
100.0000 mL | Freq: Once | INTRAMUSCULAR | Status: AC | PRN
  Administered 2022-12-03: 85 mL via INTRAVENOUS

## 2022-12-03 MED ORDER — HYDROMORPHONE HCL 1 MG/ML IJ SOLN
1.0000 mg | Freq: Once | INTRAMUSCULAR | Status: AC
  Administered 2022-12-03: 1 mg via INTRAVENOUS
  Filled 2022-12-03: qty 1

## 2022-12-03 MED ORDER — ONDANSETRON 4 MG PO TBDP
4.0000 mg | ORAL_TABLET | Freq: Three times a day (TID) | ORAL | 0 refills | Status: DC | PRN
  Filled 2022-12-03: qty 20, 7d supply, fill #0

## 2022-12-03 MED ORDER — LACTATED RINGERS IV BOLUS
1000.0000 mL | Freq: Once | INTRAVENOUS | Status: AC
  Administered 2022-12-03: 1000 mL via INTRAVENOUS

## 2022-12-03 MED ORDER — ONDANSETRON HCL 4 MG/2ML IJ SOLN
INTRAMUSCULAR | Status: AC
  Administered 2022-12-03: 4 mg
  Filled 2022-12-03: qty 2

## 2022-12-03 NOTE — ED Notes (Signed)
Pt given discharge instructions and reviewed prescriptions. Opportunities given for questions. Pt verbalizes understanding. PIV x1 removed.  Jillyn Hidden, RN

## 2022-12-03 NOTE — ED Triage Notes (Signed)
Patient here POV from Home.  Endorses ABD Pain since Monday. N/V. No Diarrhea. No Fevers.   History of Pancreatitis.   NAD noted during Triage. A&Ox4. GCS 15. BIB Wheelchair.

## 2022-12-03 NOTE — Discharge Instructions (Signed)
As we discussed, your your symptoms are due to pancreatitis.  I did offer admission to the hospital for this but you would prefer to manage your pain at home which is reasonable.  I have given you a prescription for Percocet.  Take as prescribed as needed for severe pain only.  Do not drive or operate heavy machinery with taking this medication since it can be sedating.  I also given you Zofran which is a nausea medication for you to take as prescribed as needed.  Please follow the eating plan attached to this paperwork to help minimize your pain.  Follow-up with your primary care doctor at your earliest convenience.  It also is very important that you stop drinking alcohol as this is the likely trigger of your pain.  Return if development of any new or worsening symptoms.

## 2022-12-03 NOTE — ED Provider Notes (Signed)
[facility] AT [facility] Provider Note   CSN: [id number] Arrival date & time: 12/03/22  1202     History  Chief Complaint  Patient presents with   Abdominal Pain    Samuel Abbott is a 62 y.o. male.  Patient with history of alcoholic pancreatitis and hyperlipidemia presents today with complaints of abdominal pain. He states that same is generalized in nature throughout his abdomen and does not radiate.  Pain feels similar to when he has had pancreatitis in the past.  He states that he still drinks several beers a day.  His pain started on Monday and has been persistent since then.  Endorses nausea without vomiting or diarrhea.  Denies fevers or chills.  The history is provided by the patient. No language interpreter was used.  Abdominal Pain Associated symptoms: nausea        Home Medications Prior to Admission medications   Medication Sig Start Date End Date Taking? Authorizing Provider  atorvastatin (LIPITOR) 40 MG tablet Take 1 tablet (40 mg total) by mouth daily. 01/25/21   Fayette Pho, MD  DULoxetine (CYMBALTA) 20 MG capsule Take 20 mg by mouth 2 (two) times daily. 01/13/21   [provider]  hydrOXYzine (VISTARIL) 25 MG capsule Take 25 mg by mouth at bedtime. 11/29/20   [provider]  losartan (COZAAR) 25 MG tablet Take 25 mg by mouth daily. 09/16/20   [provider]  metFORMIN (GLUCOPHAGE) 500 MG tablet Take 1 tablet (500 mg total) by mouth daily with breakfast. 01/24/21 02/23/21  Jovita Kussmaul, MD  Multiple Vitamin (MULTIVITAMIN) capsule Take 1 capsule by mouth daily.    [provider]  nicotine (NICODERM CQ - DOSED IN MG/24 HOURS) 21 mg/24hr patch Place 1 patch (21 mg total) onto the skin daily. 01/25/21   Fayette Pho, MD  tiZANidine (ZANAFLEX) 4 MG tablet Take 4 mg by mouth every 8 (eight) hours as needed for muscle spasms. 11/29/20   [provider]      Allergies    Amoxicillin and  Penicillins    Review of Systems   Review of Systems  Gastrointestinal:  Positive for abdominal pain and nausea.  All other systems reviewed and are negative.   Physical Exam Updated Vital Signs BP (!) 137/95 (BP Location: Left Arm)   Pulse 87   Temp 97.8 F (36.6 C) (Oral)   Resp 18   Ht 6' (1.829 m)   Wt 79.4 kg   SpO2 100%   BMI 23.73 kg/m  Physical Exam Vitals and nursing note reviewed.  Constitutional:      General: He is not in acute distress.    Appearance: Normal appearance. He is normal weight. He is not ill-appearing, toxic-appearing or diaphoretic.  HENT:     Head: Normocephalic and atraumatic.  Cardiovascular:     Rate and Rhythm: Normal rate.  Pulmonary:     Effort: Pulmonary effort is normal. No respiratory distress.  Abdominal:     General: Abdomen is flat.     Palpations: Abdomen is soft.     Tenderness: There is generalized abdominal tenderness.  Musculoskeletal:        General: Normal range of motion.     Cervical back: Normal range of motion.  Skin:    General: Skin is warm and dry.  Neurological:     General: No focal deficit present.     Mental Status: He is alert.  Psychiatric:        Mood  and Affect: Mood normal.        Behavior: Behavior normal.     ED Results / Procedures / Treatments   Labs (all labs ordered are listed, but only abnormal results are displayed) Labs Reviewed  LIPASE, BLOOD - Abnormal; Notable for the following components:      Result Value   Lipase 1,309 (*)    All other components within normal limits  COMPREHENSIVE METABOLIC PANEL - Abnormal; Notable for the following components:   Sodium 134 (*)    Chloride 96 (*)    Glucose, Bld 166 (*)    Total Protein 8.2 (*)    AST 13 (*)    Total Bilirubin 1.4 (*)    All other components within normal limits  CBC - Abnormal; Notable for the following components:   WBC 11.4 (*)    All other components within normal limits  URINALYSIS, ROUTINE W REFLEX MICROSCOPIC     EKG None  Radiology CT ABDOMEN PELVIS W CONTRAST  Result Date: 12/03/2022 CLINICAL DATA:  Pancreatitis, acute, severe. EXAM: CT ABDOMEN AND PELVIS WITH CONTRAST TECHNIQUE: Multidetector CT imaging of the abdomen and pelvis was performed using the standard protocol following bolus administration of intravenous contrast. RADIATION DOSE REDUCTION: This exam was performed according to the departmental dose-optimization program which includes automated exposure control, adjustment of the mA and/or kV according to patient size and/or use of iterative reconstruction technique. CONTRAST:  85mL OMNIPAQUE IOHEXOL 300 MG/ML  SOLN COMPARISON:  CT examination September 08, 2018 FINDINGS: Lower chest: No acute abnormality. Hepatobiliary: No focal liver abnormality is seen. No gallstones, gallbladder wall thickening, or biliary dilatation. 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 There is mild pancreatic ductal dilatation. There is edema and inflammatory changes about the pancreatic cyst suggesting sequela of recent pancreatitis. Spleen: Normal in size without focal abnormality. Adrenals/Urinary Tract: Adrenal glands are unremarkable. Kidneys are normal, without renal calculi, focal lesion, or hydronephrosis. Bladder is unremarkable. Stomach/Bowel: Stomach is within normal limits. Appendix appears normal. No evidence of bowel wall thickening, distention, or inflammatory changes. Vascular/Lymphatic: No significant vascular findings are present. No enlarged abdominal or pelvic lymph nodes. Reproductive: Prostate is unremarkable. Other: No abdominal wall hernia or abnormality. No abdominopelvic ascites. 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 No acute osseous abnormality. IMPRESSION: 1. 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 There is edema and  inflammatory changes about the pancreatic cyst suggesting sequela of recent pancreatitis. 2. 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 No acute osseous abnormality. 3. Normal appendix. No evidence of colitis or diverticulitis. 4. No CT evidence of acute abdominal/pelvic process. 5. Follow-up CT scan of the abdomen with contrast in 3 months is recommended to ensure resolution of the pancreatic cyst and exclude underlying neoplasm. For typical adult patients who do not have specific clinical risk factors for pancreatic malignancy or symptoms relating to pancreatitis: Electronically Signed   By: Larose Hires D.O.   On: 12/03/2022 15:56    Procedures Procedures    Medications Ordered in ED Medications  iohexol (OMNIPAQUE) 300 MG/ML solution 100 mL (has no administration in time range)  HYDROmorphone (DILAUDID) injection 1 mg (1 mg Intravenous Given 12/03/22 1455)  lactated ringers bolus 1,000 mL (1,000 mLs Intravenous New Bag/Given 12/03/22 1455)  ondansetron (ZOFRAN) 4 MG/2ML injection (4 mg  Given 12/03/22 1455)  ED Course/ Medical Decision Making/ A&P                             Medical Decision Making Amount and/or Complexity of Data Reviewed Labs: ordered. Radiology: ordered.  Risk Prescription drug management.   This patient is a 61 y.o. male who presents to the ED for concern of abdominal pain, this involves an extensive number of treatment options, and is a complaint that carries with it a high risk of complications and morbidity. The emergent differential diagnosis prior to evaluation includes, but is not limited to, AAA, gastroenteritis, appendicitis, Bowel obstruction, Bowel perforation. Gastroparesis, DKA, Hernia, Inflammatory bowel disease, mesenteric ischemia, pancreatitis, peritonitis SBP, volvulus.  This is not an exhaustive differential.   Past Medical History / Co-morbidities / Social History: history of alcoholic pancreatitis and  hyperlipidemia   Physical Exam: Physical exam performed. The pertinent findings include: generalized abdominal TTP  Lab Tests: I ordered, and personally interpreted labs.  The pertinent results include:  WBC 11.4, T bili 1.4, lipase 1,309    Imaging Studies: I ordered imaging studies including CT abdomen pelvis. I independently visualized and interpreted imaging which showed   1. 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 There is edema and inflammatory changes about the pancreatic cyst suggesting sequela of recent pancreatitis. 2. 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 No acute osseous abnormality. 3. Normal appendix. No evidence of colitis or diverticulitis. 4. No CT evidence of acute abdominal/pelvic process. 5. Follow-up CT scan of the abdomen with contrast in 3 months is recommended to ensure resolution of the pancreatic cyst and exclude underlying neoplasm. For typical adult patients who do not have specific clinical risk factors for pancreatic malignancy or symptoms relating to pancreatitis:  I agree with the radiologist interpretation.   Medications: I ordered medication including dilaudid, lr, zofran  for pain, nausea, dehydration. Reevaluation of the patient after these medicines showed that the patient improved. I have reviewed the patients home medicines and have made adjustments as needed.   Disposition: After consideration of the diagnostic results and the patients response to treatment, I feel that emergency department workup does not suggest an emergent condition requiring admission or immediate intervention beyond what has been performed at this time. The plan is: discharge with close outpatient follow-up. Patient with pancreatitis, symptoms consistent with same. Offered admission which patient declined. States he would prefer to manage his pain at home.  Will  send for Percocet and Zofran for relief and give attachment for pancreatitis eating plan.  PDMP reviewed, patient advised not to drive or operate heavy machinery while taking this medication.  Evaluation and diagnostic testing in the emergency department does not suggest an emergent condition requiring admission or immediate intervention beyond what has been performed at this time.  Plan for discharge with close PCP follow-up.  Patient is understanding and amenable with plan, educated on red flag symptoms that would prompt immediate return.  Patient discharged in stable condition.  Final Clinical Impression(s) / ED Diagnoses Final diagnoses:  Alcohol-induced acute pancreatitis without infection or necrosis    Rx / DC Orders ED Discharge Orders          Ordered    oxyCODONE-acetaminophen (PERCOCET/ROXICET) 5-325 MG tablet  Every 8 hours PRN        12/03/22 1730    ondansetron (ZOFRAN-ODT) 4 MG disintegrating tablet  Every  8 hours PRN        12/03/22 1730          An After Visit Summary was printed and given to the patient.     Vear Clock 12/03/22 1732    Tegeler, Canary Brim, MD 12/08/22 [phone]

## 2022-12-04 ENCOUNTER — Other Ambulatory Visit (HOSPITAL_BASED_OUTPATIENT_CLINIC_OR_DEPARTMENT_OTHER): Payer: Self-pay

## 2023-03-17 ENCOUNTER — Other Ambulatory Visit: Payer: Self-pay

## 2023-03-17 ENCOUNTER — Emergency Department (HOSPITAL_BASED_OUTPATIENT_CLINIC_OR_DEPARTMENT_OTHER): Payer: 59 | Admitting: Radiology

## 2023-03-17 ENCOUNTER — Emergency Department (HOSPITAL_BASED_OUTPATIENT_CLINIC_OR_DEPARTMENT_OTHER)
Admission: EM | Admit: 2023-03-17 | Discharge: 2023-03-17 | Disposition: A | Discharge status: Home / Self Care | Payer: 59 | Attending: Emergency Medicine | Admitting: Emergency Medicine

## 2023-03-17 ENCOUNTER — Emergency Department (HOSPITAL_BASED_OUTPATIENT_CLINIC_OR_DEPARTMENT_OTHER): Payer: 59

## 2023-03-17 DIAGNOSIS — K863 Pseudocyst of pancreas: Secondary | ICD-10-CM | POA: Diagnosis not present

## 2023-03-17 DIAGNOSIS — R0789 Other chest pain: Secondary | ICD-10-CM | POA: Diagnosis present

## 2023-03-17 DIAGNOSIS — J90 Pleural effusion, not elsewhere classified: Secondary | ICD-10-CM | POA: Insufficient documentation

## 2023-03-17 DIAGNOSIS — R030 Elevated blood-pressure reading, without diagnosis of hypertension: Secondary | ICD-10-CM

## 2023-03-17 DIAGNOSIS — R072 Precordial pain: Secondary | ICD-10-CM

## 2023-03-17 LAB — BASIC METABOLIC PANEL
Anion gap: 12 (ref 5–15)
BUN: 14 mg/dL (ref 6–20)
CO2: 25 mmol/L (ref 22–32)
Calcium: 9.1 mg/dL (ref 8.9–10.3)
Chloride: 100 mmol/L (ref 98–111)
Creatinine, Ser: 0.58 mg/dL — ABNORMAL LOW (ref 0.61–1.24)
GFR, Estimated: >60 mL/min (ref >60)
Glucose, Bld: 181 mg/dL — ABNORMAL HIGH (ref 70–99)
Potassium: 3.7 mmol/L (ref 3.5–5.1)
Sodium: 137 mmol/L (ref 135–145)

## 2023-03-17 LAB — D-DIMER, QUANTITATIVE: D-Dimer, Quant: 3.19 ug{FEU}/mL — ABNORMAL HIGH (ref 0.00–0.50)

## 2023-03-17 LAB — TROPONIN I (HIGH SENSITIVITY): Troponin I (High Sensitivity): <2 ng/L (ref <18)

## 2023-03-17 LAB — CBC
HCT: 36.3 % — ABNORMAL LOW (ref 39.0–52.0)
Hemoglobin: 12.3 g/dL — ABNORMAL LOW (ref 13.0–17.0)
MCH: 31.4 pg (ref 26.0–34.0)
MCHC: 33.9 g/dL (ref 30.0–36.0)
MCV: 92.6 fL (ref 80.0–100.0)
Platelets: 341 10*3/uL (ref 150–400)
RBC: 3.92 MIL/uL — ABNORMAL LOW (ref 4.22–5.81)
RDW: 12.5 % (ref 11.5–15.5)
WBC: 11.1 10*3/uL — ABNORMAL HIGH (ref 4.0–10.5)
nRBC: 0 % (ref 0.0–0.2)

## 2023-03-17 MED ORDER — IOHEXOL 350 MG/ML SOLN
100.0000 mL | Freq: Once | INTRAVENOUS | Status: AC | PRN
  Administered 2023-03-17: 75 mL via INTRAVENOUS

## 2023-03-17 MED ORDER — TRAMADOL HCL 50 MG PO TABS: 50.0000 mg | ORAL_TABLET | Freq: Four times a day (QID) | ORAL | 0 refills | Status: DC | PRN

## 2023-03-17 MED ORDER — HYDROMORPHONE HCL 1 MG/ML IJ SOLN
0.5000 mg | Freq: Once | INTRAMUSCULAR | Status: AC
  Administered 2023-03-17: 0.5 mg via INTRAVENOUS
  Filled 2023-03-17: qty 1

## 2023-03-17 MED ORDER — DOXYCYCLINE HYCLATE 100 MG PO CAPS: 100.0000 mg | ORAL_CAPSULE | Freq: Two times a day (BID) | ORAL | 0 refills | Status: DC

## 2023-03-17 MED ORDER — IBUPROFEN 400 MG PO TABS
400.0000 mg | ORAL_TABLET | Freq: Once | ORAL | Status: AC
  Administered 2023-03-17: 400 mg via ORAL
  Filled 2023-03-17: qty 1

## 2023-03-17 NOTE — Discharge Instructions (Addendum)
It was our pleasure to provide your ER care today - we hope that you feel better. Take antibiotic as prescribed. Take acetaminophen or ibuprofen as need. You may also take ultram as need for pain - no driving for the next 6 hours or when taking ultram.   Overall, your heart tests look good/normal.  The imaging tests were read as showing: No evidence of pulmonary embolus. Small left pleural effusion with left base atelectasis. Fluid and stranding noted around the pancreatic tail. 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   Follow up closely with primary care doctor in the coming week - discuss above imaging findings with them, and discuss plan for follow up and follow up imaging.  Also follow up with primary care doctor regarding your blood pressure that is high today.   For pancreatic cyst/pseudocyst, follow up with GI specialist in the next 1-2 weeks - call office to arrange appointment.   For pleural effusion/pain, follow up with pulmonary specialist in the next 1-2 weeks - call office for appointment.   Return to ER if worse, new symptoms, fevers, recurrent or persistent chest pain, increased trouble breathing, new or severe abdominal pain, persistent vomiting, or other concern.   You were given pain meds in the ER - no driving for the next 6 hours.

## 2023-03-17 NOTE — ED Notes (Signed)
Pt taken back to CT.

## 2023-03-17 NOTE — ED Triage Notes (Signed)
Chest pain x several days. Worse at night. Left sided, sharp, into neck. Fatigued, SOB with exertion.   HX hyperlipidemia, pancreatitis.

## 2023-03-17 NOTE — ED Provider Notes (Signed)
[facility]MENT AT [facility] Provider Note   CSN: [id number] Arrival date & time: 03/17/23  1508     History  Chief Complaint  Patient presents with   Chest Pain    Samuel Abbott is a 62 y.o. male.  Pt c/o mid to left chest pain at rest in past week. Sharp, non radiating, worse w certain movements and positional changes or torso/shoulders. Denies trauma, fall or injury. No associated sob, nv or diaphoresis. ? mildly pleuritic pain. No hx cad or fam hx premature cad. No leg pain or swelling. No recent immobility, trauma, major surgery, or hx dvt or pe. No heartburn. Occasional non prod cough, and symptoms inc by cough. No other uri symptoms. No fever or chills. Denies exertional chest pain or discomfort. No unusual doe or sob.   The history is provided by the patient and medical records.  Chest Pain Associated symptoms: no abdominal pain, no back pain, no cough, no fever, no headache, no palpitations, no shortness of breath and no vomiting        Home Medications Prior to Admission medications   Medication Sig Start Date End Date Taking? Authorizing Provider  atorvastatin (LIPITOR) 40 MG tablet Take 1 tablet (40 mg total) by mouth daily. 01/25/21   Valetta Close, MD  DULoxetine (CYMBALTA) 20 MG capsule Take 20 mg by mouth 2 (two) times daily. 01/13/21   [provider]  hydrOXYzine (VISTARIL) 25 MG capsule Take 25 mg by mouth at bedtime. 11/29/20   [provider]  losartan (COZAAR) 25 MG tablet Take 25 mg by mouth daily. 09/16/20   [provider]  metFORMIN (GLUCOPHAGE) 500 MG tablet Take 1 tablet (500 mg total) by mouth daily with breakfast. 01/24/21 02/23/21  Jovita Kussmaul, MD  Multiple Vitamin (MULTIVITAMIN) capsule Take 1 capsule by mouth daily.    [provider]  nicotine (NICODERM CQ - DOSED IN MG/24 HOURS) 21 mg/24hr patch Place 1 patch (21 mg total) onto the skin daily. 01/25/21   Valetta Close, MD   ondansetron (ZOFRAN-ODT) 4 MG disintegrating tablet Take 1 tablet (4 mg total) by mouth every 8 (eight) hours as needed for nausea or vomiting. 12/03/22   Smoot, Shawn Route, PA-C  oxyCODONE-acetaminophen (PERCOCET/ROXICET) 5-325 MG tablet Take 1 tablet by mouth every 8 (eight) hours as needed for severe pain. 12/03/22   Smoot, Shawn Route, PA-C  tiZANidine (ZANAFLEX) 4 MG tablet Take 4 mg by mouth every 8 (eight) hours as needed for muscle spasms. 11/29/20   [provider]      Allergies    Amoxicillin and Penicillins    Review of Systems   Review of Systems  Constitutional:  Negative for chills and fever.  HENT:  Negative for sore throat.   Eyes:  Negative for redness.  Respiratory:  Negative for cough and shortness of breath.   Cardiovascular:  Positive for chest pain. Negative for palpitations and leg swelling.  Gastrointestinal:  Negative for abdominal pain and vomiting.  Genitourinary:  Negative for flank pain.  Musculoskeletal:  Negative for back pain and neck pain.  Skin:  Negative for rash.  Neurological:  Negative for headaches.  Hematological:  Does not bruise/bleed easily.  Psychiatric/Behavioral:  Negative for confusion.     Physical Exam Updated Vital Signs BP (!) 137/103   Pulse 81   Temp 98.2 F (36.8 C) (Oral)   Resp 18   Ht 1.829 m (6')   Wt 73 kg   SpO2  100%   BMI 21.84 kg/m  Physical Exam Vitals and nursing note reviewed.  Constitutional:      Appearance: Normal appearance. He is well-developed.  HENT:     Head: Atraumatic.     Nose: Nose normal.     Mouth/Throat:     Mouth: Mucous membranes are moist.     Pharynx: Oropharynx is clear.  Eyes:     General: No scleral icterus.    Conjunctiva/sclera: Conjunctivae normal.  Neck:     Trachea: No tracheal deviation.  Cardiovascular:     Rate and Rhythm: Normal rate and regular rhythm.     Pulses: Normal pulses.     Heart sounds: Normal heart sounds. No murmur heard.    No friction rub. No gallop.   Pulmonary:     Effort: Pulmonary effort is normal. No accessory muscle usage or respiratory distress.     Breath sounds: Normal breath sounds.     Comments: Chest wall pain/tenderness reproduced w palpation and movements or torso and shoulders. No sts or skin changes. No crepitus.  Chest:     Chest wall: Tenderness present.  Abdominal:     General: Bowel sounds are normal. There is no distension.     Palpations: Abdomen is soft.     Tenderness: There is no abdominal tenderness.  Musculoskeletal:        General: No swelling or tenderness.     Cervical back: Normal range of motion and neck supple. No rigidity.     Right lower leg: No edema.     Left lower leg: No edema.  Skin:    General: Skin is warm and dry.     Findings: No rash.  Neurological:     Mental Status: He is alert.     Comments: Alert, speech clear.   Psychiatric:        Mood and Affect: Mood normal.     ED Results / Procedures / Treatments   Labs (all labs ordered are listed, but only abnormal results are displayed) Results for orders placed or performed during the hospital encounter of 03/17/23  Basic metabolic panel  Result Value Ref Range   Sodium 137 135 - 145 mmol/L   Potassium 3.7 3.5 - 5.1 mmol/L   Chloride 100 98 - 111 mmol/L   CO2 25 22 - 32 mmol/L   Glucose, Bld 181 (H) 70 - 99 mg/dL   BUN 14 6 - 20 mg/dL   Creatinine, Ser 5.62 (L) 0.61 - 1.24 mg/dL   Calcium 9.1 8.9 - 13.0 mg/dL   GFR, Estimated >86 >57 mL/min   Anion gap 12 5 - 15  CBC  Result Value Ref Range   WBC 11.1 (H) 4.0 - 10.5 K/uL   RBC 3.92 (L) 4.22 - 5.81 MIL/uL   Hemoglobin 12.3 (L) 13.0 - 17.0 g/dL   HCT 84.6 (L) 96.2 - 95.2 %   MCV 92.6 80.0 - 100.0 fL   MCH 31.4 26.0 - 34.0 pg   MCHC 33.9 30.0 - 36.0 g/dL   RDW 84.1 32.4 - 40.1 %   Platelets 341 150 - 400 K/uL   nRBC 0.0 0.0 - 0.2 %  D-dimer, quantitative  Result Value Ref Range   D-Dimer, Quant 3.19 (H) 0.00 - 0.50 ug/mL-FEU  Troponin I (High Sensitivity)  Result  Value Ref Range   Troponin I (High Sensitivity) <2 <18 ng/L   CT Angio Chest PE W/Cm &/Or Wo Cm  Result Date: 03/17/2023 CLINICAL DATA:  Pulmonary  embolism (PE) suspected, low to intermediate prob, positive D-dimer. Left side chest pain. Shortness of breath. EXAM: CT ANGIOGRAPHY CHEST WITH CONTRAST TECHNIQUE: Multidetector CT imaging of the chest was performed using the standard protocol during bolus administration of intravenous contrast. Multiplanar CT image reconstructions and MIPs were obtained to evaluate the vascular anatomy. RADIATION DOSE REDUCTION: This exam was performed according to the departmental dose-optimization program which includes automated exposure control, adjustment of the mA and/or kV according to patient size and/or use of iterative reconstruction technique. CONTRAST:  75mL OMNIPAQUE IOHEXOL 350 MG/ML SOLN COMPARISON:  01/22/2021 FINDINGS: Cardiovascular: No filling defects in the pulmonary arteries to suggest pulmonary emboli. Heart is normal size. Aorta is normal caliber. Mediastinum/Nodes: No mediastinal, hilar, or axillary adenopathy. Trachea and esophagus are unremarkable. Thyroid unremarkable. Lungs/Pleura: Biapical scarring. Small left pleural effusion. Left base atelectasis. No confluent opacity or effusion on the right. Upper Abdomen: Calcifications within the pancreas compatible chronic pancreatitis. Previously seen cyst within the pancreatic head/uncinate process not definitively seen on today's study. There is fluid adjacent to the pancreatic tail. Stranding adjacent to the pancreatic tail and splenic hilum noted. This extends anteriorly where there is a fluid collection along the anterior left hemidiaphragm measuring 6.2 x 3.8 cm. This is new since prior study and presumably reflects pseudocyst related to pancreatitis. Musculoskeletal: No acute findings. Review of the MIP images confirms the above findings. IMPRESSION: No evidence of pulmonary embolus. Small left pleural  effusion with left base atelectasis. Fluid and stranding noted around the pancreatic tail. 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 Electronically Signed   By: Charlett Nose M.D.   On: 03/17/2023 20:41   DG Chest 2 View  Result Date: 03/17/2023 CLINICAL DATA:  Chest pain for 8 days, sharp in nature. Pain radiates into the back. Fatigue and shortness of breath. EXAM: CHEST - 2 VIEW COMPARISON:  CT chest 01/22/2021 FINDINGS: Cardiac silhouette and mediastinal contours are within limits. There is flattening of the diaphragms and chronic hyperinflation. Increased mild left basilar horizontal linear subsegmental atelectasis versus scarring. Possible tiny left pleural effusion. The right lung is clear. No pneumothorax. Minimal dextrocurvature of the mid to lower thoracic spine. IMPRESSION: Increased mild left basilar horizontal linear subsegmental atelectasis versus scarring. Possible tiny left pleural effusion. Electronically Signed   By: Neita Garnet M.D.   On: 03/17/2023 16:47     EKG EKG Interpretation Date/Time:  Wednesday March 17 2023 15:13:37 EDT Ventricular Rate:  88 PR Interval:  168 QRS Duration:  142 QT Interval:  402 QTC Calculation: 486 R Axis:   63  Text Interpretation: Normal sinus rhythm Right bundle branch block Abnormal ECG When compared with ECG of 23-Jan-2021 08:58, No significant change was found Confirmed by Edwin Dada ([id number]) on 03/17/2023 3:45:04 PM  Radiology CT Angio Chest PE W/Cm &/Or Wo Cm  Result Date: 03/17/2023 CLINICAL DATA:  Pulmonary embolism (PE) suspected, low to intermediate prob, positive D-dimer. Left side chest pain. Shortness of breath. EXAM: CT ANGIOGRAPHY CHEST WITH CONTRAST TECHNIQUE: Multidetector CT imaging of the chest was performed using the standard protocol during bolus administration of intravenous contrast. Multiplanar CT image reconstructions and MIPs were obtained to  evaluate the vascular anatomy. RADIATION DOSE REDUCTION: This exam was performed according to the departmental dose-optimization program which includes automated exposure control, adjustment of the mA and/or kV according to patient size and/or use of iterative reconstruction technique. CONTRAST:  75mL OMNIPAQUE IOHEXOL 350 MG/ML SOLN  COMPARISON:  01/22/2021 FINDINGS: Cardiovascular: No filling defects in the pulmonary arteries to suggest pulmonary emboli. Heart is normal size. Aorta is normal caliber. Mediastinum/Nodes: No mediastinal, hilar, or axillary adenopathy. Trachea and esophagus are unremarkable. Thyroid unremarkable. Lungs/Pleura: Biapical scarring. Small left pleural effusion. Left base atelectasis. No confluent opacity or effusion on the right. Upper Abdomen: Calcifications within the pancreas compatible chronic pancreatitis. Previously seen cyst within the pancreatic head/uncinate process not definitively seen on today's study. There is fluid adjacent to the pancreatic tail. Stranding adjacent to the pancreatic tail and splenic hilum noted. This extends anteriorly where there is a fluid collection along the anterior left hemidiaphragm measuring 6.2 x 3.8 cm. This is new since prior study and presumably reflects pseudocyst related to pancreatitis. Musculoskeletal: No acute findings. Review of the MIP images confirms the above findings. IMPRESSION: No evidence of pulmonary embolus. Small left pleural effusion with left base atelectasis. Fluid and stranding noted around the pancreatic tail. 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 Electronically Signed   By: Charlett Nose M.D.   On: 03/17/2023 20:41   DG Chest 2 View  Result Date: 03/17/2023 CLINICAL DATA:  Chest pain for 8 days, sharp in nature. Pain radiates into the back. Fatigue and shortness of breath. EXAM: CHEST - 2 VIEW COMPARISON:  CT chest 01/22/2021 FINDINGS: Cardiac  silhouette and mediastinal contours are within limits. There is flattening of the diaphragms and chronic hyperinflation. Increased mild left basilar horizontal linear subsegmental atelectasis versus scarring. Possible tiny left pleural effusion. The right lung is clear. No pneumothorax. Minimal dextrocurvature of the mid to lower thoracic spine. IMPRESSION: Increased mild left basilar horizontal linear subsegmental atelectasis versus scarring. Possible tiny left pleural effusion. Electronically Signed   By: Neita Garnet M.D.   On: 03/17/2023 16:47    Procedures Procedures    Medications Ordered in ED Medications  ibuprofen (ADVIL) tablet 400 mg (400 mg Oral Given 03/17/23 1735)  iohexol (OMNIPAQUE) 350 MG/ML injection 100 mL (75 mLs Intravenous Contrast Given 03/17/23 1945)  HYDROmorphone (DILAUDID) injection 0.5 mg (0.5 mg Intravenous Given 03/17/23 1958)    ED Course/ Medical Decision Making/ A&P                                 Medical Decision Making Problems Addressed: Chest wall pain: acute illness or injury with systemic symptoms that poses a threat to life or bodily functions Elevated blood pressure reading: acute illness or injury Pancreatic pseudocyst: chronic illness or injury with exacerbation, progression, or side effects of treatment that poses a threat to life or bodily functions Pleural effusion on left: acute illness or injury with systemic symptoms that poses a threat to life or bodily functions Precordial chest pain: acute illness or injury with systemic symptoms that poses a threat to life or bodily functions  Amount and/or Complexity of Data Reviewed External Data Reviewed: radiology and notes. Labs: ordered. Decision-making details documented in ED Course. Radiology: ordered and independent interpretation performed. Decision-making details documented in ED Course. ECG/medicine tests: ordered and independent interpretation performed. Decision-making details documented in  ED Course.  Risk Prescription drug management. Parenteral controlled substances. Decision regarding hospitalization.   Iv ns. Continuous pulse ox and cardiac monitoring. Labs ordered/sent. Imaging ordered.   Differential diagnosis includes acs, msk cp, gi cp, etc. Dispo decision including potential need for admission considered - will get labs and  imaging and reassess.   Reviewed nursing notes and prior charts for additional history. External reports reviewed.   Cardiac monitor: sinus rhythm, rate 90.  Labs reviewed/interpreted by me - trop normal. After persistent symptoms x several days, trop normal, and reproducible cp/chest wall pain felt not c/w acs. Ddimer elev.  Xrays reviewed/interpreted by me - no pna.   CT reviewed/interpreted by me - no pe. Small left effusion - ?cause of current pain, mildly pleuritic.  Pancreatic pseudocyst. Pt denies abd pain or nv. No abd tenderness. Rec pulm and gi f/u. Pt requests rx for pain - provided.   Rec close pcp f/u, pulm and gi f/u.   Return precautions provided.          Final Clinical Impression(s) / ED Diagnoses Final diagnoses:  Precordial chest pain  Chest wall pain  Pleural effusion on left  Pancreatic pseudocyst    Rx / DC Orders ED Discharge Orders     None         Cathren Laine, MD 03/17/23 2110

## 2023-03-17 NOTE — ED Notes (Signed)
Pt states that he wanted to walk outside to give his friend a key. Explained that a staff member would have to escort him since he had an IV. This RN walked with pt, who then stated that he needed to go to his car and then proceeded to smoke a cigarette. Pt admits that there was no friend that needed a key. Pt escorted back to room.

## 2023-03-17 NOTE — ED Notes (Signed)
Pt currently in CT.

## 2023-03-23 ENCOUNTER — Encounter: Payer: Self-pay | Admitting: Pulmonary Disease

## 2023-03-23 ENCOUNTER — Ambulatory Visit: Payer: 59 | Admitting: Pulmonary Disease

## 2023-03-23 VITALS — BP 124/62 | HR 87 | Temp 97.0°F | Ht 72.0 in | Wt 167.0 lb

## 2023-03-23 DIAGNOSIS — F17210 Nicotine dependence, cigarettes, uncomplicated: Secondary | ICD-10-CM

## 2023-03-23 DIAGNOSIS — Z720 Tobacco use: Secondary | ICD-10-CM

## 2023-03-23 DIAGNOSIS — F1010 Alcohol abuse, uncomplicated: Secondary | ICD-10-CM

## 2023-03-23 NOTE — Progress Notes (Signed)
Samuel Abbott    [id number]    1961/03/23  Primary Care Physician:Vanstory, Suzan Slick, PA  Referring Physician: Jackie Plum, MD [id number] [place],  [place] [address]  Chief complaint: Consult for dyspnea, pleural effusion  HPI: 62 y.o. who  has a past medical history of Acute pancreatitis, Alcohol abuse, Eczema, High cholesterol, Hypertension, and Tobacco abuse.   He has history of alcohol abuse, chronic pancreatitis, pseudocyst formation.  Evaluated in the ED on 03/17/2023 with atypical chest pain.  He had a CT done with no evidence of pulmonary embolism.  He had a small left effusion and a pancreatic pseudocyst and has been referred here for further evaluation  Reports drinking 4-5 alcoholic drinks per day and review of his chart shows multiple admissions and ED visits dating back to April 2020 which showed chronic pancreatitis.  Pets: No pets Occupation: Corporate investment banker Exposures: No mold, hot tub, Jacuzzi.  No feather pillows or comforters Smoking history: 65-pack-year smoker Travel history: No significant travel history Relevant family history: No family history of lung disease   Outpatient Encounter Medications as of 03/23/2023  Medication Sig   atorvastatin (LIPITOR) 40 MG tablet Take 1 tablet (40 mg total) by mouth daily.   doxycycline (VIBRAMYCIN) 100 MG capsule Take 1 capsule (100 mg total) by mouth 2 (two) times daily.   DULoxetine (CYMBALTA) 20 MG capsule Take 20 mg by mouth 2 (two) times daily.   hydrOXYzine (VISTARIL) 25 MG capsule Take 25 mg by mouth at bedtime.   losartan (COZAAR) 25 MG tablet Take 25 mg by mouth daily.   Multiple Vitamin (MULTIVITAMIN) capsule Take 1 capsule by mouth daily.   nicotine (NICODERM CQ - DOSED IN MG/24 HOURS) 21 mg/24hr patch Place 1 patch (21 mg total) onto the skin daily.   ondansetron (ZOFRAN-ODT) 4 MG disintegrating tablet Take 1 tablet (4 mg total) by mouth every 8 (eight) hours as needed for  nausea or vomiting.   tiZANidine (ZANAFLEX) 4 MG tablet Take 4 mg by mouth every 8 (eight) hours as needed for muscle spasms.   traMADol (ULTRAM) 50 MG tablet Take 1 tablet (50 mg total) by mouth every 6 (six) hours as needed.   metFORMIN (GLUCOPHAGE) 500 MG tablet Take 1 tablet (500 mg total) by mouth daily with breakfast.   oxyCODONE-acetaminophen (PERCOCET/ROXICET) 5-325 MG tablet Take 1 tablet by mouth every 8 (eight) hours as needed for severe pain. (Patient not taking: Reported on 03/23/2023)   No facility-administered encounter medications on file as of 03/23/2023.    Allergies as of 03/23/2023 - Reviewed 03/17/2023  Allergen Reaction Noted   Amoxicillin Anaphylaxis 01/22/2021   Penicillins Anaphylaxis 09/08/2018    Past Medical History:  Diagnosis Date   Acute pancreatitis    Alcohol abuse    Eczema    High cholesterol    Hypertension    Tobacco abuse     Past Surgical History:  Procedure Laterality Date   WRIST SURGERY      Family History  Problem Relation Age of Onset   CVA Father     Social History   Socioeconomic History   Marital status: Single    Spouse name: Not on file   Number of children: Not on file   Years of education: Not on file   Highest education level: Not on file  Occupational History   Not on file  Tobacco Use   Smoking status: Every Day  Current packs/day: 1.50    Average packs/day: 1.5 packs/day for 40.0 years (60.0 ttl pk-yrs)    Types: Cigarettes   Smokeless tobacco: Current  Substance and Sexual Activity   Alcohol use: Yes    Alcohol/week: 56.0 standard drinks of alcohol    Types: 56 Shots of liquor per week    Comment: Beer Daily now   Drug use: Not Currently   Sexual activity: Not Currently  Other Topics Concern   Not on file  Social History Narrative   Not on file   Social Determinants of Health   Financial Resource Strain: Not on file  Food Insecurity: Not on file  Transportation Needs: Not on file  Physical  Activity: Not on file  Stress: Not on file  Social Connections: Not on file  Intimate Partner Violence: Not on file    Review of systems: Review of Systems  Constitutional: Negative for fever and chills.  HENT: Negative.   Eyes: Negative for blurred vision.  Respiratory: as per HPI  Cardiovascular: Negative for chest pain and palpitations.  Gastrointestinal: Negative for vomiting, diarrhea, blood per rectum. Genitourinary: Negative for dysuria, urgency, frequency and hematuria.  Musculoskeletal: Negative for myalgias, back pain and joint pain.  Skin: Negative for itching and rash.  Neurological: Negative for dizziness, tremors, focal weakness, seizures and loss of consciousness.  Endo/Heme/Allergies: Negative for environmental allergies.  Psychiatric/Behavioral: Negative for depression, suicidal ideas and hallucinations.  All other systems reviewed and are negative.  Physical Exam: Blood pressure 124/62, pulse 87, temperature (!) 97 F (36.1 C), temperature source Temporal, height 6' (1.829 m), weight 167 lb (75.8 kg), SpO2 96%. Gen:      No acute distress HEENT:  EOMI, sclera anicteric Neck:     No masses; no thyromegaly Lungs:    Clear to auscultation bilaterally; normal respiratory effort CV:         Regular rate and rhythm; no murmurs Abd:      + bowel sounds; soft, non-tender; no palpable masses, no distension Ext:    No edema; adequate peripheral perfusion Skin:      Warm and dry; no rash Neuro: alert and oriented x 3 Psych: normal mood and affect  Data Reviewed: Imaging: CTA 03/17/2023- No evidence of pulmonary embolism, small left effusion, left base atelectasis.  Calcification within the pancreas compatible with chronic pancreatitis.  Pancreatic pseudocyst cysts measuring up to 6.2 cm.  I have reviewed the images personally.  PFTs:  Labs:  Assessment:  Evaluation for dyspnea, pleural effusion Review of the scan and his history is that he has longstanding chronic  pancreatitis due to alcohol abuse with pancreatic inflammation with pancreatic pseudocyst formation.  I suspect that the small left effusion is due to his intra-abdominal issues with pancreatitis.  Do not recommend thoracentesis at this point  We had a long discussion today in office about his EtOH and smoking use.  He does not want to consider quitting either and does not want any other treatment or investigation for COPD. Time spent counseling-5 minutes. Return to clinic as needed.  Plan/Recommendations: Recommend smoking and alcohol cessation Return to clinic as per patient preference.  Chilton Greathouse MD [facility] 03/23/2023, 3:15 PM  CC: Jackie Plum, MD

## 2023-04-07 ENCOUNTER — Inpatient Hospital Stay (HOSPITAL_BASED_OUTPATIENT_CLINIC_OR_DEPARTMENT_OTHER)
Admission: EM | Admit: 2023-04-07 | Discharge: 2023-04-09 | DRG: 815 | Disposition: A | Discharge status: Home / Self Care | Payer: 59 | Attending: Internal Medicine | Admitting: Internal Medicine

## 2023-04-07 ENCOUNTER — Emergency Department (HOSPITAL_BASED_OUTPATIENT_CLINIC_OR_DEPARTMENT_OTHER): Payer: 59

## 2023-04-07 ENCOUNTER — Other Ambulatory Visit: Payer: Self-pay

## 2023-04-07 DIAGNOSIS — M25512 Pain in left shoulder: Secondary | ICD-10-CM | POA: Diagnosis present

## 2023-04-07 DIAGNOSIS — N179 Acute kidney failure, unspecified: Secondary | ICD-10-CM | POA: Diagnosis present

## 2023-04-07 DIAGNOSIS — K863 Pseudocyst of pancreas: Secondary | ICD-10-CM | POA: Diagnosis present

## 2023-04-07 DIAGNOSIS — D72829 Elevated white blood cell count, unspecified: Secondary | ICD-10-CM | POA: Diagnosis present

## 2023-04-07 DIAGNOSIS — S3609XA Other injury of spleen, initial encounter: Secondary | ICD-10-CM | POA: Diagnosis not present

## 2023-04-07 DIAGNOSIS — D735 Infarction of spleen: Secondary | ICD-10-CM | POA: Diagnosis present

## 2023-04-07 DIAGNOSIS — R079 Chest pain, unspecified: Secondary | ICD-10-CM

## 2023-04-07 DIAGNOSIS — E871 Hypo-osmolality and hyponatremia: Secondary | ICD-10-CM | POA: Diagnosis present

## 2023-04-07 DIAGNOSIS — K861 Other chronic pancreatitis: Secondary | ICD-10-CM | POA: Diagnosis not present

## 2023-04-07 DIAGNOSIS — K860 Alcohol-induced chronic pancreatitis: Secondary | ICD-10-CM | POA: Diagnosis present

## 2023-04-07 DIAGNOSIS — F17210 Nicotine dependence, cigarettes, uncomplicated: Secondary | ICD-10-CM | POA: Diagnosis present

## 2023-04-07 DIAGNOSIS — F1010 Alcohol abuse, uncomplicated: Secondary | ICD-10-CM | POA: Diagnosis present

## 2023-04-07 DIAGNOSIS — J90 Pleural effusion, not elsewhere classified: Secondary | ICD-10-CM | POA: Diagnosis present

## 2023-04-07 DIAGNOSIS — Z823 Family history of stroke: Secondary | ICD-10-CM | POA: Diagnosis not present

## 2023-04-07 DIAGNOSIS — K862 Cyst of pancreas: Secondary | ICD-10-CM | POA: Diagnosis not present

## 2023-04-07 DIAGNOSIS — I10 Essential (primary) hypertension: Secondary | ICD-10-CM | POA: Diagnosis present

## 2023-04-07 DIAGNOSIS — E7800 Pure hypercholesterolemia, unspecified: Secondary | ICD-10-CM | POA: Diagnosis present

## 2023-04-07 DIAGNOSIS — E119 Type 2 diabetes mellitus without complications: Secondary | ICD-10-CM | POA: Diagnosis present

## 2023-04-07 DIAGNOSIS — Z880 Allergy status to penicillin: Secondary | ICD-10-CM | POA: Diagnosis not present

## 2023-04-07 DIAGNOSIS — E1122 Type 2 diabetes mellitus with diabetic chronic kidney disease: Secondary | ICD-10-CM

## 2023-04-07 DIAGNOSIS — D649 Anemia, unspecified: Secondary | ICD-10-CM | POA: Diagnosis present

## 2023-04-07 DIAGNOSIS — J9811 Atelectasis: Secondary | ICD-10-CM | POA: Diagnosis present

## 2023-04-07 DIAGNOSIS — E785 Hyperlipidemia, unspecified: Secondary | ICD-10-CM | POA: Diagnosis present

## 2023-04-07 DIAGNOSIS — Z79899 Other long term (current) drug therapy: Secondary | ICD-10-CM

## 2023-04-07 DIAGNOSIS — Z720 Tobacco use: Secondary | ICD-10-CM | POA: Diagnosis present

## 2023-04-07 DIAGNOSIS — Z7984 Long term (current) use of oral hypoglycemic drugs: Secondary | ICD-10-CM | POA: Diagnosis not present

## 2023-04-07 LAB — BASIC METABOLIC PANEL
Anion gap: 14 (ref 5–15)
BUN: 23 mg/dL — ABNORMAL HIGH (ref 6–20)
CO2: 22 mmol/L (ref 22–32)
Calcium: 9.2 mg/dL (ref 8.9–10.3)
Chloride: 96 mmol/L — ABNORMAL LOW (ref 98–111)
Creatinine, Ser: 1.25 mg/dL — ABNORMAL HIGH (ref 0.61–1.24)
GFR, Estimated: >60 mL/min (ref >60)
Glucose, Bld: 131 mg/dL — ABNORMAL HIGH (ref 70–99)
Potassium: 4 mmol/L (ref 3.5–5.1)
Sodium: 132 mmol/L — ABNORMAL LOW (ref 135–145)

## 2023-04-07 LAB — HEPATIC FUNCTION PANEL
ALT: 22 U/L (ref 0–44)
AST: 23 U/L (ref 15–41)
Albumin: 3.7 g/dL (ref 3.5–5.0)
Alkaline Phosphatase: 114 U/L (ref 38–126)
Bilirubin, Direct: 0.2 mg/dL (ref 0.0–0.2)
Indirect Bilirubin: 0.9 mg/dL (ref 0.3–0.9)
Total Bilirubin: 1.1 mg/dL (ref 0.3–1.2)
Total Protein: 8.1 g/dL (ref 6.5–8.1)

## 2023-04-07 LAB — CBC
HCT: 32.6 % — ABNORMAL LOW (ref 39.0–52.0)
Hemoglobin: 11 g/dL — ABNORMAL LOW (ref 13.0–17.0)
MCH: 30.9 pg (ref 26.0–34.0)
MCHC: 33.7 g/dL (ref 30.0–36.0)
MCV: 91.6 fL (ref 80.0–100.0)
Platelets: 258 10*3/uL (ref 150–400)
RBC: 3.56 MIL/uL — ABNORMAL LOW (ref 4.22–5.81)
RDW: 13.1 % (ref 11.5–15.5)
WBC: 17.5 10*3/uL — ABNORMAL HIGH (ref 4.0–10.5)
nRBC: 0 % (ref 0.0–0.2)

## 2023-04-07 LAB — HEMOGLOBIN AND HEMATOCRIT, BLOOD
HCT: 28.4 % — ABNORMAL LOW (ref 39.0–52.0)
Hemoglobin: 9.6 g/dL — ABNORMAL LOW (ref 13.0–17.0)

## 2023-04-07 LAB — TROPONIN I (HIGH SENSITIVITY): Troponin I (High Sensitivity): 3 ng/L (ref <18)

## 2023-04-07 LAB — LIPASE, BLOOD: Lipase: 92 U/L — ABNORMAL HIGH (ref 11–51)

## 2023-04-07 LAB — GLUCOSE, CAPILLARY: Glucose-Capillary: 134 mg/dL — ABNORMAL HIGH (ref 70–99)

## 2023-04-07 MED ORDER — SODIUM CHLORIDE 0.9% FLUSH
3.0000 mL | Freq: Two times a day (BID) | INTRAVENOUS | Status: DC
  Administered 2023-04-07 – 2023-04-09 (×4): 3 mL via INTRAVENOUS

## 2023-04-07 MED ORDER — ONDANSETRON HCL 4 MG PO TABS
4.0000 mg | ORAL_TABLET | Freq: Four times a day (QID) | ORAL | Status: DC | PRN
  Administered 2023-04-08 – 2023-04-09 (×3): 4 mg via ORAL
  Filled 2023-04-07 (×4): qty 1

## 2023-04-07 MED ORDER — FENTANYL CITRATE PF 50 MCG/ML IJ SOSY
50.0000 ug | PREFILLED_SYRINGE | Freq: Once | INTRAMUSCULAR | Status: AC
  Administered 2023-04-07: 50 ug via INTRAVENOUS
  Filled 2023-04-07: qty 1

## 2023-04-07 MED ORDER — ACETAMINOPHEN 650 MG RE SUPP: 650.0000 mg | Freq: Four times a day (QID) | RECTAL | Status: DC | PRN

## 2023-04-07 MED ORDER — HYDROMORPHONE HCL 1 MG/ML IJ SOLN
0.5000 mg | Freq: Once | INTRAMUSCULAR | Status: AC
  Administered 2023-04-07: 0.5 mg via INTRAVENOUS
  Filled 2023-04-07: qty 1

## 2023-04-07 MED ORDER — IOHEXOL 350 MG/ML SOLN
100.0000 mL | Freq: Once | INTRAVENOUS | Status: AC | PRN
  Administered 2023-04-07: 100 mL via INTRAVENOUS

## 2023-04-07 MED ORDER — SENNOSIDES-DOCUSATE SODIUM 8.6-50 MG PO TABS: 1.0000 | ORAL_TABLET | Freq: Every evening | ORAL | Status: DC | PRN

## 2023-04-07 MED ORDER — HYDROXYZINE HCL 10 MG PO TABS
25.0000 mg | ORAL_TABLET | Freq: Every day | ORAL | Status: DC
  Administered 2023-04-08: 25 mg via ORAL
  Filled 2023-04-07: qty 3

## 2023-04-07 MED ORDER — ATORVASTATIN CALCIUM 40 MG PO TABS
40.0000 mg | ORAL_TABLET | Freq: Every day | ORAL | Status: DC
  Administered 2023-04-08 – 2023-04-09 (×2): 40 mg via ORAL
  Filled 2023-04-07 (×2): qty 1

## 2023-04-07 MED ORDER — HYDROMORPHONE HCL 1 MG/ML IJ SOLN
0.5000 mg | INTRAMUSCULAR | Status: AC | PRN
  Administered 2023-04-07 – 2023-04-08 (×4): 0.5 mg via INTRAVENOUS
  Filled 2023-04-07 (×4): qty 0.5

## 2023-04-07 MED ORDER — INSULIN ASPART 100 UNIT/ML IJ SOLN
0.0000 [IU] | Freq: Three times a day (TID) | INTRAMUSCULAR | Status: DC
  Administered 2023-04-08: 1 [IU] via SUBCUTANEOUS
  Administered 2023-04-08: 5 [IU] via SUBCUTANEOUS

## 2023-04-07 MED ORDER — NICOTINE 21 MG/24HR TD PT24
21.0000 mg | MEDICATED_PATCH | Freq: Every day | TRANSDERMAL | Status: DC
  Administered 2023-04-08 – 2023-04-09 (×2): 21 mg via TRANSDERMAL
  Filled 2023-04-07 (×3): qty 1

## 2023-04-07 MED ORDER — ACETAMINOPHEN 325 MG PO TABS: 650.0000 mg | ORAL_TABLET | Freq: Four times a day (QID) | ORAL | Status: DC | PRN

## 2023-04-07 MED ORDER — DULOXETINE HCL 20 MG PO CPEP
20.0000 mg | ORAL_CAPSULE | Freq: Two times a day (BID) | ORAL | Status: DC
  Administered 2023-04-08 – 2023-04-09 (×3): 20 mg via ORAL
  Filled 2023-04-07 (×5): qty 1

## 2023-04-07 MED ORDER — ONDANSETRON HCL 4 MG/2ML IJ SOLN
4.0000 mg | Freq: Four times a day (QID) | INTRAMUSCULAR | Status: DC | PRN
  Administered 2023-04-07 – 2023-04-08 (×3): 4 mg via INTRAVENOUS
  Filled 2023-04-07 (×3): qty 2

## 2023-04-07 MED ORDER — SODIUM CHLORIDE 0.9 % IV BOLUS
1000.0000 mL | Freq: Once | INTRAVENOUS | Status: AC
  Administered 2023-04-07: 1000 mL via INTRAVENOUS

## 2023-04-07 NOTE — Plan of Care (Signed)
Plan of Care Note for accepted transfer  Patient: LOTT BALADEZ    KYH:[id number]  DOA: 04/07/2023      Facility requesting transfer: [facility] Requesting Provider: Dr. [name]  Reason for transfer: Chest pain/abd pain found to have contained splenic hematoma and ac pancreatitis with large pseudocyst  Facility course:  62 yo male with HTN, HLP, alcohol abuse with known history of pancreatic pseudocysts, pleural effusion presented with chest pain and abd pain. Unclear if he had a fall or trauma.   BP 120/71 (BP Location: Left Arm)   Pulse 80   Temp 98.5 F (36.9 C) (Oral)   Resp 16   Ht 6' (1.829 m)   Wt 75.8 kg   SpO2 99%   BMI 22.65 kg/m   CBC    Component Value Date/Time   WBC 17.5 (H) 04/07/2023 1243   RBC 3.56 (L) 04/07/2023 1243   HGB 11.0 (L) 04/07/2023 1243   HCT 32.6 (L) 04/07/2023 1243   PLT 258 04/07/2023 1243   MCV 91.6 04/07/2023 1243   MCH 30.9 04/07/2023 1243   MCHC 33.7 04/07/2023 1243   RDW 13.1 04/07/2023 1243      CTA chest/ AP:  1. Negative for acute aortic dissection or aneurysm. No significant vascular disease within the abdomen or pelvis 2. Interim development of subcapsular splenic hematoma measuring up to 2.9 cm in thickness. New large perisplenic slightly dense fluid collection suggestive of contained rupture. No extravasation of contrast to suggest active bleeding on this exam. 3. Findings suspicious for mild acute on chronic distal pancreatitis. Multiple rim enhancing fluid collections in the left upper quadrant, largest contiguous with fluid extending along the gastric fundus, measuring up to 7 cm. These probably represent pseudocyst given findings of mild pancreatic inflammatory change and additional fluid collection at the tail of pancreas.  - I discussed on phone with trauma surgery on call, Dr Bedelia Person. Per Dr Bedelia Person, patient is hemodynamically stable, H/H stable close to baseline, surgery would be last resort with contained splenic rupture.  If he becomes unstable with worsening anemia, will need IR for embolization. Trauma surgery will follow patient once arrived to floor.  - I requested Dr [name] (EDP) to discuss with GI on call regarding large pseudocyst with rim enhacing fluid collections. Dr [name] spoke with GI, Dr Loreta Ave who recommended conservative management for now, no GI intervention indicated at this time and GI will follow in am.     Plan of care: The patient is accepted for admission to Progressive unit, at [facility].   Author: Thad Ranger, MD  04/07/2023  Check [email or web address] for on-call coverage.

## 2023-04-07 NOTE — ED Notes (Signed)
Patient enroute to [facility] via [facility]

## 2023-04-07 NOTE — Discharge Instructions (Signed)
Please follow-up with Dr. Elnoria Howard with gastroenterology regarding her pancreatic cyst.  Follow-up with your primary care doctor.

## 2023-04-07 NOTE — ED Notes (Signed)
Report given to floor nurse

## 2023-04-07 NOTE — ED Notes (Signed)
Report given to [facility]

## 2023-04-07 NOTE — ED Notes (Signed)
Patient transported to CT 

## 2023-04-07 NOTE — H&P (Signed)
History and Physical    GAYLORD CARRINO ZOX:[id number] DOB: 1961-06-18 DOA: 04/07/2023  PCP: Norm Salt, PA  Patient coming from: Home  I have personally briefly reviewed patient's old medical records in [facility]  Chief Complaint: Left lower chest/upper abdominal pain  HPI: Samuel Abbott is a 62 y.o. male with medical history significant for HTN, HLD, alcohol and tobacco use, history of pancreatitis and pancreatic pseudocyst who presented to the ED for evaluation of pain in his left lower chest/upper abdominal area.  Patient states that 3 weeks ago he developed left shoulder pain which radiated to his neck.  Afterwards he developed significant pain in his left upper abdomen to the left flank area.  Pain has been persistent and worsening.  He says he has been taking a lot of NSAIDs in attempt for pain control.  He was seen in the ED on 03/17/2023.  CTA chest was negative for PE but did show small left pleural effusion with left base atelectasis and fluid and stranding around the pancreatic tail as well as presumed pancreatic pseudocyst.  Patient reports smoking 1.5 PPD.  He also reports usually drinking 3-4 beers daily but no alcohol for the last 5 days.  He denies any withdrawal symptoms.  [facility] ED Course  Labs/Imaging on admission: I have personally reviewed following labs and imaging studies.  Initial vitals showed BP 117/84, pulse 86, RR 24, temp 98.5 F, SpO2 100% on room air.  Labs showed WBC 17.5, hemoglobin 11.0, platelets 258,000, sodium 132, potassium 4.0, bicarb 22, BUN 23, creatinine 1.25, serum glucose 131, troponin 3.  LFTs within normal limits.  Lipase 92.  CTA chest/abdomen/pelvis was negative for acute aortic dissection or aneurysm.  No significant vascular disease within the abdomen or pelvis.  Interval development of subcapsular splenic hematoma measuring up to 2.9 cm in thickness seen.  New large perisplenic slightly tense fluid collection  suggestive of contained rupture.  No extravasation of contrast to suggest active bleeding.  Findings suspicious for mild acute on chronic distal pancreatitis.  Multiple rim-enhancing fluid collections in the left upper quadrant, largest contiguous with fluid extending along the gastric fundus, measuring up to 7 cm.  These probably represent pseudocyst given findings of mild pancreatic inflammatory change and additional fluid collection at the tail of the pancreas per radiology read.  Small left-sided pleural effusion with adjacent atelectasis in the left base noted.  Patient was given 1 L normal saline, IV Dilaudid 0.5 mg x 2, IV fentanyl 50 mcg x 2.  EDP discussed with trauma surgery Dr. Bedelia Person who recommended medical admission to [facility] and they will see upon transfer.  EDP also discussed with GI Dr. Loreta Ave whose team will consult tomorrow.  The hospitalist service were consulted to admit for further evaluation and management.  Review of Systems: All systems reviewed and are negative except as documented in history of present illness above.   Past Medical History:  Diagnosis Date   Acute pancreatitis    Alcohol abuse    Eczema    High cholesterol    Hypertension    Tobacco abuse     Past Surgical History:  Procedure Laterality Date   WRIST SURGERY      Social History:  reports that he has been smoking cigarettes. He has a 60 pack-year smoking history. He uses smokeless tobacco. He reports current alcohol use of about 56.0 standard drinks of alcohol per week. He reports that he does not currently use  drugs.  Allergies  Allergen Reactions   Amoxicillin Anaphylaxis   Penicillins Anaphylaxis    Family History  Problem Relation Age of Onset   CVA Father      Prior to Admission medications   Medication Sig Start Date End Date Taking? Authorizing Provider  atorvastatin (LIPITOR) 40 MG tablet Take 1 tablet (40 mg total) by mouth daily. 01/25/21   Valetta Close, MD   doxycycline (VIBRAMYCIN) 100 MG capsule Take 1 capsule (100 mg total) by mouth 2 (two) times daily. 03/17/23   Cathren Laine, MD  DULoxetine (CYMBALTA) 20 MG capsule Take 20 mg by mouth 2 (two) times daily. 01/13/21   [provider]  hydrOXYzine (VISTARIL) 25 MG capsule Take 25 mg by mouth at bedtime. 11/29/20   [provider]  losartan (COZAAR) 25 MG tablet Take 25 mg by mouth daily. 09/16/20   [provider]  metFORMIN (GLUCOPHAGE) 500 MG tablet Take 1 tablet (500 mg total) by mouth daily with breakfast. 01/24/21 02/23/21  Jovita Kussmaul, MD  Multiple Vitamin (MULTIVITAMIN) capsule Take 1 capsule by mouth daily.    [provider]  nicotine (NICODERM CQ - DOSED IN MG/24 HOURS) 21 mg/24hr patch Place 1 patch (21 mg total) onto the skin daily. 01/25/21   Valetta Close, MD  ondansetron (ZOFRAN-ODT) 4 MG disintegrating tablet Take 1 tablet (4 mg total) by mouth every 8 (eight) hours as needed for nausea or vomiting. 12/03/22   Smoot, Shawn Route, PA-C  oxyCODONE-acetaminophen (PERCOCET/ROXICET) 5-325 MG tablet Take 1 tablet by mouth every 8 (eight) hours as needed for severe pain. Patient not taking: Reported on 03/23/2023 12/03/22   Smoot, Shawn Route, PA-C  tiZANidine (ZANAFLEX) 4 MG tablet Take 4 mg by mouth every 8 (eight) hours as needed for muscle spasms. 11/29/20   [provider]  traMADol (ULTRAM) 50 MG tablet Take 1 tablet (50 mg total) by mouth every 6 (six) hours as needed. 03/17/23   Cathren Laine, MD    Physical Exam: Vitals:   04/07/23 1532 04/07/23 1600 04/07/23 1925 04/07/23 2000  BP: 120/71  (!) 153/82 134/71  Pulse: 80  (!) 105 96  Resp: 16 (!) 22 20 18   Temp: 98.5 F (36.9 C)   98.6 F (37 C)  TempSrc: Oral   Oral  SpO2: 99%  97% 95%  Weight:      Height:       Constitutional: Sitting up in chair, NAD, calm, comfortable Eyes: EOMI, lids and conjunctivae normal ENMT: Mucous membranes are moist. Posterior pharynx clear of any exudate or  lesions.Normal dentition.  Neck: normal, supple, no masses. Respiratory: clear to auscultation bilaterally, no wheezing, no crackles. Normal respiratory effort. No accessory muscle use.  Cardiovascular: Regular rate and rhythm, no murmurs / rubs / gallops. No extremity edema. 2+ pedal pulses. Abdomen: LUQ tenderness, no masses palpated. Musculoskeletal: no clubbing / cyanosis. No joint deformity upper and lower extremities. Good ROM, no contractures. Normal muscle tone.  Skin: no rashes, lesions, ulcers. No induration Neurologic: Sensation intact. Strength 5/5 in all 4.  Psychiatric: Alert and oriented x 3. Normal mood.   EKG: Personally reviewed. Sinus rhythm, rate 87, RBBB.  Similar to previous.  Assessment/Plan Principal Problem:   Closed splenic rupture Active Problems:   Chronic pancreatitis (HCC)   Tobacco abuse   Chronic alcohol abuse   Hyperlipidemia   Pancreatic pseudocyst   Leukocytosis   Essential hypertension   Type 2 diabetes mellitus (HCC)   AKI (acute kidney injury) (  HCC)   Samuel Abbott is a 62 y.o. male with medical history significant for HTN, HLD, alcohol and tobacco use, history of pancreatitis and pancreatic pseudocyst who is admitted with splenic hematoma with contained rupture.  Assessment and Plan: Splenic hematoma with contained rupture: Seen on CT imaging.  Patient denies any obvious trauma/injury.  Does report frequent NSAID use recently.  He is having continued pain to the LUQ/left flank area. -Trauma surgery to consult -If patient becomes hemodynamically unstable or worsening anemia will need IR for embolization -Repeat H&H tonight -Analgesics as needed, avoid NSAIDs/blood thinners  Chronic pancreatitis: Suspect related to chronic alcohol use.  Lipase mildly elevated.  Primary symptoms likely related to the splenic hematoma/contained rupture.  Has not had any epigastric pain, nausea, or vomiting.  Pancreatic pseudocyst: Again seen on CT imaging.   Multiple rim-enhancing fluid collections in the LUQ largest contiguous with fluid extending along the gastric fundus, measuring up to 7 cm. -GI to consult in a.m.  Acute kidney injury: Mild with creatinine 1.25 compared to baseline 0.6-0.7.  S/p 1 L fluid bolus.  Recheck labs in AM.  Has been using a lot of NSAIDs recently.  Leukocytosis: Suspect reactive leukocytosis.  Repeat CBC in AM.  Hypertension: BP is currently stable.  Hold losartan for now.  Type 2 diabetes: Hold metformin, continue SSI.  Hyperlipidemia: Continue atorvastatin.  Alcohol use: Reports usually drinking 3-4 beers daily but none for the last 5 days.  No withdrawal symptoms.  Tobacco use: He reports smoking 1.5 PPD.  Nicotine patch provided.   DVT prophylaxis: SCDs Start: 04/07/23 2051 Code Status: Full code, confirmed with patient on admission Family Communication: Discussed with patient, he has discussed with family Disposition Plan: From home and likely discharge to home pending clinical progress Consults called: Trauma surgery, GI (Mann/[place]) Severity of Illness: The appropriate patient status for this patient is INPATIENT. Inpatient status is judged to be reasonable and necessary in order to provide the required intensity of service to ensure the patient's safety. The patient's presenting symptoms, physical exam findings, and initial radiographic and laboratory data in the context of their chronic comorbidities is felt to place them at high risk for further clinical deterioration. Furthermore, it is not anticipated that the patient will be medically stable for discharge from the hospital within 2 midnights of admission.   * I certify that at the point of admission it is my clinical judgment that the patient will require inpatient hospital care spanning beyond 2 midnights from the point of admission due to high intensity of service, high risk for further deterioration and high frequency of surveillance  required.Darreld Mclean MD [facility]  If 7PM-7AM, please contact night-coverage [email or web address]  04/07/2023, 9:11 PM

## 2023-04-07 NOTE — ED Triage Notes (Signed)
CO severe left side pain on chest all the way up to the neck, stated was seen recently for same and had some fluid accumulation in the chest area.

## 2023-04-07 NOTE — ED Provider Notes (Signed)
[facility] Provider Note   CSN: [id number] Arrival date & time: 04/07/23  1219     History  Chief Complaint  Patient presents with   Chest Pain    Samuel Abbott is a 62 y.o. male.  Patient here with left sided chest pain/flank pain.  Ongoing for couple weeks a little bit worse today.  Was seen here recently for the same was diagnosed with some fluid on his lungs and he has had a known pancreatic cyst.  He states occasional alcohol use, pancreatitis history, hypertension, high cholesterol.  Denies any other substance abuse.  Denies any nausea vomiting diarrhea.  Denies any weakness numbness tingling.  States the pain goes all the way up and down his left side.  He follow with pulmonology for the pleural effusion and he is yet to follow-up with GI about his pancreatic cyst.  The history is provided by the patient.       Home Medications Prior to Admission medications   Medication Sig Start Date End Date Taking? Authorizing Provider  atorvastatin (LIPITOR) 40 MG tablet Take 1 tablet (40 mg total) by mouth daily. 01/25/21  Yes Valetta Close, MD  DULoxetine (CYMBALTA) 20 MG capsule Take 20 mg by mouth 2 (two) times daily. 01/13/21  Yes [provider]  hydrOXYzine (VISTARIL) 25 MG capsule Take 25 mg by mouth at bedtime. 11/29/20  Yes [provider]  losartan (COZAAR) 25 MG tablet Take 25 mg by mouth daily. 09/16/20  Yes [provider]  metFORMIN (GLUCOPHAGE) 500 MG tablet Take 1 tablet (500 mg total) by mouth daily with breakfast. 01/24/21 04/07/23 Yes Maness, Philip, MD  Multiple Vitamin (MULTIVITAMIN) capsule Take 1 capsule by mouth daily.   Yes [provider]  ondansetron (ZOFRAN-ODT) 4 MG disintegrating tablet Take 1 tablet (4 mg total) by mouth every 8 (eight) hours as needed for nausea or vomiting. 12/03/22  Yes Smoot, Shawn Route, PA-C  oxyCODONE-acetaminophen (PERCOCET/ROXICET) 5-325 MG tablet Take 1  tablet by mouth every 8 (eight) hours as needed for severe pain. 12/03/22  Yes Smoot, Sarah A, PA-C  tiZANidine (ZANAFLEX) 4 MG tablet Take 4 mg by mouth every 8 (eight) hours as needed for muscle spasms. 11/29/20  Yes [provider]  traMADol (ULTRAM) 50 MG tablet Take 1 tablet (50 mg total) by mouth every 6 (six) hours as needed. Patient taking differently: Take 50 mg by mouth every 6 (six) hours as needed for moderate pain. 03/17/23  Yes Cathren Laine, MD  nicotine (NICODERM CQ - DOSED IN MG/24 HOURS) 21 mg/24hr patch Place 1 patch (21 mg total) onto the skin daily. Patient not taking: Reported on 04/07/2023 01/25/21   Valetta Close, MD      Allergies    Amoxicillin and Penicillins    Review of Systems   Review of Systems  Physical Exam Updated Vital Signs BP 123/69 (BP Location: Left Arm)   Pulse 86   Temp 98.1 F (36.7 C) (Oral)   Resp 17   Ht 6' (1.829 m)   Wt 75.8 kg   SpO2 92%   BMI 22.65 kg/m  Physical Exam Vitals and nursing note reviewed.  Constitutional:      General: He is not in acute distress.    Appearance: He is well-developed. He is not ill-appearing.  HENT:     Head: Normocephalic and atraumatic.  Eyes:     Extraocular Movements: Extraocular movements intact.     Conjunctiva/sclera:  Conjunctivae normal.     Pupils: Pupils are equal, round, and reactive to light.  Cardiovascular:     Rate and Rhythm: Normal rate and regular rhythm.     Heart sounds: No murmur heard. Pulmonary:     Effort: Pulmonary effort is normal. No respiratory distress.     Breath sounds: Normal breath sounds. No decreased breath sounds.  Chest:     Chest wall: Tenderness present.  Abdominal:     Palpations: Abdomen is soft.     Tenderness: There is abdominal tenderness.  Musculoskeletal:        General: No swelling.     Cervical back: Normal range of motion and neck supple.  Skin:    General: Skin is warm and dry.     Capillary Refill: Capillary refill takes less  than 2 seconds.  Neurological:     Mental Status: He is alert.  Psychiatric:        Mood and Affect: Mood normal.     ED Results / Procedures / Treatments   Labs (all labs ordered are listed, but only abnormal results are displayed) Labs Reviewed  BASIC METABOLIC PANEL - Abnormal; Notable for the following components:      Result Value   Sodium 132 (*)    Chloride 96 (*)    Glucose, Bld 131 (*)    BUN 23 (*)    Creatinine, Ser 1.25 (*)    All other components within normal limits  CBC - Abnormal; Notable for the following components:   WBC 17.5 (*)    RBC 3.56 (*)    Hemoglobin 11.0 (*)    HCT 32.6 (*)    All other components within normal limits  LIPASE, BLOOD - Abnormal; Notable for the following components:   Lipase 92 (*)    All other components within normal limits  CBC - Abnormal; Notable for the following components:   WBC 17.7 (*)    RBC 3.19 (*)    Hemoglobin 9.6 (*)    HCT 29.1 (*)    All other components within normal limits  BASIC METABOLIC PANEL - Abnormal; Notable for the following components:   Sodium 130 (*)    Chloride 96 (*)    Glucose, Bld 154 (*)    All other components within normal limits  HEMOGLOBIN AND HEMATOCRIT, BLOOD - Abnormal; Notable for the following components:   Hemoglobin 9.6 (*)    HCT 28.4 (*)    All other components within normal limits  GLUCOSE, CAPILLARY - Abnormal; Notable for the following components:   Glucose-Capillary 134 (*)    All other components within normal limits  GLUCOSE, CAPILLARY - Abnormal; Notable for the following components:   Glucose-Capillary 130 (*)    All other components within normal limits  GLUCOSE, CAPILLARY - Abnormal; Notable for the following components:   Glucose-Capillary 225 (*)    All other components within normal limits  GLUCOSE, CAPILLARY - Abnormal; Notable for the following components:   Glucose-Capillary 107 (*)    All other components within normal limits  HEPATIC FUNCTION PANEL   HIV ANTIBODY (ROUTINE TESTING W REFLEX)  GLUCOSE, CAPILLARY  CBC  TROPONIN I (HIGH SENSITIVITY)    EKG EKG Interpretation Date/Time:  Wednesday April 07 2023 12:35:37 EDT Ventricular Rate:  87 PR Interval:  168 QRS Duration:  146 QT Interval:  380 QTC Calculation: 458 R Axis:   68  Text Interpretation: Sinus rhythm Right bundle branch block Confirmed by Virgina Norfolk [phone]) on 04/07/2023  12:40:41 PM  Radiology CT Angio Chest/Abd/Pel for Dissection W and/or Wo Contrast  Result Date: 04/07/2023 CLINICAL DATA:  Severe left chest pain radiating to the neck EXAM: CT ANGIOGRAPHY CHEST, ABDOMEN AND PELVIS TECHNIQUE: Non-contrast CT of the chest was initially obtained. Multidetector CT imaging through the chest, abdomen and pelvis was performed using the standard protocol during bolus administration of intravenous contrast. Multiplanar reconstructed images and MIPs were obtained and reviewed to evaluate the vascular anatomy. RADIATION DOSE REDUCTION: This exam was performed according to the departmental dose-optimization program which includes automated exposure control, adjustment of the mA and/or kV according to patient size and/or use of iterative reconstruction technique. CONTRAST:  OMNIPAQUE IOHEXOL 350 MG/ML SOLN COMPARISON:  Chest CT 03/17/2023, 04/07/2023 radiograph, CT 12/03/2022 FINDINGS: CTA CHEST FINDINGS Cardiovascular: Non contrasted images of the chest demonstrate no acute intramural hematoma. Negative for aneurysm or acute aortic dissection. Normal cardiac size. No pericardial effusion Mediastinum/Nodes: Midline trachea. No thyroid mass. Subcentimeter mediastinal lymph nodes. Esophagus within normal limits. Lungs/Pleura: Small left-sided pleural effusion with adjacent atelectasis at the left base. Negative for pneumothorax. Right apical scarring with calcification. Musculoskeletal: No acute or suspicious osseous abnormality. Review of the MIP images confirms the above  findings. CTA ABDOMEN AND PELVIS FINDINGS VASCULAR Aorta: Normal caliber aorta without aneurysm, dissection, vasculitis or significant stenosis. Celiac: Patent without evidence of aneurysm, dissection, vasculitis or significant stenosis. SMA: Patent without evidence of aneurysm, dissection, vasculitis or significant stenosis. Renals: 2 right and 2 left renal arteries are patent without evidence of aneurysm, dissection, vasculitis, fibromuscular dysplasia or significant stenosis. IMA: Patent without evidence of aneurysm, dissection, vasculitis or significant stenosis. Inflow: Patent without evidence of aneurysm, dissection, vasculitis or significant stenosis. Veins: No obvious venous abnormality within the limitations of this arterial phase study. Review of the MIP images confirms the above findings. NON-VASCULAR Hepatobiliary: No focal liver abnormality is seen. No gallstones, gallbladder wall thickening, or biliary dilatation. Pancreas: Mild pancreatic head calcifications felt consistent with chronic pancreatitis. Minimal stranding adjacent to the pancreatic tail likely recent or mild pancreatitis. Fluid collection at the pancreatic tail, extending towards the splenic hilus, this measures 4 by 2.6 cm. Spleen: Interim development large Peri splenic fluid collection. Subcapsular slightly dense splenic hematoma measuring up to 2.9 cm in thickness. No extravasation to suggest active bleeding at this time. Adrenals/Urinary Tract: Adrenal glands are within normal limits. Kidneys show no hydronephrosis. The bladder is unremarkable Stomach/Bowel: The stomach is nonenlarged. There is no dilated small bowel. Negative appendix. No acute bowel wall thickening Lymphatic: No suspicious lymph node Reproductive: Negative prostate Other: No free air. Rim enhancing fluid collections in the left upper quadrant, for example 4.6 by 2.3 cm collection on series 302, image 110. This is contiguous with fluid extending along the gastric  fundus, this collection measures 7 by 2.6 cm on coronal series 601, image 111. Multiple additional smaller fluid collections. Musculoskeletal: No acute or suspicious osseous abnormality. Review of the MIP images confirms the above findings. IMPRESSION: 1. Negative for acute aortic dissection or aneurysm. No significant vascular disease within the abdomen or pelvis 2. Interim development of subcapsular splenic hematoma measuring up to 2.9 cm in thickness. New large perisplenic slightly dense fluid collection suggestive of contained rupture. No extravasation of contrast to suggest active bleeding on this exam. 3. Findings suspicious for mild acute on chronic distal pancreatitis. Multiple rim enhancing fluid collections in the left upper quadrant, largest contiguous with fluid extending along the gastric fundus, measuring up to 7 cm. These probably  represent pseudocyst given findings of mild pancreatic inflammatory change and additional fluid collection at the tail of pancreas. 4. Small left-sided pleural effusion with adjacent atelectasis at the left base. Electronically Signed   By: Jasmine Pang M.D.   On: 04/07/2023 16:19   DG Chest Portable 1 View  Result Date: 04/07/2023 CLINICAL DATA:  Chest pain and shortness of breath. EXAM: PORTABLE CHEST 1 VIEW COMPARISON:  Chest x-ray 03/17/2023.  Chest CT 03/17/2023. FINDINGS: There are findings suspicious for tiny right apical pneumothorax. Biapical calcifications are again noted. Left pleural effusion has resolved. Left basilar atelectasis persists. There is no mediastinal shift. The cardiomediastinal silhouette is within limits. No acute fractures are seen. Questionable IMPRESSION: 1. Findings suspicious for tiny right apical pneumothorax. 2. Left pleural effusion has resolved. Left basilar atelectasis persists. These results were called by telephone at the time of interpretation on 04/07/2023 at 3:17 pm to provider Dr. Gayleen Orem, who verbally acknowledged these results.  Electronically Signed   By: Darliss Cheney M.D.   On: 04/07/2023 15:18    Procedures Procedures    Medications Ordered in ED Medications  sodium chloride flush (NS) 0.9 % injection 3 mL (3 mLs Intravenous Given 04/08/23 2103)  ondansetron (ZOFRAN) tablet 4 mg (4 mg Oral Given 04/09/23 0550)    Or  ondansetron (ZOFRAN) injection 4 mg ( Intravenous See Alternative 04/09/23 0550)  senna-docusate (Senokot-S) tablet 1 tablet (has no administration in time range)  nicotine (NICODERM CQ - dosed in mg/24 hours) patch 21 mg (21 mg Transdermal Patch Applied 04/08/23 0848)  atorvastatin (LIPITOR) tablet 40 mg (40 mg Oral Given 04/08/23 0848)  DULoxetine (CYMBALTA) DR capsule 20 mg (20 mg Oral Given 04/08/23 2103)  hydrOXYzine (ATARAX) tablet 25 mg (25 mg Oral Given 04/08/23 2102)  insulin aspart (novoLOG) injection 0-9 Units (5 Units Subcutaneous Given 04/08/23 1555)  methocarbamol (ROBAXIN) tablet 1,000 mg (1,000 mg Oral Given 04/09/23 0545)  acetaminophen (TYLENOL) tablet 1,000 mg (1,000 mg Oral Patient Refused/Not Given 04/09/23 0545)  HYDROmorphone (DILAUDID) injection 0.5 mg (0.5 mg Intravenous Given 04/09/23 0545)  oxyCODONE (Oxy IR/ROXICODONE) immediate release tablet 10 mg (10 mg Oral Given 04/09/23 0327)  diphenhydrAMINE (BENADRYL) capsule 25 mg (has no administration in time range)  multivitamin with minerals tablet 1 tablet (has no administration in time range)  fentaNYL (SUBLIMAZE) injection 50 mcg (50 mcg Intravenous Given 04/07/23 1258)  fentaNYL (SUBLIMAZE) injection 50 mcg (50 mcg Intravenous Given 04/07/23 1422)  sodium chloride 0.9 % bolus 1,000 mL ( Intravenous Stopped 04/07/23 1532)  iohexol (OMNIPAQUE) 350 MG/ML injection 100 mL (100 mLs Intravenous Contrast Given 04/07/23 1415)  HYDROmorphone (DILAUDID) injection 0.5 mg (0.5 mg Intravenous Given 04/07/23 1638)  HYDROmorphone (DILAUDID) injection 0.5 mg (0.5 mg Intravenous Given 04/07/23 1835)  HYDROmorphone (DILAUDID) injection 0.5 mg (0.5 mg  Intravenous Given 04/08/23 0849)    ED Course/ Medical Decision Making/ A&P Clinical Course as of 04/09/23 0714  Wed Apr 07, 2023  1630 CT Angio Chest/Abd/Pel for Dissection W and/or Wo Contrast 1. Negative for acute aortic dissection or aneurysm. No significant vascular disease within the abdomen or pelvis 2. Interim development of subcapsular splenic hematoma measuring up to 2.9 cm in thickness. New large perisplenic slightly dense fluid collection suggestive of contained rupture. No extravasation of contrast to suggest active bleeding on this exam. 3. Findings suspicious for mild acute on chronic distal pancreatitis. Multiple rim enhancing fluid collections in the left upper quadrant, largest contiguous with fluid extending along the gastric fundus, measuring up to 7  cm. These probably represent pseudocyst given findings of mild pancreatic inflammatory change and additional fluid collection at the tail of pancreas. 4. Small left-sided pleural effusion with adjacent atelectasis at the left base.   [HN]  [id number] D/w patient.  He swears up and down that he has had no trauma.  He denies any falls, car accidents.  I ask if it is possible if he potentially fell and does not remember and he states that there is no way that would happen.  He states that the pain is the same as it was before however this splenic laceration/contained rupture is new since the prior study.  Unsure exactly the mechanism behind his injury.  Will page to trauma surgery and provide additional IV analgesia.  He states his pain currently is 10 out of 10. [HN]  1648 Hemoglobin(!): 11.0 Hgb was 12.3 three weeks ago [HN]  1648 D/w Dr. Bedelia Person w/ trauma who requests transfer to [facility] and admission to medicine service. He is HDS and w/ Hgb 11, can be managed w/ blood and IR and hopefully nonoperatively. 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 Consulting to hospitalist. [HN]  1735 D/w Dr. Isidoro Donning who states that she spoke about the case  with Dr. Leafy Half and will stay on as a consult. She also requests we discuss with GI. I requested that Dr. Isidoro Donning speak with Dr. Bedelia Person. [HN]  1750 D/w Dr. Loreta Ave with GI who states that they will see patient tomorrow morning.  D/w Dr. Isidoro Donning again who states that she spoke with Dr. Bedelia Person and internal medicine will admit patient to [facility] with trauma following. [HN]    Clinical Course User Index [HN] Loetta Rough, MD                                 Medical Decision Making Amount and/or Complexity of Data Reviewed Labs: ordered. Decision-making details documented in ED Course. Radiology: ordered. Decision-making details documented in ED Course.  Risk Prescription drug management. Decision regarding hospitalization.   [name] is here with left-sided chest pain and abdominal pain.  History of pancreatitis, alcohol use, hypertension, high cholesterol.  He had extensive workup here few weeks ago that was fairly unremarkable except for pancreatic cyst and pleural effusion.  He had a negative PE scan for blood clot.  Does not have any shortness of breath.  Does endorse alcohol use has not followed up with GI did follow-up with pulmonary.  Overall differentials why could be pancreatitis versus less likely ACS, dissection, pancreatitis, infectious process.  Have no concern for PE.  Discussed normal vitals.  EKG shows sinus rhythm.  No ischemic changes.  Will order dissection study given his diffuse pain.  Will recheck labs including CBC, CMP, lipase, troponin.  Per my review and interpretation of labs creatinine is 1.2 but otherwise no significant electrolyte abnormality.  White count is 17.5.  Troponins normal at 3.  Lipase is 92.  Patient pending chest x-ray, CT dissection study.  He has been given IV fentanyl for pain.  Handed off to oncoming ED staff with patient pending CT.   This chart was dictated using voice recognition software.  Despite best efforts to proofread,  errors can occur  which can change the documentation meaning.         Final Clinical Impression(s) / ED Diagnoses Final diagnoses:  Pancreatic cyst  Nonspecific chest pain  Splenic rupture    Rx /  DC Orders ED Discharge Orders     None         Virgina Norfolk, DO 04/09/23 0715

## 2023-04-07 NOTE — ED Notes (Signed)
Patient back from CT, requesting more pain medicine and something to drink. EDP made aware.

## 2023-04-07 NOTE — Hospital Course (Signed)
Samuel Abbott is a 62 y.o. male with medical history significant for HTN, HLD, alcohol and tobacco use, history of pancreatitis and pancreatic pseudocyst who is admitted with splenic hematoma with contained rupture.

## 2023-04-07 NOTE — ED Provider Notes (Signed)
3:08 PM Assumed care of patient from off-going team. For more details, please see note from same day.  In brief, this is a 62 y.o. male +EtOH history Presented with CP Has pleural effusion and pancreatic pseudocyst on prior CT PE Was supposed to get repeat CT scan of abdomen in May, had recommended repeat Two weeks ago came for similar sxs, had PE study which was negative Already follows with pulm  Plan/Dispo at time of sign-out & ED Course since sign-out: CT dissection study Likely carafate/protonix if normal, f/u with GI  BP 117/84 (BP Location: Left Arm)   Pulse 87   Temp 98.5 F (36.9 C)   Resp (!) 24   Ht 6' (1.829 m)   Wt 75.8 kg   SpO2 100%   BMI 22.65 kg/m    ED Course:   Clinical Course as of 04/07/23 1752  Wed Apr 07, 2023  1630 CT Angio Chest/Abd/Pel for Dissection W and/or Wo Contrast 1. Negative for acute aortic dissection or aneurysm. No significant vascular disease within the abdomen or pelvis 2. Interim development of subcapsular splenic hematoma measuring up to 2.9 cm in thickness. New large perisplenic slightly dense fluid collection suggestive of contained rupture. No extravasation of contrast to suggest active bleeding on this exam. 3. Findings suspicious for mild acute on chronic distal pancreatitis. Multiple rim enhancing fluid collections in the left upper quadrant, largest contiguous with fluid extending along the gastric fundus, measuring up to 7 cm. These probably represent pseudocyst given findings of mild pancreatic inflammatory change and additional fluid collection at the tail of pancreas. 4. Small left-sided pleural effusion with adjacent atelectasis at the left base.   [HN]  [id number] D/w patient.  He swears up and down that he has had no trauma.  He denies any falls, car accidents.  I ask if it is possible if he potentially fell and does not remember and he states that there is no way that would happen.  He states that the pain is the same as  it was before however this splenic laceration/contained rupture is new since the prior study.  Unsure exactly the mechanism behind his injury.  Will page to trauma surgery and provide additional IV analgesia.  He states his pain currently is 10 out of 10. [HN]  1648 Hemoglobin(!): 11.0 Hgb was 12.3 three weeks ago [HN]  1648 D/w Dr. Bedelia Person w/ trauma who requests transfer to [facility] and admission to medicine service. He is HDS and w/ Hgb 11, can be managed w/ blood and IR and hopefully nonoperatively. 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 Consulting to hospitalist. [HN]  1735 D/w Dr. Isidoro Donning who states that she spoke about the case with Dr. Leafy Half and will stay on as a consult. She also requests we discuss with GI. I requested that Dr. Isidoro Donning speak with Dr. Bedelia Person. [HN]  1750 D/w Dr. Loreta Ave with GI who states that they will see patient tomorrow morning.  D/w Dr. Isidoro Donning again who states that she spoke with Dr. Bedelia Person and internal medicine will admit patient to [facility] with trauma following. [HN]    Clinical Course User Index [HN] Loetta Rough, MD    Dispo: Admitted to hospitalist at [facility] with trauma, GI following ------------------------------- Vivi Barrack, MD Emergency Medicine  This note was created using dictation software, which may contain spelling or grammatical errors.   Loetta Rough, MD 04/07/23 [phone]

## 2023-04-07 NOTE — ED Notes (Addendum)
ED TO INPATIENT HANDOFF REPORT  ED Nurse Name and Phone #: Dominga Ferry [facility] Paramedic [phone]  S Name/Age/Gender Samuel Abbott 62 y.o. male Room/Bed: MH06/MH06  Code Status   Code Status: Prior  Home/SNF/Other Home Patient oriented to: self, place, time, and situation Is this baseline? Yes   Triage Complete: Triage complete  Chief Complaint Closed splenic rupture [S36.09XA]  Triage Note CO severe left side pain on chest all the way up to the neck, stated was seen recently for same and had some fluid accumulation in the chest area.    Allergies Allergies  Allergen Reactions   Amoxicillin Anaphylaxis   Penicillins Anaphylaxis    Level of Care/Admitting Diagnosis ED Disposition     ED Disposition  Admit   Condition  --   Comment  Hospital Area: [facility] [[id number]]  Level of Care: Progressive [102]  Admit to Progressive based on following criteria: MULTISYSTEM THREATS such as stable sepsis, metabolic/electrolyte imbalance with or without encephalopathy that is responding to early treatment.  Admit to Progressive based on following criteria: GI, ENDOCRINE disease patients with GI bleeding, acute liver failure or pancreatitis, stable with diabetic ketoacidosis or thyrotoxicosis (hypothyroid) state.  May admit patient to [facility] or [facility] if equivalent level of care is available:: No  Interfacility transfer: Yes  Covid Evaluation: Asymptomatic - no recent exposure (last 10 days) testing not required  Diagnosis: Closed splenic rupture [[id number]]  Admitting Physician: RAI, RIPUDEEP K [[id number]]  Attending Physician: RAI, RIPUDEEP K [[id number]]  Certification:: I certify this patient will need inpatient services for at least 2 midnights  Expected Medical Readiness: 04/12/2023          B Medical/Surgery History Past Medical History:  Diagnosis Date   Acute pancreatitis    Alcohol abuse    Eczema    High cholesterol    Hypertension     Tobacco abuse    Past Surgical History:  Procedure Laterality Date   WRIST SURGERY       A IV Location/Drains/Wounds Patient Lines/Drains/Airways Status     Active Line/Drains/Airways     Name Placement date Placement time Site Days   Peripheral IV 04/07/23 20 G Right Antecubital 04/07/23  1251  Antecubital  less than 1            Intake/Output Last 24 hours  Intake/Output Summary (Last 24 hours) at 04/07/2023 1826 Last data filed at 04/07/2023 1540 Gross per 24 hour  Intake 1000.46 ml  Output --  Net 1000.46 ml    Labs/Imaging Results for orders placed or performed during the hospital encounter of 04/07/23 (from the past 48 hour(s))  Basic metabolic panel     Status: Abnormal   Collection Time: 04/07/23 12:43 PM  Result Value Ref Range   Sodium 132 (L) 135 - 145 mmol/L   Potassium 4.0 3.5 - 5.1 mmol/L   Chloride 96 (L) 98 - 111 mmol/L   CO2 22 22 - 32 mmol/L   Glucose, Bld 131 (H) 70 - 99 mg/dL    Comment: Glucose reference range applies only to samples taken after fasting for at least 8 hours.   BUN 23 (H) 6 - 20 mg/dL   Creatinine, Ser 1.47 (H) 0.61 - 1.24 mg/dL   Calcium 9.2 8.9 - 82.9 mg/dL   GFR, Estimated >56 >21 mL/min    Comment: (NOTE) Calculated using the CKD-EPI Creatinine Equation (2021)    Anion gap 14 5 - 15    Comment: Performed at  [facility], [address] [place], [place] [address]  CBC     Status: Abnormal   Collection Time: 04/07/23 12:43 PM  Result Value Ref Range   WBC 17.5 (H) 4.0 - 10.5 K/uL   RBC 3.56 (L) 4.22 - 5.81 MIL/uL   Hemoglobin 11.0 (L) 13.0 - 17.0 g/dL   HCT 60.4 (L) 54.0 - 98.1 %   MCV 91.6 80.0 - 100.0 fL   MCH 30.9 26.0 - 34.0 pg   MCHC 33.7 30.0 - 36.0 g/dL   RDW 19.1 47.8 - 29.5 %   Platelets 258 150 - 400 K/uL   nRBC 0.0 0.0 - 0.2 %    Comment: Performed at [facility], [address] [place], [place] [address]  Troponin I (High Sensitivity)     Status: None   Collection Time:  04/07/23 12:43 PM  Result Value Ref Range   Troponin I (High Sensitivity) 3 <18 ng/L    Comment: (NOTE) Elevated high sensitivity troponin I (hsTnI) values and significant  changes across serial measurements may suggest ACS but many other  chronic and acute conditions are known to elevate hsTnI results.  Refer to the "Links" section for chest pain algorithms and additional  guidance. Performed at [facility], [address] [place], [place] [address]   Hepatic function panel     Status: None   Collection Time: 04/07/23 12:43 PM  Result Value Ref Range   Total Protein 8.1 6.5 - 8.1 g/dL   Albumin 3.7 3.5 - 5.0 g/dL   AST 23 15 - 41 U/L   ALT 22 0 - 44 U/L   Alkaline Phosphatase 114 38 - 126 U/L   Total Bilirubin 1.1 0.3 - 1.2 mg/dL   Bilirubin, Direct 0.2 0.0 - 0.2 mg/dL   Indirect Bilirubin 0.9 0.3 - 0.9 mg/dL    Comment: Performed at [facility], [address] [place], [place] [address]  Lipase, blood     Status: Abnormal   Collection Time: 04/07/23 12:43 PM  Result Value Ref Range   Lipase 92 (H) 11 - 51 U/L    Comment: Performed at [facility], [address] [place], [place] [address]   CT Angio Chest/Abd/Pel for Dissection W and/or Wo Contrast  Result Date: 04/07/2023 CLINICAL DATA:  Severe left chest pain radiating to the neck EXAM: CT ANGIOGRAPHY CHEST, ABDOMEN AND PELVIS TECHNIQUE: Non-contrast CT of the chest was initially obtained. Multidetector CT imaging through the chest, abdomen and pelvis was performed using the standard protocol during bolus administration of intravenous contrast. Multiplanar reconstructed images and MIPs were obtained and reviewed to evaluate the vascular anatomy. RADIATION DOSE REDUCTION: This exam was performed according to the departmental dose-optimization program which includes automated exposure control, adjustment of the mA and/or kV according to patient size and/or use of iterative reconstruction  technique. CONTRAST:  OMNIPAQUE IOHEXOL 350 MG/ML SOLN COMPARISON:  Chest CT 03/17/2023, 04/07/2023 radiograph, CT 12/03/2022 FINDINGS: CTA CHEST FINDINGS Cardiovascular: Non contrasted images of the chest demonstrate no acute intramural hematoma. Negative for aneurysm or acute aortic dissection. Normal cardiac size. No pericardial effusion Mediastinum/Nodes: Midline trachea. No thyroid mass. Subcentimeter mediastinal lymph nodes. Esophagus within normal limits. Lungs/Pleura: Small left-sided pleural effusion with adjacent atelectasis at the left base. Negative for pneumothorax. Right apical scarring with calcification. Musculoskeletal: No acute or suspicious osseous abnormality. Review of the MIP images confirms the above findings. CTA ABDOMEN AND  PELVIS FINDINGS VASCULAR Aorta: Normal caliber aorta without aneurysm, dissection, vasculitis or significant stenosis. Celiac: Patent without evidence of aneurysm, dissection, vasculitis or significant stenosis. SMA: Patent without evidence of aneurysm, dissection, vasculitis or significant stenosis. Renals: 2 right and 2 left renal arteries are patent without evidence of aneurysm, dissection, vasculitis, fibromuscular dysplasia or significant stenosis. IMA: Patent without evidence of aneurysm, dissection, vasculitis or significant stenosis. Inflow: Patent without evidence of aneurysm, dissection, vasculitis or significant stenosis. Veins: No obvious venous abnormality within the limitations of this arterial phase study. Review of the MIP images confirms the above findings. NON-VASCULAR Hepatobiliary: No focal liver abnormality is seen. No gallstones, gallbladder wall thickening, or biliary dilatation. Pancreas: Mild pancreatic head calcifications felt consistent with chronic pancreatitis. Minimal stranding adjacent to the pancreatic tail likely recent or mild pancreatitis. Fluid collection at the pancreatic tail, extending towards the splenic hilus, this measures 4  by 2.6 cm. Spleen: Interim development large Peri splenic fluid collection. Subcapsular slightly dense splenic hematoma measuring up to 2.9 cm in thickness. No extravasation to suggest active bleeding at this time. Adrenals/Urinary Tract: Adrenal glands are within normal limits. Kidneys show no hydronephrosis. The bladder is unremarkable Stomach/Bowel: The stomach is nonenlarged. There is no dilated small bowel. Negative appendix. No acute bowel wall thickening Lymphatic: No suspicious lymph node Reproductive: Negative prostate Other: No free air. Rim enhancing fluid collections in the left upper quadrant, for example 4.6 by 2.3 cm collection on series 302, image 110. This is contiguous with fluid extending along the gastric fundus, this collection measures 7 by 2.6 cm on coronal series 601, image 111. Multiple additional smaller fluid collections. Musculoskeletal: No acute or suspicious osseous abnormality. Review of the MIP images confirms the above findings. IMPRESSION: 1. Negative for acute aortic dissection or aneurysm. No significant vascular disease within the abdomen or pelvis 2. Interim development of subcapsular splenic hematoma measuring up to 2.9 cm in thickness. New large perisplenic slightly dense fluid collection suggestive of contained rupture. No extravasation of contrast to suggest active bleeding on this exam. 3. Findings suspicious for mild acute on chronic distal pancreatitis. Multiple rim enhancing fluid collections in the left upper quadrant, largest contiguous with fluid extending along the gastric fundus, measuring up to 7 cm. These probably represent pseudocyst given findings of mild pancreatic inflammatory change and additional fluid collection at the tail of pancreas. 4. Small left-sided pleural effusion with adjacent atelectasis at the left base. Electronically Signed   By: Jasmine Pang M.D.   On: 04/07/2023 16:19   DG Chest Portable 1 View  Result Date: 04/07/2023 CLINICAL DATA:   Chest pain and shortness of breath. EXAM: PORTABLE CHEST 1 VIEW COMPARISON:  Chest x-ray 03/17/2023.  Chest CT 03/17/2023. FINDINGS: There are findings suspicious for tiny right apical pneumothorax. Biapical calcifications are again noted. Left pleural effusion has resolved. Left basilar atelectasis persists. There is no mediastinal shift. The cardiomediastinal silhouette is within limits. No acute fractures are seen. Questionable IMPRESSION: 1. Findings suspicious for tiny right apical pneumothorax. 2. Left pleural effusion has resolved. Left basilar atelectasis persists. These results were called by telephone at the time of interpretation on 04/07/2023 at 3:17 pm to provider Dr. Gayleen Orem, who verbally acknowledged these results. Electronically Signed   By: Darliss Cheney M.D.   On: 04/07/2023 15:18    Pending Labs Unresulted Labs (From admission, onward)    None       Vitals/Pain Today's Vitals   04/07/23 1326 04/07/23 1449 04/07/23 1532 04/07/23 1705  BP:  120/71   Pulse:   80   Resp:   16   Temp:   98.5 F (36.9 C)   TempSrc:   Oral   SpO2:   99%   Weight:      Height:      PainSc: 9  8   9      Isolation Precautions No active isolations  Medications Medications  fentaNYL (SUBLIMAZE) injection 50 mcg (50 mcg Intravenous Given 04/07/23 1258)  fentaNYL (SUBLIMAZE) injection 50 mcg (50 mcg Intravenous Given 04/07/23 1422)  sodium chloride 0.9 % bolus 1,000 mL ( Intravenous Stopped 04/07/23 1532)  iohexol (OMNIPAQUE) 350 MG/ML injection 100 mL (100 mLs Intravenous Contrast Given 04/07/23 1415)  HYDROmorphone (DILAUDID) injection 0.5 mg (0.5 mg Intravenous Given 04/07/23 1638)    Mobility walks     Focused Assessments     R Recommendations: See Admitting Provider Note  Report given to:   Additional Notes:  Called to give report, RN to call back when available.

## 2023-04-07 NOTE — ED Notes (Signed)
Called to give report to floor. RN to call back when available. Number left.

## 2023-04-07 NOTE — ED Notes (Signed)
Patient feels like his pain has not subsided with the medication administered.

## 2023-04-07 NOTE — ED Notes (Signed)
[facility] on site to transport patient to [facility]

## 2023-04-08 DIAGNOSIS — I10 Essential (primary) hypertension: Secondary | ICD-10-CM | POA: Diagnosis not present

## 2023-04-08 DIAGNOSIS — K860 Alcohol-induced chronic pancreatitis: Secondary | ICD-10-CM | POA: Diagnosis not present

## 2023-04-08 DIAGNOSIS — F1010 Alcohol abuse, uncomplicated: Secondary | ICD-10-CM

## 2023-04-08 DIAGNOSIS — S3609XA Other injury of spleen, initial encounter: Secondary | ICD-10-CM | POA: Diagnosis not present

## 2023-04-08 LAB — CBC
HCT: 29.1 % — ABNORMAL LOW (ref 39.0–52.0)
Hemoglobin: 9.6 g/dL — ABNORMAL LOW (ref 13.0–17.0)
MCH: 30.1 pg (ref 26.0–34.0)
MCHC: 33 g/dL (ref 30.0–36.0)
MCV: 91.2 fL (ref 80.0–100.0)
Platelets: 253 10*3/uL (ref 150–400)
RBC: 3.19 MIL/uL — ABNORMAL LOW (ref 4.22–5.81)
RDW: 12.9 % (ref 11.5–15.5)
WBC: 17.7 10*3/uL — ABNORMAL HIGH (ref 4.0–10.5)
nRBC: 0 % (ref 0.0–0.2)

## 2023-04-08 LAB — BASIC METABOLIC PANEL
Anion gap: 11 (ref 5–15)
BUN: 19 mg/dL (ref 6–20)
CO2: 23 mmol/L (ref 22–32)
Calcium: 9 mg/dL (ref 8.9–10.3)
Chloride: 96 mmol/L — ABNORMAL LOW (ref 98–111)
Creatinine, Ser: 0.93 mg/dL (ref 0.61–1.24)
GFR, Estimated: >60 mL/min (ref >60)
Glucose, Bld: 154 mg/dL — ABNORMAL HIGH (ref 70–99)
Potassium: 4.1 mmol/L (ref 3.5–5.1)
Sodium: 130 mmol/L — ABNORMAL LOW (ref 135–145)

## 2023-04-08 LAB — GLUCOSE, CAPILLARY
Glucose-Capillary: 130 mg/dL — ABNORMAL HIGH (ref 70–99)
Glucose-Capillary: 88 mg/dL (ref 70–99)
Glucose-Capillary: 225 mg/dL — ABNORMAL HIGH (ref 70–99)
Glucose-Capillary: 107 mg/dL — ABNORMAL HIGH (ref 70–99)

## 2023-04-08 LAB — HIV ANTIBODY (ROUTINE TESTING W REFLEX): HIV Screen 4th Generation wRfx: NONREACTIVE

## 2023-04-08 MED ORDER — ACETAMINOPHEN 500 MG PO TABS
1000.0000 mg | ORAL_TABLET | Freq: Four times a day (QID) | ORAL | Status: DC
  Administered 2023-04-08: 1000 mg via ORAL
  Filled 2023-04-08: qty 2

## 2023-04-08 MED ORDER — METHOCARBAMOL 500 MG PO TABS
1000.0000 mg | ORAL_TABLET | Freq: Three times a day (TID) | ORAL | Status: DC
  Administered 2023-04-08 – 2023-04-09 (×3): 1000 mg via ORAL
  Filled 2023-04-08 (×3): qty 2

## 2023-04-08 MED ORDER — OXYCODONE HCL 5 MG PO TABS
5.0000 mg | ORAL_TABLET | ORAL | Status: DC | PRN
  Administered 2023-04-08: 5 mg via ORAL
  Filled 2023-04-08: qty 1

## 2023-04-08 MED ORDER — ACETAMINOPHEN 500 MG PO TABS
1000.0000 mg | ORAL_TABLET | Freq: Three times a day (TID) | ORAL | Status: DC
  Administered 2023-04-08 (×2): 1000 mg via ORAL
  Filled 2023-04-08 (×3): qty 2

## 2023-04-08 MED ORDER — DIPHENHYDRAMINE HCL 25 MG PO CAPS
25.0000 mg | ORAL_CAPSULE | Freq: Three times a day (TID) | ORAL | Status: DC | PRN
  Filled 2023-04-08: qty 1

## 2023-04-08 MED ORDER — HYDROMORPHONE HCL 1 MG/ML IJ SOLN
0.5000 mg | INTRAMUSCULAR | Status: DC | PRN
  Administered 2023-04-08 – 2023-04-09 (×6): 0.5 mg via INTRAVENOUS
  Filled 2023-04-08 (×6): qty 0.5

## 2023-04-08 MED ORDER — OXYCODONE HCL 5 MG PO TABS
10.0000 mg | ORAL_TABLET | Freq: Four times a day (QID) | ORAL | Status: DC | PRN
  Administered 2023-04-08 – 2023-04-09 (×4): 10 mg via ORAL
  Filled 2023-04-08 (×4): qty 2

## 2023-04-08 NOTE — Progress Notes (Signed)
PROGRESS NOTE        PATIENT DETAILS Name: Samuel Abbott Age: 62 y.o. Sex: male Date of Birth: [birth year] Admit Date: 04/07/2023 Admitting Physician Ripudeep Jenna Luo, MD UJW:[id number], Suzan Slick, PA  Brief Summary: Patient is a 62 y.o.  male with HTN, HLD, EtOH use-who presented with left upper abdominal/left lower chest pain-found to have contained splenic rupture/hematoma and pancreatitis with pseudocyst.   Significant events: 9/25>> admit to [facility]  Significant studies: 9/25>> CT angio chest/abdomen/pelvis: No dissection-subcapsular splenic hematoma-no extravasation-acute on chronic distal pancreatitis-with multiple rim-enhancing fluid collections consistent with pseudocyst.  Significant microbiology data: None  Procedures: None  Consults: Trauma GI  Subjective: Continues to complain of left flank pain/left lower chest pain.  Objective: Vitals: Blood pressure 118/69, pulse 73, temperature 98.5 F (36.9 C), temperature source Oral, resp. rate 16, height 6' (1.829 m), weight 75.8 kg, SpO2 95%.   Exam: Gen Exam:Alert awake-not in any distress HEENT:atraumatic, normocephalic Chest: B/L clear to auscultation anteriorly CVS:S1S2 regular Abdomen:soft slightly tender in the left flank-left lower chest area.  No peritoneal signs. Extremities:no edema Neurology: Non focal Skin: no rash  Pertinent Labs/Radiology:    Latest Ref Rng & Units 04/08/2023    6:09 AM 04/07/2023    9:24 PM 04/07/2023   12:43 PM  CBC  WBC 4.0 - 10.5 K/uL 17.7   17.5   Hemoglobin 13.0 - 17.0 g/dL 9.6  9.6  95.6   Hematocrit 39.0 - 52.0 % 29.1  28.4  32.6   Platelets 150 - 400 K/uL 253   258     Lab Results  Component Value Date   NA 130 (L) 04/08/2023   K 4.1 04/08/2023   CL 96 (L) 04/08/2023   CO2 23 04/08/2023     Assessment/Plan: Contained  spontaneous splenic rupture with hematoma Trauma service following Supportive care recommended-follow CBC  Alcoholic  pancreatitis with pseudocysts Pancreatitis appears to be mostly chronic-no significant pain issues. No gallstone was noted on CT abdomen Easily tolerating diet GI input pending Supportive care in the interim  DM-2 CBG stable with SSI Hold metformin  HTN BP stable Losartan on hold  HLD Statin  Tobacco abuse Transdermal nicotine  EtOH use Last drink this past Friday Normally drinks 3-4 beers daily No withdrawal symptoms-watch closely  BMI: Estimated body mass index is 22.65 kg/m as calculated from the following:   Height as of this encounter: 6' (1.829 m).   Weight as of this encounter: 75.8 kg.   Code status:   Code Status: Full Code   DVT Prophylaxis: SCDs Start: 04/07/23 2051   Family Communication: None at bedside   Disposition Plan: Status is: Inpatient Remains inpatient appropriate because: Severity of illness   Planned Discharge Destination:Home   Diet: Diet Order             DIET SOFT Room service appropriate? Yes; Fluid consistency: Thin  Diet effective now                     Antimicrobial agents: Anti-infectives (From admission, onward)    None        MEDICATIONS: Scheduled Meds:  acetaminophen  1,000 mg Oral Q8H   atorvastatin  40 mg Oral Daily   DULoxetine  20 mg Oral BID   hydrOXYzine  25 mg Oral QHS   insulin aspart  0-9 Units  Subcutaneous TID WC   methocarbamol  1,000 mg Oral Q8H   nicotine  21 mg Transdermal Daily   sodium chloride flush  3 mL Intravenous Q12H   Continuous Infusions: PRN Meds:.ondansetron **OR** ondansetron (ZOFRAN) IV, oxyCODONE, senna-docusate   I have personally reviewed following labs and imaging studies  LABORATORY DATA: CBC: Recent Labs  Lab 04/07/23 1243 04/07/23 2124 04/08/23 0609  WBC 17.5*  --  17.7*  HGB 11.0* 9.6* 9.6*  HCT 32.6* 28.4* 29.1*  MCV 91.6  --  91.2  PLT 258  --  253    Basic Metabolic Panel: Recent Labs  Lab 04/07/23 1243 04/08/23 0609  NA 132* 130*  K  4.0 4.1  CL 96* 96*  CO2 22 23  GLUCOSE 131* 154*  BUN 23* 19  CREATININE 1.25* 0.93  CALCIUM 9.2 9.0    GFR: Estimated Creatinine Clearance: 90.6 mL/min (by C-G formula based on SCr of 0.93 mg/dL).  Liver Function Tests: Recent Labs  Lab 04/07/23 1243  AST 23  ALT 22  ALKPHOS 114  BILITOT 1.1  PROT 8.1  ALBUMIN 3.7   Recent Labs  Lab 04/07/23 1243  LIPASE 92*   No results for input(s): "AMMONIA" in the last 168 hours.  Coagulation Profile: No results for input(s): "INR", "PROTIME" in the last 168 hours.  Cardiac Enzymes: No results for input(s): "CKTOTAL", "CKMB", "CKMBINDEX", "TROPONINI" in the last 168 hours.  BNP (last 3 results) No results for input(s): "PROBNP" in the last 8760 hours.  Lipid Profile: No results for input(s): "CHOL", "HDL", "LDLCALC", "TRIG", "CHOLHDL", "LDLDIRECT" in the last 72 hours.  Thyroid Function Tests: No results for input(s): "TSH", "T4TOTAL", "FREET4", "T3FREE", "THYROIDAB" in the last 72 hours.  Anemia Panel: No results for input(s): "VITAMINB12", "FOLATE", "FERRITIN", "TIBC", "IRON", "RETICCTPCT" in the last 72 hours.  Urine analysis:    Component Value Date/Time   COLORURINE YELLOW 12/03/2022 1700   APPEARANCEUR CLEAR 12/03/2022 1700   LABSPEC >1.046 (H) 12/03/2022 1700   PHURINE 6.5 12/03/2022 1700   GLUCOSEU NEGATIVE 12/03/2022 1700   HGBUR NEGATIVE 12/03/2022 1700   BILIRUBINUR NEGATIVE 12/03/2022 1700   KETONESUR 15 (A) 12/03/2022 1700   PROTEINUR TRACE (A) 12/03/2022 1700   NITRITE NEGATIVE 12/03/2022 1700   LEUKOCYTESUR NEGATIVE 12/03/2022 1700    Sepsis Labs: Lactic Acid, Venous No results found for: "LATICACIDVEN"  MICROBIOLOGY: No results found for this or any previous visit (from the past 240 hour(s)).  RADIOLOGY STUDIES/RESULTS: CT Angio Chest/Abd/Pel for Dissection W and/or Wo Contrast  Result Date: 04/07/2023 CLINICAL DATA:  Severe left chest pain radiating to the neck EXAM: CT ANGIOGRAPHY  CHEST, ABDOMEN AND PELVIS TECHNIQUE: Non-contrast CT of the chest was initially obtained. Multidetector CT imaging through the chest, abdomen and pelvis was performed using the standard protocol during bolus administration of intravenous contrast. Multiplanar reconstructed images and MIPs were obtained and reviewed to evaluate the vascular anatomy. RADIATION DOSE REDUCTION: This exam was performed according to the departmental dose-optimization program which includes automated exposure control, adjustment of the mA and/or kV according to patient size and/or use of iterative reconstruction technique. CONTRAST:  OMNIPAQUE IOHEXOL 350 MG/ML SOLN COMPARISON:  Chest CT 03/17/2023, 04/07/2023 radiograph, CT 12/03/2022 FINDINGS: CTA CHEST FINDINGS Cardiovascular: Non contrasted images of the chest demonstrate no acute intramural hematoma. Negative for aneurysm or acute aortic dissection. Normal cardiac size. No pericardial effusion Mediastinum/Nodes: Midline trachea. No thyroid mass. Subcentimeter mediastinal lymph nodes. Esophagus within normal limits. Lungs/Pleura: Small left-sided pleural effusion with adjacent atelectasis  at the left base. Negative for pneumothorax. Right apical scarring with calcification. Musculoskeletal: No acute or suspicious osseous abnormality. Review of the MIP images confirms the above findings. CTA ABDOMEN AND PELVIS FINDINGS VASCULAR Aorta: Normal caliber aorta without aneurysm, dissection, vasculitis or significant stenosis. Celiac: Patent without evidence of aneurysm, dissection, vasculitis or significant stenosis. SMA: Patent without evidence of aneurysm, dissection, vasculitis or significant stenosis. Renals: 2 right and 2 left renal arteries are patent without evidence of aneurysm, dissection, vasculitis, fibromuscular dysplasia or significant stenosis. IMA: Patent without evidence of aneurysm, dissection, vasculitis or significant stenosis. Inflow: Patent without evidence of  aneurysm, dissection, vasculitis or significant stenosis. Veins: No obvious venous abnormality within the limitations of this arterial phase study. Review of the MIP images confirms the above findings. NON-VASCULAR Hepatobiliary: No focal liver abnormality is seen. No gallstones, gallbladder wall thickening, or biliary dilatation. Pancreas: Mild pancreatic head calcifications felt consistent with chronic pancreatitis. Minimal stranding adjacent to the pancreatic tail likely recent or mild pancreatitis. Fluid collection at the pancreatic tail, extending towards the splenic hilus, this measures 4 by 2.6 cm. Spleen: Interim development large Peri splenic fluid collection. Subcapsular slightly dense splenic hematoma measuring up to 2.9 cm in thickness. No extravasation to suggest active bleeding at this time. Adrenals/Urinary Tract: Adrenal glands are within normal limits. Kidneys show no hydronephrosis. The bladder is unremarkable Stomach/Bowel: The stomach is nonenlarged. There is no dilated small bowel. Negative appendix. No acute bowel wall thickening Lymphatic: No suspicious lymph node Reproductive: Negative prostate Other: No free air. Rim enhancing fluid collections in the left upper quadrant, for example 4.6 by 2.3 cm collection on series 302, image 110. This is contiguous with fluid extending along the gastric fundus, this collection measures 7 by 2.6 cm on coronal series 601, image 111. Multiple additional smaller fluid collections. Musculoskeletal: No acute or suspicious osseous abnormality. Review of the MIP images confirms the above findings. IMPRESSION: 1. Negative for acute aortic dissection or aneurysm. No significant vascular disease within the abdomen or pelvis 2. Interim development of subcapsular splenic hematoma measuring up to 2.9 cm in thickness. New large perisplenic slightly dense fluid collection suggestive of contained rupture. No extravasation of contrast to suggest active bleeding on this  exam. 3. Findings suspicious for mild acute on chronic distal pancreatitis. Multiple rim enhancing fluid collections in the left upper quadrant, largest contiguous with fluid extending along the gastric fundus, measuring up to 7 cm. These probably represent pseudocyst given findings of mild pancreatic inflammatory change and additional fluid collection at the tail of pancreas. 4. Small left-sided pleural effusion with adjacent atelectasis at the left base. Electronically Signed   By: Jasmine Pang M.D.   On: 04/07/2023 16:19   DG Chest Portable 1 View  Result Date: 04/07/2023 CLINICAL DATA:  Chest pain and shortness of breath. EXAM: PORTABLE CHEST 1 VIEW COMPARISON:  Chest x-ray 03/17/2023.  Chest CT 03/17/2023. FINDINGS: There are findings suspicious for tiny right apical pneumothorax. Biapical calcifications are again noted. Left pleural effusion has resolved. Left basilar atelectasis persists. There is no mediastinal shift. The cardiomediastinal silhouette is within limits. No acute fractures are seen. Questionable IMPRESSION: 1. Findings suspicious for tiny right apical pneumothorax. 2. Left pleural effusion has resolved. Left basilar atelectasis persists. These results were called by telephone at the time of interpretation on 04/07/2023 at 3:17 pm to provider Dr. Gayleen Orem, who verbally acknowledged these results. Electronically Signed   By: Darliss Cheney M.D.   On: 04/07/2023 15:18  LOS: 1 day   Jeoffrey Massed, MD  [facility]    To contact the attending provider between 7A-7P or the covering provider during after hours 7P-7A, please log into the web site [email or web address] and access using universal [place] password for that web site. If you do not have the password, please call the hospital operator.  04/08/2023, 11:01 AM

## 2023-04-08 NOTE — Consult Note (Addendum)
Reason for Consult/Chief Complaint: splenic hematoma Consultant: [name], MD  Samuel Abbott is an 62 y.o. male.   HPI: 83M with 2 week history of L shoulder and neck pain that progressed to LUQ abdominal pain. Has been tolerating a diet. Was taking 1200mg  of ibuprofen q2h to attempt to control his pain. Was seen at [facility] and underwent CT chest. Took a short course of abx and steroids prescribed by PCP [name]. Smokes 1.5ppd. Drinks 3-4 12oz beers every day.   Past Medical History:  Diagnosis Date   Acute pancreatitis    Alcohol abuse    Eczema    High cholesterol    Hypertension    Tobacco abuse     Past Surgical History:  Procedure Laterality Date   WRIST SURGERY      Family History  Problem Relation Age of Onset   CVA Father     Social History:  reports that he has been smoking cigarettes. He has a 60 pack-year smoking history. He uses smokeless tobacco. He reports current alcohol use of about 56.0 standard drinks of alcohol per week. He reports that he does not currently use drugs.  Allergies:  Allergies  Allergen Reactions   Amoxicillin Anaphylaxis   Penicillins Anaphylaxis    Medications: I have reviewed the patient's current medications.  Results for orders placed or performed during the hospital encounter of 04/07/23 (from the past 48 hour(s))  Basic metabolic panel     Status: Abnormal   Collection Time: 04/07/23 12:43 PM  Result Value Ref Range   Sodium 132 (L) 135 - 145 mmol/L   Potassium 4.0 3.5 - 5.1 mmol/L   Chloride 96 (L) 98 - 111 mmol/L   CO2 22 22 - 32 mmol/L   Glucose, Bld 131 (H) 70 - 99 mg/dL    Comment: Glucose reference range applies only to samples taken after fasting for at least 8 hours.   BUN 23 (H) 6 - 20 mg/dL   Creatinine, Ser 1.30 (H) 0.61 - 1.24 mg/dL   Calcium 9.2 8.9 - 86.5 mg/dL   GFR, Estimated >78 >46 mL/min    Comment: (NOTE) Calculated using the CKD-EPI Creatinine Equation (2021)    Anion gap 14 5 - 15     Comment: Performed at [facility], [address] [place], [place] [address]  CBC     Status: Abnormal   Collection Time: 04/07/23 12:43 PM  Result Value Ref Range   WBC 17.5 (H) 4.0 - 10.5 K/uL   RBC 3.56 (L) 4.22 - 5.81 MIL/uL   Hemoglobin 11.0 (L) 13.0 - 17.0 g/dL   HCT 28.4 (L) 13.2 - 44.0 %   MCV 91.6 80.0 - 100.0 fL   MCH 30.9 26.0 - 34.0 pg   MCHC 33.7 30.0 - 36.0 g/dL   RDW 10.2 72.5 - 36.6 %   Platelets 258 150 - 400 K/uL   nRBC 0.0 0.0 - 0.2 %    Comment: Performed at [facility], [address] [place], [place] [address]  Troponin I (High Sensitivity)     Status: None   Collection Time: 04/07/23 12:43 PM  Result Value Ref Range   Troponin I (High Sensitivity) 3 <18 ng/L    Comment: (NOTE) Elevated high sensitivity troponin I (hsTnI) values and significant  changes across serial measurements may suggest ACS but many other  chronic and acute conditions are known to elevate hsTnI results.  Refer to the "Links"  section for chest pain algorithms and additional  guidance. Performed at [facility], [address] [place], [place] [address]   Hepatic function panel     Status: None   Collection Time: 04/07/23 12:43 PM  Result Value Ref Range   Total Protein 8.1 6.5 - 8.1 g/dL   Albumin 3.7 3.5 - 5.0 g/dL   AST 23 15 - 41 U/L   ALT 22 0 - 44 U/L   Alkaline Phosphatase 114 38 - 126 U/L   Total Bilirubin 1.1 0.3 - 1.2 mg/dL   Bilirubin, Direct 0.2 0.0 - 0.2 mg/dL   Indirect Bilirubin 0.9 0.3 - 0.9 mg/dL    Comment: Performed at [facility], [address] [place], [place] [address]  Lipase, blood     Status: Abnormal   Collection Time: 04/07/23 12:43 PM  Result Value Ref Range   Lipase 92 (H) 11 - 51 U/L    Comment: Performed at [facility], [address] [place], [place] [address]  Hemoglobin and hematocrit, blood     Status: Abnormal   Collection Time: 04/07/23  9:24 PM  Result Value Ref Range    Hemoglobin 9.6 (L) 13.0 - 17.0 g/dL   HCT 91.4 (L) 78.2 - 95.6 %    Comment: Performed at [facility], [address], [place], [place] [address]  Glucose, capillary     Status: Abnormal   Collection Time: 04/07/23 10:47 PM  Result Value Ref Range   Glucose-Capillary 134 (H) 70 - 99 mg/dL    Comment: Glucose reference range applies only to samples taken after fasting for at least 8 hours.    CT Angio Chest/Abd/Pel for Dissection W and/or Wo Contrast  Result Date: 04/07/2023 CLINICAL DATA:  Severe left chest pain radiating to the neck EXAM: CT ANGIOGRAPHY CHEST, ABDOMEN AND PELVIS TECHNIQUE: Non-contrast CT of the chest was initially obtained. Multidetector CT imaging through the chest, abdomen and pelvis was performed using the standard protocol during bolus administration of intravenous contrast. Multiplanar reconstructed images and MIPs were obtained and reviewed to evaluate the vascular anatomy. RADIATION DOSE REDUCTION: This exam was performed according to the departmental dose-optimization program which includes automated exposure control, adjustment of the mA and/or kV according to patient size and/or use of iterative reconstruction technique. CONTRAST:  OMNIPAQUE IOHEXOL 350 MG/ML SOLN COMPARISON:  Chest CT 03/17/2023, 04/07/2023 radiograph, CT 12/03/2022 FINDINGS: CTA CHEST FINDINGS Cardiovascular: Non contrasted images of the chest demonstrate no acute intramural hematoma. Negative for aneurysm or acute aortic dissection. Normal cardiac size. No pericardial effusion Mediastinum/Nodes: Midline trachea. No thyroid mass. Subcentimeter mediastinal lymph nodes. Esophagus within normal limits. Lungs/Pleura: Small left-sided pleural effusion with adjacent atelectasis at the left base. Negative for pneumothorax. Right apical scarring with calcification. Musculoskeletal: No acute or suspicious osseous abnormality. Review of the MIP images confirms the above findings. CTA ABDOMEN AND PELVIS  FINDINGS VASCULAR Aorta: Normal caliber aorta without aneurysm, dissection, vasculitis or significant stenosis. Celiac: Patent without evidence of aneurysm, dissection, vasculitis or significant stenosis. SMA: Patent without evidence of aneurysm, dissection, vasculitis or significant stenosis. Renals: 2 right and 2 left renal arteries are patent without evidence of aneurysm, dissection, vasculitis, fibromuscular dysplasia or significant stenosis. IMA: Patent without evidence of aneurysm, dissection, vasculitis or significant stenosis. Inflow: Patent without evidence of aneurysm, dissection, vasculitis or significant stenosis. Veins: No obvious venous abnormality within the limitations of this arterial phase study. Review of the MIP images confirms  the above findings. NON-VASCULAR Hepatobiliary: No focal liver abnormality is seen. No gallstones, gallbladder wall thickening, or biliary dilatation. Pancreas: Mild pancreatic head calcifications felt consistent with chronic pancreatitis. Minimal stranding adjacent to the pancreatic tail likely recent or mild pancreatitis. Fluid collection at the pancreatic tail, extending towards the splenic hilus, this measures 4 by 2.6 cm. Spleen: Interim development large Peri splenic fluid collection. Subcapsular slightly dense splenic hematoma measuring up to 2.9 cm in thickness. No extravasation to suggest active bleeding at this time. Adrenals/Urinary Tract: Adrenal glands are within normal limits. Kidneys show no hydronephrosis. The bladder is unremarkable Stomach/Bowel: The stomach is nonenlarged. There is no dilated small bowel. Negative appendix. No acute bowel wall thickening Lymphatic: No suspicious lymph node Reproductive: Negative prostate Other: No free air. Rim enhancing fluid collections in the left upper quadrant, for example 4.6 by 2.3 cm collection on series 302, image 110. This is contiguous with fluid extending along the gastric fundus, this collection measures 7  by 2.6 cm on coronal series 601, image 111. Multiple additional smaller fluid collections. Musculoskeletal: No acute or suspicious osseous abnormality. Review of the MIP images confirms the above findings. IMPRESSION: 1. Negative for acute aortic dissection or aneurysm. No significant vascular disease within the abdomen or pelvis 2. Interim development of subcapsular splenic hematoma measuring up to 2.9 cm in thickness. New large perisplenic slightly dense fluid collection suggestive of contained rupture. No extravasation of contrast to suggest active bleeding on this exam. 3. Findings suspicious for mild acute on chronic distal pancreatitis. Multiple rim enhancing fluid collections in the left upper quadrant, largest contiguous with fluid extending along the gastric fundus, measuring up to 7 cm. These probably represent pseudocyst given findings of mild pancreatic inflammatory change and additional fluid collection at the tail of pancreas. 4. Small left-sided pleural effusion with adjacent atelectasis at the left base. Electronically Signed   By: Jasmine Pang M.D.   On: 04/07/2023 16:19   DG Chest Portable 1 View  Result Date: 04/07/2023 CLINICAL DATA:  Chest pain and shortness of breath. EXAM: PORTABLE CHEST 1 VIEW COMPARISON:  Chest x-ray 03/17/2023.  Chest CT 03/17/2023. FINDINGS: There are findings suspicious for tiny right apical pneumothorax. Biapical calcifications are again noted. Left pleural effusion has resolved. Left basilar atelectasis persists. There is no mediastinal shift. The cardiomediastinal silhouette is within limits. No acute fractures are seen. Questionable IMPRESSION: 1. Findings suspicious for tiny right apical pneumothorax. 2. Left pleural effusion has resolved. Left basilar atelectasis persists. These results were called by telephone at the time of interpretation on 04/07/2023 at 3:17 pm to provider Dr. Gayleen Orem, who verbally acknowledged these results. Electronically Signed   By: Darliss Cheney M.D.   On: 04/07/2023 15:18    ROS 10 point review of systems is negative except as listed above in HPI.   Physical Exam Blood pressure 128/73, pulse 81, temperature 98.7 F (37.1 C), temperature source Oral, resp. rate 16, height 6' (1.829 m), weight 75.8 kg, SpO2 99%. Constitutional: well-developed, well-nourished HEENT: pupils equal, round, reactive to light, 2mm b/l, moist conjunctiva, external inspection of ears and nose normal, hearing intact Oropharynx: normal oropharyngeal mucosa, normal dentition Neck: no thyromegaly, trachea midline, no midline cervical tenderness to palpation Chest: breath sounds equal bilaterally, normal respiratory effort, no midline or lateral chest wall tenderness to palpation/deformity Abdomen: soft, LUQ TTP, no bruising, no hepatosplenomegaly Back: no wounds, no thoracic/lumbar spine tenderness to palpation, no thoracic/lumbar spine stepoffs Extremities: 2+ radial and pedal pulses bilaterally, intact motor  and sensation bilateral UE and LE, no peripheral edema MSK: normal gait/station, no clubbing/cyanosis of fingers/toes, normal ROM of all four extremities Skin: warm, dry, no rashes Psych: normal memory, normal mood/affect     Assessment/Plan: Spontaneous splenic laceration  Spontaneous splenic laceration - hemoglobin 11 on admission recheck this AM. Vitals are normal, suspect he can be managed non-operatively. Pain regimen adjusted.  Substance abuse history (tobacco, alcohol) - nicotine patch ordered, declines beer while admitted, would recommend CIWA to avoid withdrawal.  Pancreatic pseudocyst - GI to consult later today Questionable adherence to therapy/recommendations - patient is aggressively asking about discharge today and we discussed that this has a near zero likelihood of occurrence. He requested dilaudid twice while I was in the room and we discussed that if IV pain medication is required, he would not be a candidate for discharge. He  was firm that the plan described was "doing nothing for him" and we discussed the option of leaving AMA. He is considering this.  FEN - regular diet DVT - SCDs, hold chemical ppx due to bleeding concerns Dispo -  5W      Diamantina Monks, MD General and Trauma Surgery [facility] Surgery

## 2023-04-08 NOTE — Plan of Care (Signed)
Labs stable and vital signs stable.

## 2023-04-08 NOTE — Progress Notes (Signed)
   04/08/23 1108  TOC Brief Assessment  Insurance and Status Reviewed ([employer] NAP)  Patient has primary care physician Yes Erenest Rasher, Suzan Slick, [place])  Home environment has been reviewed from home  Prior level of function: independent  Prior/Current Home Services No current home services  Social Determinants of Health Reivew SDOH reviewed interventions complete (Smoking cessation)  Readmission risk has been reviewed Yes (11%)  Transition of care needs no transition of care needs at this time   [facility] will continue to follow patient for any additional discharge needs

## 2023-04-09 ENCOUNTER — Telehealth (HOSPITAL_COMMUNITY): Payer: Self-pay | Admitting: Pharmacy Technician

## 2023-04-09 ENCOUNTER — Other Ambulatory Visit (HOSPITAL_COMMUNITY): Payer: Self-pay

## 2023-04-09 DIAGNOSIS — K862 Cyst of pancreas: Principal | ICD-10-CM

## 2023-04-09 DIAGNOSIS — K861 Other chronic pancreatitis: Secondary | ICD-10-CM | POA: Diagnosis not present

## 2023-04-09 DIAGNOSIS — K860 Alcohol-induced chronic pancreatitis: Secondary | ICD-10-CM | POA: Diagnosis not present

## 2023-04-09 DIAGNOSIS — S3609XA Other injury of spleen, initial encounter: Secondary | ICD-10-CM | POA: Diagnosis not present

## 2023-04-09 DIAGNOSIS — I10 Essential (primary) hypertension: Secondary | ICD-10-CM | POA: Diagnosis not present

## 2023-04-09 DIAGNOSIS — F1010 Alcohol abuse, uncomplicated: Secondary | ICD-10-CM | POA: Diagnosis not present

## 2023-04-09 LAB — CBC
HCT: 27.7 % — ABNORMAL LOW (ref 39.0–52.0)
Hemoglobin: 9.2 g/dL — ABNORMAL LOW (ref 13.0–17.0)
MCH: 30.7 pg (ref 26.0–34.0)
MCHC: 33.2 g/dL (ref 30.0–36.0)
MCV: 92.3 fL (ref 80.0–100.0)
Platelets: 258 10*3/uL (ref 150–400)
RBC: 3 MIL/uL — ABNORMAL LOW (ref 4.22–5.81)
RDW: 12.7 % (ref 11.5–15.5)
WBC: 13.4 10*3/uL — ABNORMAL HIGH (ref 4.0–10.5)
nRBC: 0 % (ref 0.0–0.2)

## 2023-04-09 LAB — GLUCOSE, CAPILLARY
Glucose-Capillary: 104 mg/dL — ABNORMAL HIGH (ref 70–99)
Glucose-Capillary: 102 mg/dL — ABNORMAL HIGH (ref 70–99)

## 2023-04-09 MED ORDER — SENNOSIDES-DOCUSATE SODIUM 8.6-50 MG PO TABS: 2.0000 | ORAL_TABLET | Freq: Every day | ORAL | Status: DC

## 2023-04-09 MED ORDER — OXYCODONE-ACETAMINOPHEN 5-325 MG PO TABS: 1.0000 | ORAL_TABLET | Freq: Four times a day (QID) | ORAL | 0 refills | Status: DC | PRN

## 2023-04-09 MED ORDER — POLYETHYLENE GLYCOL 3350 17 G PO PACK: 17.0000 g | PACK | Freq: Every day | ORAL | Status: DC

## 2023-04-09 MED ORDER — ADULT MULTIVITAMIN W/MINERALS CH
1.0000 | ORAL_TABLET | Freq: Every day | ORAL | Status: DC
  Administered 2023-04-09: 1 via ORAL
  Filled 2023-04-09: qty 1

## 2023-04-09 MED ORDER — POLYETHYLENE GLYCOL 3350 17 G PO PACK: 17.0000 g | PACK | Freq: Every day | ORAL | 0 refills | Status: DC

## 2023-04-09 MED ORDER — SENNOSIDES-DOCUSATE SODIUM 8.6-50 MG PO TABS: 2.0000 | ORAL_TABLET | Freq: Every day | ORAL | 0 refills | Status: DC

## 2023-04-09 NOTE — Telephone Encounter (Signed)
Pharmacy Patient Advocate Encounter  Received notification from [facility] that Prior Authorization for oxyCODONE-Acetaminophen 5-325MG  tablets has been APPROVED from 04/09/2023 to 05/09/2023   PA #/Case ID/Reference #: [id number] Key: [id number]

## 2023-04-09 NOTE — Plan of Care (Signed)
Problem: Education: Goal: Knowledge of General Education information will improve Description: Including pain rating scale, medication(s)/side effects and non-pharmacologic comfort measures Outcome: Progressing   Problem: Clinical Measurements: Goal: Ability to maintain clinical measurements within normal limits will improve Outcome: Progressing Goal: Diagnostic test results will improve Outcome: Progressing Goal: Respiratory complications will improve Outcome: Progressing Goal: Cardiovascular complication will be avoided Outcome: Progressing   Problem: Activity: Goal: Risk for activity intolerance will decrease Outcome: Progressing   Problem: Nutrition: Goal: Adequate nutrition will be maintained Outcome: Progressing   Problem: Coping: Goal: Level of anxiety will decrease Outcome: Progressing

## 2023-04-09 NOTE — TOC Transition Note (Signed)
Transition of Care [facility]) - CM/SW Discharge Note   Patient Details  Name: BRYLIN STANISLAWSKI MRN: [id number] Date of Birth: [birth year]  Transition of Care [facility]) CM/SW Contact:  Gordy Clement, RN Phone Number: 04/09/2023, 11:55 AM   Clinical Narrative:     Patient to dc to home today  No TOC needs identified  Family to transport          Patient Goals and CMS Choice      Discharge Placement                         Discharge Plan and Services Additional resources added to the After Visit Summary for                                       Social Determinants of Health (SDOH) Interventions SDOH Screenings   Tobacco Use: High Risk (03/23/2023)     Readmission Risk Interventions     No data to display

## 2023-04-09 NOTE — Plan of Care (Signed)

## 2023-04-09 NOTE — Progress Notes (Signed)
Patient ID: Samuel Abbott, male   DOB: [birth year], 62 y.o.   MRN: [id number] [facility] Surgery Progress Note     Subjective: CC-  Up in chair. Continues to have left sided abdominal pain radiating into his shoulder/neck. No n/v. Tolerating diet. Passing flatus, no BM. CBC pending. VSS without hypotension or tachycardia.  Objective: Vital signs in last 24 hours: Temp:  [98.1 F (36.7 C)-98.8 F (37.1 C)] 98.8 F (37.1 C) (09/27 0824) Pulse Rate:  [64-92] 92 (09/27 0824) Resp:  [17-20] 20 (09/27 0824) BP: (98-138)/(68-80) 124/75 (09/27 0824) SpO2:  [87 %-99 %] 94 % (09/27 0824) Last BM Date : 04/07/23  Intake/Output from previous day: 09/26 0701 - 09/27 0700 In: 520 [P.O.:520] Out: -  Intake/Output this shift: No intake/output data recorded.  PE: Gen:  Alert, NAD Abd: soft, LUQ TTP, no bruising, no hepatosplenomegaly   Lab Results:  Recent Labs    04/07/23 1243 04/07/23 2124 04/08/23 0609  WBC 17.5*  --  17.7*  HGB 11.0* 9.6* 9.6*  HCT 32.6* 28.4* 29.1*  PLT 258  --  253   BMET Recent Labs    04/07/23 1243 04/08/23 0609  NA 132* 130*  K 4.0 4.1  CL 96* 96*  CO2 22 23  GLUCOSE 131* 154*  BUN 23* 19  CREATININE 1.25* 0.93  CALCIUM 9.2 9.0   PT/INR No results for input(s): "LABPROT", "INR" in the last 72 hours. CMP     Component Value Date/Time   NA 130 (L) 04/08/2023 0609   K 4.1 04/08/2023 0609   CL 96 (L) 04/08/2023 0609   CO2 23 04/08/2023 0609   GLUCOSE 154 (H) 04/08/2023 0609   BUN 19 04/08/2023 0609   CREATININE 0.93 04/08/2023 0609   CALCIUM 9.0 04/08/2023 0609   PROT 8.1 04/07/2023 1243   ALBUMIN 3.7 04/07/2023 1243   AST 23 04/07/2023 1243   ALT 22 04/07/2023 1243   ALKPHOS 114 04/07/2023 1243   BILITOT 1.1 04/07/2023 1243   GFRNONAA >60 04/08/2023 0609   GFRAA >60 09/10/2018 0401   Lipase     Component Value Date/Time   LIPASE 92 (H) 04/07/2023 1243       Studies/Results: CT Angio Chest/Abd/Pel for Dissection W  and/or Wo Contrast  Result Date: 04/07/2023 CLINICAL DATA:  Severe left chest pain radiating to the neck EXAM: CT ANGIOGRAPHY CHEST, ABDOMEN AND PELVIS TECHNIQUE: Non-contrast CT of the chest was initially obtained. Multidetector CT imaging through the chest, abdomen and pelvis was performed using the standard protocol during bolus administration of intravenous contrast. Multiplanar reconstructed images and MIPs were obtained and reviewed to evaluate the vascular anatomy. RADIATION DOSE REDUCTION: This exam was performed according to the departmental dose-optimization program which includes automated exposure control, adjustment of the mA and/or kV according to patient size and/or use of iterative reconstruction technique. CONTRAST:  OMNIPAQUE IOHEXOL 350 MG/ML SOLN COMPARISON:  Chest CT 03/17/2023, 04/07/2023 radiograph, CT 12/03/2022 FINDINGS: CTA CHEST FINDINGS Cardiovascular: Non contrasted images of the chest demonstrate no acute intramural hematoma. Negative for aneurysm or acute aortic dissection. Normal cardiac size. No pericardial effusion Mediastinum/Nodes: Midline trachea. No thyroid mass. Subcentimeter mediastinal lymph nodes. Esophagus within normal limits. Lungs/Pleura: Small left-sided pleural effusion with adjacent atelectasis at the left base. Negative for pneumothorax. Right apical scarring with calcification. Musculoskeletal: No acute or suspicious osseous abnormality. Review of the MIP images confirms the above findings. CTA ABDOMEN AND PELVIS FINDINGS VASCULAR Aorta: Normal caliber aorta without aneurysm, dissection, vasculitis or significant  stenosis. Celiac: Patent without evidence of aneurysm, dissection, vasculitis or significant stenosis. SMA: Patent without evidence of aneurysm, dissection, vasculitis or significant stenosis. Renals: 2 right and 2 left renal arteries are patent without evidence of aneurysm, dissection, vasculitis, fibromuscular dysplasia or significant stenosis.  IMA: Patent without evidence of aneurysm, dissection, vasculitis or significant stenosis. Inflow: Patent without evidence of aneurysm, dissection, vasculitis or significant stenosis. Veins: No obvious venous abnormality within the limitations of this arterial phase study. Review of the MIP images confirms the above findings. NON-VASCULAR Hepatobiliary: No focal liver abnormality is seen. No gallstones, gallbladder wall thickening, or biliary dilatation. Pancreas: Mild pancreatic head calcifications felt consistent with chronic pancreatitis. Minimal stranding adjacent to the pancreatic tail likely recent or mild pancreatitis. Fluid collection at the pancreatic tail, extending towards the splenic hilus, this measures 4 by 2.6 cm. Spleen: Interim development large Peri splenic fluid collection. Subcapsular slightly dense splenic hematoma measuring up to 2.9 cm in thickness. No extravasation to suggest active bleeding at this time. Adrenals/Urinary Tract: Adrenal glands are within normal limits. Kidneys show no hydronephrosis. The bladder is unremarkable Stomach/Bowel: The stomach is nonenlarged. There is no dilated small bowel. Negative appendix. No acute bowel wall thickening Lymphatic: No suspicious lymph node Reproductive: Negative prostate Other: No free air. Rim enhancing fluid collections in the left upper quadrant, for example 4.6 by 2.3 cm collection on series 302, image 110. This is contiguous with fluid extending along the gastric fundus, this collection measures 7 by 2.6 cm on coronal series 601, image 111. Multiple additional smaller fluid collections. Musculoskeletal: No acute or suspicious osseous abnormality. Review of the MIP images confirms the above findings. IMPRESSION: 1. Negative for acute aortic dissection or aneurysm. No significant vascular disease within the abdomen or pelvis 2. Interim development of subcapsular splenic hematoma measuring up to 2.9 cm in thickness. New large perisplenic  slightly dense fluid collection suggestive of contained rupture. No extravasation of contrast to suggest active bleeding on this exam. 3. Findings suspicious for mild acute on chronic distal pancreatitis. Multiple rim enhancing fluid collections in the left upper quadrant, largest contiguous with fluid extending along the gastric fundus, measuring up to 7 cm. These probably represent pseudocyst given findings of mild pancreatic inflammatory change and additional fluid collection at the tail of pancreas. 4. Small left-sided pleural effusion with adjacent atelectasis at the left base. Electronically Signed   By: Jasmine Pang M.D.   On: 04/07/2023 16:19   DG Chest Portable 1 View  Result Date: 04/07/2023 CLINICAL DATA:  Chest pain and shortness of breath. EXAM: PORTABLE CHEST 1 VIEW COMPARISON:  Chest x-ray 03/17/2023.  Chest CT 03/17/2023. FINDINGS: There are findings suspicious for tiny right apical pneumothorax. Biapical calcifications are again noted. Left pleural effusion has resolved. Left basilar atelectasis persists. There is no mediastinal shift. The cardiomediastinal silhouette is within limits. No acute fractures are seen. Questionable IMPRESSION: 1. Findings suspicious for tiny right apical pneumothorax. 2. Left pleural effusion has resolved. Left basilar atelectasis persists. These results were called by telephone at the time of interpretation on 04/07/2023 at 3:17 pm to provider Dr. Gayleen Orem, who verbally acknowledged these results. Electronically Signed   By: Darliss Cheney M.D.   On: 04/07/2023 15:18    Anti-infectives: Anti-infectives (From admission, onward)    None        Assessment/Plan Spontaneous splenic laceration   Spontaneous splenic laceration - Labs pending this morning, but h/h stable on last check. Vitals are normal. Continue non-operative management. If h/h stable  he would be ok for discharge from surgical standpoint with PCP follow up. Discussed avoiding activity where he  could be hit in the abdomen for the next month Substance abuse history (tobacco, alcohol) - nicotine patch, declines beer while admitted, would recommend CIWA to avoid withdrawal.  Pancreatic pseudocyst - per GI. They have not formally consulted yet, primary team is reaching out to them for consult vs outpatient follow up FEN - regular diet DVT - SCDs, hold chemical ppx due to bleeding concerns Dispo -  5W     I reviewed hospitalist notes, last 24 h vitals and pain scores, last 48 h intake and output, and last 24 h labs and trends.    LOS: 2 days    Franne Forts, [facility] Surgery 04/09/2023, 9:14 AM Please see Amion for pager number during day hours 7:00am-4:30pm

## 2023-04-09 NOTE — Consult Note (Signed)
Consultation  Referring Provider: Dr.Ghimire   Primary Care Physician:  Norm Salt, PA Primary Gastroenterologist:   Gentry Fitz      Reason for Consultation: Splenic rupture and splenic hematoma as well as pancreatitis and pancreatic pseudocysts             HPI:   Samuel Abbott is a 62 y.o. male with a past medical history as listed below including hypertension, hyperlipidemia, alcohol and tobacco abuse, history of pancreatitis and pancreatic pseudocyst who presented to the ER on 04/07/2023 for evaluation of left lower chest/upper abdominal area pain.    At time of presentation patient described 3 weeks ago he developed left shoulder pain which radiated to his neck and then severe pain in his left upper abdomen to flank area that was persistent and worsening.  He been taking a lot of NSAIDs and attempt for pain control.  Seen in ED 9/4 with a CTA chest negative for PE but did show small left pleural effusion with left base atelectasis and fluid and stranding around the pancreatic tail as well as presumed pancreatic pseudocyst.  Describes drinking 3-4 beers daily.    Today, at time of my interview patient seems anxious, currently tells me the only pain he is having is a severe pain on the left side of his abdomen related to "my spleen", apparently is still on IV Dilaudid but there are plans to possibly discharge him home today with oral pain meds per his report.  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  He tells me that he has never had or been told that he had pseudocyst before and is worried about them and what to do.  Apparently tells me he has had an EGD and colonoscopy before though I cannot see them in his our system.  He thinks they were actually done at [facility].  I cannot see report of this.      Denies any current GI issues, no nausea,  vomiting, heartburn, reflux or change in bowel habits.  ER course: CTA chest/abdomen/pelvis was negative for acute aortic dissection or aneurysm, interval development of subscapular splenic hematoma measuring up to 2.9 cm in thickness, new large perisplenic slightly tense fluid collection suggestive of contained rupture, no active bleeding, suspicious for mild acute on chronic distal pancreatitis, multiple rim-enhancing fluid collections in the left upper quadrant, the largest contiguous with fluid extending along the gastric fundus and measuring up to 7 cm, probable pseudocyst given findings of mild pancreatic inflammatory change, small left-sided pleural effusion with adjacent atelectasis  Past Medical History:  Diagnosis Date   Acute pancreatitis    Alcohol abuse    Eczema    High cholesterol    Hypertension    Tobacco abuse     Past Surgical History:  Procedure Laterality Date   WRIST SURGERY      Family History  Problem Relation Age of Onset   CVA Father     Social History   Tobacco Use   Smoking status: Every Day    Current packs/day: 1.50    Average packs/day: 1.5 packs/day for 40.0 years (60.0 ttl pk-yrs)    Types: Cigarettes   Smokeless tobacco: Current  Substance Use Topics   Alcohol use: Yes    Alcohol/week: 56.0 standard drinks of alcohol    Types: 56  Shots of liquor per week    Comment: Beer Daily now   Drug use: Not Currently    Prior to Admission medications   Medication Sig Start Date End Date Taking? Authorizing Provider  atorvastatin (LIPITOR) 40 MG tablet Take 1 tablet (40 mg total) by mouth daily. 01/25/21  Yes Valetta Close, MD  DULoxetine (CYMBALTA) 20 MG capsule Take 20 mg by mouth 2 (two) times daily. 01/13/21  Yes [provider]  hydrOXYzine (VISTARIL) 25 MG capsule Take 25 mg by mouth at bedtime. 11/29/20  Yes [provider]  losartan (COZAAR) 25 MG tablet Take 25 mg by mouth daily. 09/16/20  Yes [provider]   metFORMIN (GLUCOPHAGE) 500 MG tablet Take 1 tablet (500 mg total) by mouth daily with breakfast. 01/24/21 04/07/23 Yes Maness, Philip, MD  Multiple Vitamin (MULTIVITAMIN) capsule Take 1 capsule by mouth daily.   Yes [provider]  ondansetron (ZOFRAN-ODT) 4 MG disintegrating tablet Take 1 tablet (4 mg total) by mouth every 8 (eight) hours as needed for nausea or vomiting. 12/03/22  Yes Smoot, Shawn Route, PA-C  oxyCODONE-acetaminophen (PERCOCET/ROXICET) 5-325 MG tablet Take 1 tablet by mouth every 8 (eight) hours as needed for severe pain. 12/03/22  Yes Smoot, Sarah A, PA-C  tiZANidine (ZANAFLEX) 4 MG tablet Take 4 mg by mouth every 8 (eight) hours as needed for muscle spasms. 11/29/20  Yes [provider]  traMADol (ULTRAM) 50 MG tablet Take 1 tablet (50 mg total) by mouth every 6 (six) hours as needed. Patient taking differently: Take 50 mg by mouth every 6 (six) hours as needed for moderate pain. 03/17/23  Yes Cathren Laine, MD  nicotine (NICODERM CQ - DOSED IN MG/24 HOURS) 21 mg/24hr patch Place 1 patch (21 mg total) onto the skin daily. Patient not taking: Reported on 04/07/2023 01/25/21   Valetta Close, MD    Current Facility-Administered Medications  Medication Dose Route Frequency Provider Last Rate Last Admin   acetaminophen (TYLENOL) tablet 1,000 mg  1,000 mg Oral Q8H Ghimire, Shanker M, MD   1,000 mg at 04/08/23 0848   atorvastatin (LIPITOR) tablet 40 mg  40 mg Oral Daily Darreld Mclean R, MD   40 mg at 04/09/23 0835   diphenhydrAMINE (BENADRYL) capsule 25 mg  25 mg Oral Q8H PRN Maretta Bees, MD       DULoxetine (CYMBALTA) DR capsule 20 mg  20 mg Oral BID Darreld Mclean R, MD   20 mg at 04/09/23 0834   HYDROmorphone (DILAUDID) injection 0.5 mg  0.5 mg Intravenous Q3H PRN Maretta Bees, MD   0.5 mg at 04/09/23 0848   hydrOXYzine (ATARAX) tablet 25 mg  25 mg Oral QHS Darreld Mclean R, MD   25 mg at 04/08/23 2102   insulin aspart (novoLOG) injection 0-9 Units  0-9  Units Subcutaneous TID WC Charlsie Quest, MD   5 Units at 04/08/23 1555   methocarbamol (ROBAXIN) tablet 1,000 mg  1,000 mg Oral Q8H Diamantina Monks, MD   1,000 mg at 04/09/23 0545   multivitamin with minerals tablet 1 tablet  1 tablet Oral Daily John Giovanni, MD   1 tablet at 04/09/23 0834   nicotine (NICODERM CQ - dosed in mg/24 hours) patch 21 mg  21 mg Transdermal Daily Darreld Mclean R, MD   21 mg at 04/09/23 0835   ondansetron (ZOFRAN) tablet 4 mg  4 mg Oral Q6H PRN Charlsie Quest, MD   4 mg at 04/09/23 [phone]  Or   ondansetron (ZOFRAN) injection 4 mg  4 mg Intravenous Q6H PRN Charlsie Quest, MD   4 mg at 04/08/23 0848   oxyCODONE (Oxy IR/ROXICODONE) immediate release tablet 10 mg  10 mg Oral Q6H PRN Maretta Bees, MD   10 mg at 04/09/23 0327   senna-docusate (Senokot-S) tablet 1 tablet  1 tablet Oral QHS PRN Charlsie Quest, MD       sodium chloride flush (NS) 0.9 % injection 3 mL  3 mL Intravenous Q12H Charlsie Quest, MD   3 mL at 04/08/23 2103    Allergies as of 04/07/2023 - Review Complete 04/07/2023  Allergen Reaction Noted   Amoxicillin Anaphylaxis 01/22/2021   Penicillins Anaphylaxis 09/08/2018     Review of Systems:    Constitutional: No weight loss, fever or chills Skin: No rash  Cardiovascular: No chest pain  Respiratory: No SOB Gastrointestinal: See HPI and otherwise negative Genitourinary: No dysuria  Neurological: No headache, dizziness or syncope Musculoskeletal: No new muscle or joint pain Hematologic: No bleeding Psychiatric: No history of depression or anxiety    Physical Exam:  Vital signs in last 24 hours: Temp:  [98.1 F (36.7 C)-98.8 F (37.1 C)] 98.8 F (37.1 C) (09/27 0824) Pulse Rate:  [64-92] 92 (09/27 0824) Resp:  [17-20] 20 (09/27 0824) BP: (98-138)/(68-80) 124/75 (09/27 0824) SpO2:  [87 %-99 %] 94 % (09/27 0824) Last BM Date : 04/07/23 General:   Pleasant Caucasian male appears to be in NAD, Well developed, Well nourished,  alert and cooperative Head:  Normocephalic and atraumatic. Eyes:   PEERL, EOMI. No icterus. Conjunctiva pink. Ears:  Normal auditory acuity. Neck:  Supple Throat: Oral cavity and pharynx without inflammation, swelling or lesion. Teeth in good condition. Lungs: Respirations even and unlabored. Lungs clear to auscultation bilaterally.   No wheezes, crackles, or rhonchi.  Heart: Normal S1, S2. No MRG. Regular rate and rhythm. No peripheral edema, cyanosis or pallor.  Abdomen:  Soft, nondistended, + moderate TTP on the left side of the abdomen around the rib cage and into the back with involuntary guarding, normal bowel sounds. No appreciable masses or hepatomegaly. Rectal:  Not performed.  Msk:  Symmetrical without gross deformities. Peripheral pulses intact.  Extremities:  Without edema, no deformity or joint abnormality. Normal ROM, normal sensation. Neurologic:  Alert and  oriented x4;  grossly normal neurologically.  Skin:   Dry and intact without significant lesions or rashes. Psychiatric: Demonstrates good judgement and reason without abnormal affect or behaviors.   LAB RESULTS: Recent Labs    04/07/23 1243 04/07/23 2124 04/08/23 0609  WBC 17.5*  --  17.7*  HGB 11.0* 9.6* 9.6*  HCT 32.6* 28.4* 29.1*  PLT 258  --  253   BMET Recent Labs    04/07/23 1243 04/08/23 0609  NA 132* 130*  K 4.0 4.1  CL 96* 96*  CO2 22 23  GLUCOSE 131* 154*  BUN 23* 19  CREATININE 1.25* 0.93  CALCIUM 9.2 9.0   LFT Recent Labs    04/07/23 1243  PROT 8.1  ALBUMIN 3.7  AST 23  ALT 22  ALKPHOS 114  BILITOT 1.1  BILIDIR 0.2  IBILI 0.9   STUDIES: CT Angio Chest/Abd/Pel for Dissection W and/or Wo Contrast  Result Date: 04/07/2023 CLINICAL DATA:  Severe left chest pain radiating to the neck EXAM: CT ANGIOGRAPHY CHEST, ABDOMEN AND PELVIS TECHNIQUE: Non-contrast CT of the chest was initially obtained. Multidetector CT imaging through the chest, abdomen and pelvis  was performed using the  standard protocol during bolus administration of intravenous contrast. Multiplanar reconstructed images and MIPs were obtained and reviewed to evaluate the vascular anatomy. RADIATION DOSE REDUCTION: This exam was performed according to the departmental dose-optimization program which includes automated exposure control, adjustment of the mA and/or kV according to patient size and/or use of iterative reconstruction technique. CONTRAST:  OMNIPAQUE IOHEXOL 350 MG/ML SOLN COMPARISON:  Chest CT 03/17/2023, 04/07/2023 radiograph, CT 12/03/2022 FINDINGS: CTA CHEST FINDINGS Cardiovascular: Non contrasted images of the chest demonstrate no acute intramural hematoma. Negative for aneurysm or acute aortic dissection. Normal cardiac size. No pericardial effusion Mediastinum/Nodes: Midline trachea. No thyroid mass. Subcentimeter mediastinal lymph nodes. Esophagus within normal limits. Lungs/Pleura: Small left-sided pleural effusion with adjacent atelectasis at the left base. Negative for pneumothorax. Right apical scarring with calcification. Musculoskeletal: No acute or suspicious osseous abnormality. Review of the MIP images confirms the above findings. CTA ABDOMEN AND PELVIS FINDINGS VASCULAR Aorta: Normal caliber aorta without aneurysm, dissection, vasculitis or significant stenosis. Celiac: Patent without evidence of aneurysm, dissection, vasculitis or significant stenosis. SMA: Patent without evidence of aneurysm, dissection, vasculitis or significant stenosis. Renals: 2 right and 2 left renal arteries are patent without evidence of aneurysm, dissection, vasculitis, fibromuscular dysplasia or significant stenosis. IMA: Patent without evidence of aneurysm, dissection, vasculitis or significant stenosis. Inflow: Patent without evidence of aneurysm, dissection, vasculitis or significant stenosis. Veins: No obvious venous abnormality within the limitations of this arterial phase study. Review of the MIP images confirms  the above findings. NON-VASCULAR Hepatobiliary: No focal liver abnormality is seen. No gallstones, gallbladder wall thickening, or biliary dilatation. Pancreas: Mild pancreatic head calcifications felt consistent with chronic pancreatitis. Minimal stranding adjacent to the pancreatic tail likely recent or mild pancreatitis. Fluid collection at the pancreatic tail, extending towards the splenic hilus, this measures 4 by 2.6 cm. Spleen: Interim development large Peri splenic fluid collection. Subcapsular slightly dense splenic hematoma measuring up to 2.9 cm in thickness. No extravasation to suggest active bleeding at this time. Adrenals/Urinary Tract: Adrenal glands are within normal limits. Kidneys show no hydronephrosis. The bladder is unremarkable Stomach/Bowel: The stomach is nonenlarged. There is no dilated small bowel. Negative appendix. No acute bowel wall thickening Lymphatic: No suspicious lymph node Reproductive: Negative prostate Other: No free air. Rim enhancing fluid collections in the left upper quadrant, for example 4.6 by 2.3 cm collection on series 302, image 110. This is contiguous with fluid extending along the gastric fundus, this collection measures 7 by 2.6 cm on coronal series 601, image 111. Multiple additional smaller fluid collections. Musculoskeletal: No acute or suspicious osseous abnormality. Review of the MIP images confirms the above findings. IMPRESSION: 1. Negative for acute aortic dissection or aneurysm. No significant vascular disease within the abdomen or pelvis 2. Interim development of subcapsular splenic hematoma measuring up to 2.9 cm in thickness. New large perisplenic slightly dense fluid collection suggestive of contained rupture. No extravasation of contrast to suggest active bleeding on this exam. 3. Findings suspicious for mild acute on chronic distal pancreatitis. Multiple rim enhancing fluid collections in the left upper quadrant, largest contiguous with fluid extending  along the gastric fundus, measuring up to 7 cm. These probably represent pseudocyst given findings of mild pancreatic inflammatory change and additional fluid collection at the tail of pancreas. 4. Small left-sided pleural effusion with adjacent atelectasis at the left base. Electronically Signed   By: Jasmine Pang M.D.   On: 04/07/2023 16:19   DG Chest Portable 1 View  Result Date: 04/07/2023 CLINICAL DATA:  Chest pain and shortness of breath. EXAM: PORTABLE CHEST 1 VIEW COMPARISON:  Chest x-ray 03/17/2023.  Chest CT 03/17/2023. FINDINGS: There are findings suspicious for tiny right apical pneumothorax. Biapical calcifications are again noted. Left pleural effusion has resolved. Left basilar atelectasis persists. There is no mediastinal shift. The cardiomediastinal silhouette is within limits. No acute fractures are seen. Questionable IMPRESSION: 1. Findings suspicious for tiny right apical pneumothorax. 2. Left pleural effusion has resolved. Left basilar atelectasis persists. These results were called by telephone at the time of interpretation on 04/07/2023 at 3:17 pm to provider Dr. Gayleen Orem, who verbally acknowledged these results. Electronically Signed   By: Darliss Cheney M.D.   On: 04/07/2023 15:18     Impression / Plan:   Impression: 1.  Splenic hematoma with contained rupture: Being followed by surgical team, seen on CT as above, has been tolerating diet, plans per surgical team 2.  Chronic pancreatitis: 7 cm pseudocyst and smaller pseudocyst as in CT above, has had a least 3-4 episodes of alcohol induced pancreatitis over the years since 2020, no nausea, vomiting or epigastric pain 3.  Leukocytosis: Suspected reactive 4.  Alcohol abuse: 3-4 beers daily for many years, somewhat unclear on this history  Plan: 1.  Discussed pseudocyst with the patient, there is no need for intervention currently especially in the setting of acute splenic rupture and hematoma.  He is not actually having any nausea or  vomiting or epigastric pain, most of his pain is related to his spleen. 2.  Will plan for outpatient follow-up with the patient, will repeat CT of the abdomen with pancreatic protocol in 3 months and then have him seen in office so that we can discuss further.  Will try to get him established with Dr. Meridee Score as he is our advanced endoscopy specialist, that way if he needs intervention in the future he is already known to him. 3.  Appreciate plans per surgical team.  We will sign off.  Thank you for your kind consultation.  Violet Baldy [facility]  04/09/2023, 9:18 AM

## 2023-04-09 NOTE — Discharge Summary (Signed)
PATIENT DETAILS Name: Samuel Abbott Age: 62 y.o. Sex: male Date of Birth: [birth year] MRN: [id number]. Admitting Physician: Cathren Harsh, MD [id number], Suzan Slick, [place]  Admit Date: 04/07/2023 Discharge date: 04/09/2023  Recommendations for Outpatient Follow-up:  Follow up with PCP in 1-2 weeks Please obtain CMP/CBC in one week Please ensure follow-up with GI/general surgery  Admitted From:  Home  Disposition: Home   Discharge Condition: good  CODE STATUS:   Code Status: Full Code   Diet recommendation:  Diet Order             Diet - low sodium heart healthy           Diet Carb Modified           DIET SOFT Room service appropriate? Yes; Fluid consistency: Thin  Diet effective now                    Brief Summary: Patient is a 62 y.o.  male with HTN, HLD, EtOH use-who presented with left upper abdominal/left lower chest pain-found to have contained splenic rupture/hematoma and pancreatitis with pseudocyst.    Significant events: 9/25>> admit to [facility]   Significant studies: 9/25>> CT angio chest/abdomen/pelvis: No dissection-subcapsular splenic hematoma-no extravasation-acute on chronic distal pancreatitis-with multiple rim-enhancing fluid collections consistent with pseudocyst.   Significant microbiology data: None   Procedures: None   Consults: Trauma GI  Brief Hospital Course: Contained spontaneous splenic rupture with hematoma Stable-followed by general surgery/trauma service Recommendations are to continue supportive care Hemoglobin stable-okay to discharge from general surgical point of view.     Alcoholic pancreatitis with pseudocysts Pancreatitis appears to be mostly chronic-no significant pain issues. No gallstone was noted on CT abdomen Tolerating diet-really does not have any epigastric pain Appreciate GI input-will be followed in the GI clinic.   DM-2 CBG stable with SSI while inpatient Resume metformin on discharge   HTN BP  stable-resume losartan on discharge.   HLD Statin   Tobacco abuse Transdermal nicotine   EtOH use Last drink this past Friday Normally drinks 3-4 beers daily He has been extensively regarding abstinence from further alcohol use given pancreatitis/pseudocyst-the need for narcotics to control pain from his splenic hematoma.  Extensive counseling-done-RN at bedside-patient is aware of dangers of polypharmacy-especially mixing alcohol and narcotics.  He is aware of the life-threatening and life disabling effects.  Continued abstinence from alcohol was advised.  BMI: Estimated body mass index is 22.65 kg/m as calculated from the following:   Height as of this encounter: 6' (1.829 m).   Weight as of this encounter: 75.8 kg.   Discharge Diagnoses:  Principal Problem:   Closed splenic rupture Active Problems:   Chronic pancreatitis (HCC)   Tobacco abuse   Chronic alcohol abuse   Hyperlipidemia   Pancreatic pseudocyst   Leukocytosis   Essential hypertension   Type 2 diabetes mellitus (HCC)   Discharge Instructions:  Activity:  As tolerated   Discharge Instructions     Call MD for:  persistant nausea and vomiting   Complete by: As directed    Call MD for:  severe uncontrolled pain   Complete by: As directed    Diet - low sodium heart healthy   Complete by: As directed    Diet Carb Modified   Complete by: As directed    Discharge instructions   Complete by: As directed    Follow with Primary MD  Norm Salt, PA in 1-2 weeks  Follow-up with gastroenterology-the  office will call you with a follow-up appointment. Please get a complete blood count and chemistry panel checked by your Primary MD at your next visit, and again as instructed by your Primary MD.  Get Medicines reviewed and adjusted: Please take all your medications with you for your next visit with your Primary MD  Laboratory/radiological data: Please request your Primary MD to go over all hospital  tests and procedure/radiological results at the follow up, please ask your Primary MD to get all Hospital records sent to his/her office.  In some cases, they will be blood work, cultures and biopsy results pending at the time of your discharge. Please request that your primary care M.D. follows up on these results.  Also Note the following: If you experience worsening of your admission symptoms, develop shortness of breath, life threatening emergency, suicidal or homicidal thoughts you must seek medical attention immediately by calling 911 or calling your MD immediately  if symptoms less severe.  You must read complete instructions/literature along with all the possible adverse reactions/side effects for all the Medicines you take and that have been prescribed to you. Take any new Medicines after you have completely understood and accpet all the possible adverse reactions/side effects.   Do not drive when taking Pain medications or sleeping medications (Benzodaizepines)  Do not take more than prescribed Pain, Sleep and Anxiety Medications. It is not advisable to combine anxiety,sleep and pain medications without talking with your primary care practitioner.  It is not advisable to drink alcohol while you are on narcotics-as it may cause a life-threatening and life disabling side effects.  Special Instructions: If you have smoked or chewed Tobacco  in the last 2 yrs please stop smoking, stop any regular Alcohol  and or any Recreational drug use.  Wear Seat belts while driving.  Please note: You were cared for by a hospitalist during your hospital stay. Once you are discharged, your primary care physician will handle any further medical issues. Please note that NO REFILLS for any discharge medications will be authorized once you are discharged, as it is imperative that you return to your primary care physician (or establish a relationship with a primary care physician if you do not have one) for your  post hospital discharge needs so that they can reassess your need for medications and monitor your lab values.   Increase activity slowly   Complete by: As directed       Allergies as of 04/09/2023       Reactions   Amoxicillin Anaphylaxis   Penicillins Anaphylaxis        Medication List     STOP taking these medications    traMADol 50 MG tablet Commonly known as: ULTRAM       TAKE these medications    atorvastatin 40 MG tablet Commonly known as: LIPITOR Take 1 tablet (40 mg total) by mouth daily.   DULoxetine 20 MG capsule Commonly known as: CYMBALTA Take 20 mg by mouth 2 (two) times daily.   hydrOXYzine 25 MG capsule Commonly known as: VISTARIL Take 25 mg by mouth at bedtime.   losartan 25 MG tablet Commonly known as: COZAAR Take 25 mg by mouth daily.   metFORMIN 500 MG tablet Commonly known as: Glucophage Take 1 tablet (500 mg total) by mouth daily with breakfast.   multivitamin capsule Take 1 capsule by mouth daily.   nicotine 21 mg/24hr patch Commonly known as: NICODERM CQ - dosed in mg/24 hours Place 1 patch (21 mg  total) onto the skin daily.   ondansetron 4 MG disintegrating tablet Commonly known as: ZOFRAN-ODT Take 1 tablet (4 mg total) by mouth every 8 (eight) hours as needed for nausea or vomiting.   oxyCODONE-acetaminophen 5-325 MG tablet Commonly known as: PERCOCET/ROXICET Take 1-2 tablets by mouth every 6 (six) hours as needed for severe pain. What changed:  how much to take when to take this   polyethylene glycol 17 g packet Commonly known as: MIRALAX / GLYCOLAX Take 17 g by mouth daily. Start taking on: April 10, 2023   senna-docusate 8.6-50 MG tablet Commonly known as: Senokot-S Take 2 tablets by mouth at bedtime.   tiZANidine 4 MG tablet Commonly known as: ZANAFLEX Take 4 mg by mouth every 8 (eight) hours as needed for muscle spasms.        Follow-up Information     Norm Salt, [place]. Schedule an appointment  as soon as possible for a visit in 2 days.   Specialty: Physician Assistant Contact information: [address] [place] [address] [phone]         [facility] Emergency Department at [facility]   Specialty: Emergency Medicine Why: As needed, If symptoms worsen Contact information: [address] [place] [address] [phone]        Jeani Hawking, MD.   Specialty: Gastroenterology Why: follow up with gastroenterology Contact information: [address] [place] [address] [phone]                Allergies  Allergen Reactions   Amoxicillin Anaphylaxis   Penicillins Anaphylaxis     Other Procedures/Studies: CT Angio Chest/Abd/Pel for Dissection W and/or Wo Contrast  Result Date: 04/07/2023 CLINICAL DATA:  Severe left chest pain radiating to the neck EXAM: CT ANGIOGRAPHY CHEST, ABDOMEN AND PELVIS TECHNIQUE: Non-contrast CT of the chest was initially obtained. Multidetector CT imaging through the chest, abdomen and pelvis was performed using the standard protocol during bolus administration of intravenous contrast. Multiplanar reconstructed images and MIPs were obtained and reviewed to evaluate the vascular anatomy. RADIATION DOSE REDUCTION: This exam was performed according to the departmental dose-optimization program which includes automated exposure control, adjustment of the mA and/or kV according to patient size and/or use of iterative reconstruction technique. CONTRAST:  OMNIPAQUE IOHEXOL 350 MG/ML SOLN COMPARISON:  Chest CT 03/17/2023, 04/07/2023 radiograph, CT 12/03/2022 FINDINGS: CTA CHEST FINDINGS Cardiovascular: Non contrasted images of the chest demonstrate no acute intramural hematoma. Negative for aneurysm or acute aortic dissection. Normal cardiac size. No pericardial effusion Mediastinum/Nodes: Midline trachea. No thyroid mass. Subcentimeter mediastinal lymph nodes. Esophagus within  normal limits. Lungs/Pleura: Small left-sided pleural effusion with adjacent atelectasis at the left base. Negative for pneumothorax. Right apical scarring with calcification. Musculoskeletal: No acute or suspicious osseous abnormality. Review of the MIP images confirms the above findings. CTA ABDOMEN AND PELVIS FINDINGS VASCULAR Aorta: Normal caliber aorta without aneurysm, dissection, vasculitis or significant stenosis. Celiac: Patent without evidence of aneurysm, dissection, vasculitis or significant stenosis. SMA: Patent without evidence of aneurysm, dissection, vasculitis or significant stenosis. Renals: 2 right and 2 left renal arteries are patent without evidence of aneurysm, dissection, vasculitis, fibromuscular dysplasia or significant stenosis. IMA: Patent without evidence of aneurysm, dissection, vasculitis or significant stenosis. Inflow: Patent without evidence of aneurysm, dissection, vasculitis or significant stenosis. Veins: No obvious venous abnormality within the limitations of this arterial phase study. Review of the MIP images confirms the above findings. NON-VASCULAR Hepatobiliary: No focal liver abnormality is seen.  No gallstones, gallbladder wall thickening, or biliary dilatation. Pancreas: Mild pancreatic head calcifications felt consistent with chronic pancreatitis. Minimal stranding adjacent to the pancreatic tail likely recent or mild pancreatitis. Fluid collection at the pancreatic tail, extending towards the splenic hilus, this measures 4 by 2.6 cm. Spleen: Interim development large Peri splenic fluid collection. Subcapsular slightly dense splenic hematoma measuring up to 2.9 cm in thickness. No extravasation to suggest active bleeding at this time. Adrenals/Urinary Tract: Adrenal glands are within normal limits. Kidneys show no hydronephrosis. The bladder is unremarkable Stomach/Bowel: The stomach is nonenlarged. There is no dilated small bowel. Negative appendix. No acute bowel wall  thickening Lymphatic: No suspicious lymph node Reproductive: Negative prostate Other: No free air. Rim enhancing fluid collections in the left upper quadrant, for example 4.6 by 2.3 cm collection on series 302, image 110. This is contiguous with fluid extending along the gastric fundus, this collection measures 7 by 2.6 cm on coronal series 601, image 111. Multiple additional smaller fluid collections. Musculoskeletal: No acute or suspicious osseous abnormality. Review of the MIP images confirms the above findings. IMPRESSION: 1. Negative for acute aortic dissection or aneurysm. No significant vascular disease within the abdomen or pelvis 2. Interim development of subcapsular splenic hematoma measuring up to 2.9 cm in thickness. New large perisplenic slightly dense fluid collection suggestive of contained rupture. No extravasation of contrast to suggest active bleeding on this exam. 3. Findings suspicious for mild acute on chronic distal pancreatitis. Multiple rim enhancing fluid collections in the left upper quadrant, largest contiguous with fluid extending along the gastric fundus, measuring up to 7 cm. These probably represent pseudocyst given findings of mild pancreatic inflammatory change and additional fluid collection at the tail of pancreas. 4. Small left-sided pleural effusion with adjacent atelectasis at the left base. Electronically Signed   By: Jasmine Pang M.D.   On: 04/07/2023 16:19   DG Chest Portable 1 View  Result Date: 04/07/2023 CLINICAL DATA:  Chest pain and shortness of breath. EXAM: PORTABLE CHEST 1 VIEW COMPARISON:  Chest x-ray 03/17/2023.  Chest CT 03/17/2023. FINDINGS: There are findings suspicious for tiny right apical pneumothorax. Biapical calcifications are again noted. Left pleural effusion has resolved. Left basilar atelectasis persists. There is no mediastinal shift. The cardiomediastinal silhouette is within limits. No acute fractures are seen. Questionable IMPRESSION: 1.  Findings suspicious for tiny right apical pneumothorax. 2. Left pleural effusion has resolved. Left basilar atelectasis persists. These results were called by telephone at the time of interpretation on 04/07/2023 at 3:17 pm to provider Dr. Gayleen Orem, who verbally acknowledged these results. Electronically Signed   By: Darliss Cheney M.D.   On: 04/07/2023 15:18   CT Angio Chest PE W/Cm &/Or Wo Cm  Result Date: 03/17/2023 CLINICAL DATA:  Pulmonary embolism (PE) suspected, low to intermediate prob, positive D-dimer. Left side chest pain. Shortness of breath. EXAM: CT ANGIOGRAPHY CHEST WITH CONTRAST TECHNIQUE: Multidetector CT imaging of the chest was performed using the standard protocol during bolus administration of intravenous contrast. Multiplanar CT image reconstructions and MIPs were obtained to evaluate the vascular anatomy. RADIATION DOSE REDUCTION: This exam was performed according to the departmental dose-optimization program which includes automated exposure control, adjustment of the mA and/or kV according to patient size and/or use of iterative reconstruction technique. CONTRAST:  75mL OMNIPAQUE IOHEXOL 350 MG/ML SOLN COMPARISON:  01/22/2021 FINDINGS: Cardiovascular: No filling defects in the pulmonary arteries to suggest pulmonary emboli. Heart is normal size. Aorta is normal caliber. Mediastinum/Nodes: No mediastinal, hilar, or axillary  adenopathy. Trachea and esophagus are unremarkable. Thyroid unremarkable. Lungs/Pleura: Biapical scarring. Small left pleural effusion. Left base atelectasis. No confluent opacity or effusion on the right. Upper Abdomen: Calcifications within the pancreas compatible chronic pancreatitis. Previously seen cyst within the pancreatic head/uncinate process not definitively seen on today's study. There is fluid adjacent to the pancreatic tail. Stranding adjacent to the pancreatic tail and splenic hilum noted. This extends anteriorly where there is a fluid collection along the  anterior left hemidiaphragm measuring 6.2 x 3.8 cm. This is new since prior study and presumably reflects pseudocyst related to pancreatitis. Musculoskeletal: No acute findings. Review of the MIP images confirms the above findings. IMPRESSION: No evidence of pulmonary embolus. Small left pleural effusion with left base atelectasis. Fluid and stranding noted around the pancreatic tail. 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 Electronically Signed   By: Charlett Nose M.D.   On: 03/17/2023 20:41   DG Chest 2 View  Result Date: 03/17/2023 CLINICAL DATA:  Chest pain for 8 days, sharp in nature. Pain radiates into the back. Fatigue and shortness of breath. EXAM: CHEST - 2 VIEW COMPARISON:  CT chest 01/22/2021 FINDINGS: Cardiac silhouette and mediastinal contours are within limits. There is flattening of the diaphragms and chronic hyperinflation. Increased mild left basilar horizontal linear subsegmental atelectasis versus scarring. Possible tiny left pleural effusion. The right lung is clear. No pneumothorax. Minimal dextrocurvature of the mid to lower thoracic spine. IMPRESSION: Increased mild left basilar horizontal linear subsegmental atelectasis versus scarring. Possible tiny left pleural effusion. Electronically Signed   By: Neita Garnet M.D.   On: 03/17/2023 16:47     TODAY-DAY OF DISCHARGE:  Subjective:   Samuel Abbott today has no headache,no chest abdominal pain,no new weakness tingling or numbness, feels much better wants to go home today.   Objective:   Blood pressure 134/69, pulse 93, temperature 98.9 F (37.2 C), temperature source Oral, resp. rate 20, height 6' (1.829 m), weight 75.8 kg, SpO2 91%. No intake or output data in the 24 hours ending 04/09/23 1149 Filed Weights   04/07/23 1235  Weight: 75.8 kg    Exam: Awake Alert, Oriented *3, No new F.N deficits, Normal affect [place].AT,PERRAL Supple Neck,No JVD, No  cervical lymphadenopathy appriciated.  Symmetrical Chest wall movement, Good air movement bilaterally, CTAB RRR,No Gallops,Rubs or new Murmurs, No Parasternal Heave +ve B.Sounds, Abd Soft, Non tender, No organomegaly appriciated, No rebound -guarding or rigidity. No Cyanosis, Clubbing or edema, No new Rash or bruise   PERTINENT RADIOLOGIC STUDIES: CT Angio Chest/Abd/Pel for Dissection W and/or Wo Contrast  Result Date: 04/07/2023 CLINICAL DATA:  Severe left chest pain radiating to the neck EXAM: CT ANGIOGRAPHY CHEST, ABDOMEN AND PELVIS TECHNIQUE: Non-contrast CT of the chest was initially obtained. Multidetector CT imaging through the chest, abdomen and pelvis was performed using the standard protocol during bolus administration of intravenous contrast. Multiplanar reconstructed images and MIPs were obtained and reviewed to evaluate the vascular anatomy. RADIATION DOSE REDUCTION: This exam was performed according to the departmental dose-optimization program which includes automated exposure control, adjustment of the mA and/or kV according to patient size and/or use of iterative reconstruction technique. CONTRAST:  OMNIPAQUE IOHEXOL 350 MG/ML SOLN COMPARISON:  Chest CT 03/17/2023, 04/07/2023 radiograph, CT 12/03/2022 FINDINGS: CTA CHEST FINDINGS Cardiovascular: Non contrasted images of the chest demonstrate no acute intramural hematoma. Negative for aneurysm or acute aortic dissection. Normal cardiac size. No pericardial effusion Mediastinum/Nodes: Midline trachea.  No thyroid mass. Subcentimeter mediastinal lymph nodes. Esophagus within normal limits. Lungs/Pleura: Small left-sided pleural effusion with adjacent atelectasis at the left base. Negative for pneumothorax. Right apical scarring with calcification. Musculoskeletal: No acute or suspicious osseous abnormality. Review of the MIP images confirms the above findings. CTA ABDOMEN AND PELVIS FINDINGS VASCULAR Aorta: Normal caliber aorta without  aneurysm, dissection, vasculitis or significant stenosis. Celiac: Patent without evidence of aneurysm, dissection, vasculitis or significant stenosis. SMA: Patent without evidence of aneurysm, dissection, vasculitis or significant stenosis. Renals: 2 right and 2 left renal arteries are patent without evidence of aneurysm, dissection, vasculitis, fibromuscular dysplasia or significant stenosis. IMA: Patent without evidence of aneurysm, dissection, vasculitis or significant stenosis. Inflow: Patent without evidence of aneurysm, dissection, vasculitis or significant stenosis. Veins: No obvious venous abnormality within the limitations of this arterial phase study. Review of the MIP images confirms the above findings. NON-VASCULAR Hepatobiliary: No focal liver abnormality is seen. No gallstones, gallbladder wall thickening, or biliary dilatation. Pancreas: Mild pancreatic head calcifications felt consistent with chronic pancreatitis. Minimal stranding adjacent to the pancreatic tail likely recent or mild pancreatitis. Fluid collection at the pancreatic tail, extending towards the splenic hilus, this measures 4 by 2.6 cm. Spleen: Interim development large Peri splenic fluid collection. Subcapsular slightly dense splenic hematoma measuring up to 2.9 cm in thickness. No extravasation to suggest active bleeding at this time. Adrenals/Urinary Tract: Adrenal glands are within normal limits. Kidneys show no hydronephrosis. The bladder is unremarkable Stomach/Bowel: The stomach is nonenlarged. There is no dilated small bowel. Negative appendix. No acute bowel wall thickening Lymphatic: No suspicious lymph node Reproductive: Negative prostate Other: No free air. Rim enhancing fluid collections in the left upper quadrant, for example 4.6 by 2.3 cm collection on series 302, image 110. This is contiguous with fluid extending along the gastric fundus, this collection measures 7 by 2.6 cm on coronal series 601, image 111. Multiple  additional smaller fluid collections. Musculoskeletal: No acute or suspicious osseous abnormality. Review of the MIP images confirms the above findings. IMPRESSION: 1. Negative for acute aortic dissection or aneurysm. No significant vascular disease within the abdomen or pelvis 2. Interim development of subcapsular splenic hematoma measuring up to 2.9 cm in thickness. New large perisplenic slightly dense fluid collection suggestive of contained rupture. No extravasation of contrast to suggest active bleeding on this exam. 3. Findings suspicious for mild acute on chronic distal pancreatitis. Multiple rim enhancing fluid collections in the left upper quadrant, largest contiguous with fluid extending along the gastric fundus, measuring up to 7 cm. These probably represent pseudocyst given findings of mild pancreatic inflammatory change and additional fluid collection at the tail of pancreas. 4. Small left-sided pleural effusion with adjacent atelectasis at the left base. Electronically Signed   By: Jasmine Pang M.D.   On: 04/07/2023 16:19   DG Chest Portable 1 View  Result Date: 04/07/2023 CLINICAL DATA:  Chest pain and shortness of breath. EXAM: PORTABLE CHEST 1 VIEW COMPARISON:  Chest x-ray 03/17/2023.  Chest CT 03/17/2023. FINDINGS: There are findings suspicious for tiny right apical pneumothorax. Biapical calcifications are again noted. Left pleural effusion has resolved. Left basilar atelectasis persists. There is no mediastinal shift. The cardiomediastinal silhouette is within limits. No acute fractures are seen. Questionable IMPRESSION: 1. Findings suspicious for tiny right apical pneumothorax. 2. Left pleural effusion has resolved. Left basilar atelectasis persists. These results were called by telephone at the time of interpretation on 04/07/2023 at 3:17 pm to provider Dr. Gayleen Orem, who verbally acknowledged these  results. Electronically Signed   By: Darliss Cheney M.D.   On: 04/07/2023 15:18     PERTINENT  LAB RESULTS: CBC: Recent Labs    04/08/23 0609 04/09/23 0927  WBC 17.7* 13.4*  HGB 9.6* 9.2*  HCT 29.1* 27.7*  PLT 253 258   CMET CMP     Component Value Date/Time   NA 130 (L) 04/08/2023 0609   K 4.1 04/08/2023 0609   CL 96 (L) 04/08/2023 0609   CO2 23 04/08/2023 0609   GLUCOSE 154 (H) 04/08/2023 0609   BUN 19 04/08/2023 0609   CREATININE 0.93 04/08/2023 0609   CALCIUM 9.0 04/08/2023 0609   PROT 8.1 04/07/2023 1243   ALBUMIN 3.7 04/07/2023 1243   AST 23 04/07/2023 1243   ALT 22 04/07/2023 1243   ALKPHOS 114 04/07/2023 1243   BILITOT 1.1 04/07/2023 1243   GFRNONAA >60 04/08/2023 0609    GFR Estimated Creatinine Clearance: 90.6 mL/min (by C-G formula based on SCr of 0.93 mg/dL). Recent Labs    04/07/23 1243  LIPASE 92*   No results for input(s): "CKTOTAL", "CKMB", "CKMBINDEX", "TROPONINI" in the last 72 hours. Invalid input(s): "POCBNP" No results for input(s): "DDIMER" in the last 72 hours. No results for input(s): "HGBA1C" in the last 72 hours. No results for input(s): "CHOL", "HDL", "LDLCALC", "TRIG", "CHOLHDL", "LDLDIRECT" in the last 72 hours. No results for input(s): "TSH", "T4TOTAL", "T3FREE", "THYROIDAB" in the last 72 hours.  Invalid input(s): "FREET3" No results for input(s): "VITAMINB12", "FOLATE", "FERRITIN", "TIBC", "IRON", "RETICCTPCT" in the last 72 hours. Coags: No results for input(s): "INR" in the last 72 hours.  Invalid input(s): "PT" Microbiology: No results found for this or any previous visit (from the past 240 hour(s)).  FURTHER DISCHARGE INSTRUCTIONS:  Get Medicines reviewed and adjusted: Please take all your medications with you for your next visit with your Primary MD  Laboratory/radiological data: Please request your Primary MD to go over all hospital tests and procedure/radiological results at the follow up, please ask your Primary MD to get all Hospital records sent to his/her office.  In some cases, they will be blood work,  cultures and biopsy results pending at the time of your discharge. Please request that your primary care M.D. goes through all the records of your hospital data and follows up on these results.  Also Note the following: If you experience worsening of your admission symptoms, develop shortness of breath, life threatening emergency, suicidal or homicidal thoughts you must seek medical attention immediately by calling 911 or calling your MD immediately  if symptoms less severe.  You must read complete instructions/literature along with all the possible adverse reactions/side effects for all the Medicines you take and that have been prescribed to you. Take any new Medicines after you have completely understood and accpet all the possible adverse reactions/side effects.   Do not drive when taking Pain medications or sleeping medications (Benzodaizepines)  Do not take more than prescribed Pain, Sleep and Anxiety Medications. It is not advisable to combine anxiety,sleep and pain medications without talking with your primary care practitioner  Special Instructions: If you have smoked or chewed Tobacco  in the last 2 yrs please stop smoking, stop any regular Alcohol  and or any Recreational drug use.  Wear Seat belts while driving.  Please note: You were cared for by a hospitalist during your hospital stay. Once you are discharged, your primary care physician will handle any further medical issues. Please note that NO REFILLS for any discharge  medications will be authorized once you are discharged, as it is imperative that you return to your primary care physician (or establish a relationship with a primary care physician if you do not have one) for your post hospital discharge needs so that they can reassess your need for medications and monitor your lab values.  Total Time spent coordinating discharge including counseling, education and face to face time equals greater than 30 minutes.  SignedJeoffrey Massed 04/09/2023 11:49 AM

## 2023-04-12 ENCOUNTER — Other Ambulatory Visit: Payer: Self-pay | Admitting: *Deleted

## 2023-04-12 ENCOUNTER — Telehealth: Payer: Self-pay | Admitting: *Deleted

## 2023-04-12 DIAGNOSIS — K852 Alcohol induced acute pancreatitis without necrosis or infection: Secondary | ICD-10-CM

## 2023-04-12 DIAGNOSIS — K860 Alcohol-induced chronic pancreatitis: Secondary | ICD-10-CM

## 2023-04-12 DIAGNOSIS — K863 Pseudocyst of pancreas: Secondary | ICD-10-CM

## 2023-04-12 NOTE — Telephone Encounter (Signed)
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  Dr Ronnette Juniper will schedule patient with you but please let me know if you would prefer that they be established with Dr. Leonides Schanz as she actually we will see them briefly today at the hospital.  Thanks, JL L

## 2023-04-12 NOTE — Telephone Encounter (Signed)
Called patient in reference to repeat of CT ABD with pancreatic protocol and to schedule f/u appt with Mansouraty after imaging for pancreatic pseudocyst. Unable to speak to patient and left message for callback at [phone].

## 2023-04-13 ENCOUNTER — Telehealth: Payer: Self-pay | Admitting: *Deleted

## 2023-04-13 NOTE — Telephone Encounter (Addendum)
-----   Message from [facility] sent at 04/10/2023  5:53 AM EDT ----- Regarding: RE: follow up and ct That intra-abdominal area is a significant mess at this time.  Certainly needs to heal up from that hematoma.  Not sure an endoscopic cystgastrostomy is needed at all. I am fine with this being a Dorsey patient since she ended up seeing him in the hospital, I suspect he probably will not follow-up but time will tell. Agree with imaging that you have outlined, but would certainly make sure that that is performed before a clinic visit so that there can be some understanding of how he is doing at that clinic follow-up. I can be available to review as needed.. If he still drinking significantly, it is going to be hard for him to heal this up. 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  Dr Ronnette Juniper will schedule patient with you but please let me know if you would prefer that they be established with Dr. Leonides Schanz as she actually we will see them briefly today at the hospital.  Thanks, JL L

## 2023-04-13 NOTE — Telephone Encounter (Signed)
This encounter has been addressed.

## 2023-04-13 NOTE — Telephone Encounter (Signed)
Called patient to schedule appt with Dr. Meridee Score on 07/21/23 after the repeat of the CT ABD pancreatic protocol ordered via Ms. Lemmon on 04/12/23. Patient questioned initially what was on the CT he had at [facility] in reference to his pancrease. Patient states he was not notified of the results and was sent home without notification. Patient then states, the last CT taken on 04/07/23 showed he has a pancreatic cyst at 7mm. Patient wants to know if it is cancerous? Informed patient the provider will be notified and he will be contacted as soon as the provider notifies the nurse. Patient understood and agrees.

## 2023-04-13 NOTE — Telephone Encounter (Signed)
Called patient with noted information received via Ms. Lemmon, PA in reference to patient's concerns about pancreatic cancer. Informed the patient Ms. Lemmon, [place] states, we think it is not CA and is possibly do to recurrent pancreatitis. Patient was also informed this was an discussion that was had during his hospitalization. Patient agreed. Appt cancelled with Dr. Meridee Score and re-scheduled with Ms. Havery Moros on 07/30/23 at 3:00p. Patient understood and in agreement.

## 2023-04-20 ENCOUNTER — Encounter (HOSPITAL_BASED_OUTPATIENT_CLINIC_OR_DEPARTMENT_OTHER): Payer: Self-pay | Admitting: Emergency Medicine

## 2023-04-20 ENCOUNTER — Emergency Department (HOSPITAL_BASED_OUTPATIENT_CLINIC_OR_DEPARTMENT_OTHER): Payer: 59

## 2023-04-20 ENCOUNTER — Other Ambulatory Visit: Payer: Self-pay

## 2023-04-20 ENCOUNTER — Inpatient Hospital Stay (HOSPITAL_BASED_OUTPATIENT_CLINIC_OR_DEPARTMENT_OTHER)
Admission: EM | Admit: 2023-04-20 | Discharge: 2023-04-23 | DRG: 800 | Disposition: A | Discharge status: Home / Self Care | Payer: 59 | Attending: Internal Medicine | Admitting: Internal Medicine

## 2023-04-20 DIAGNOSIS — Z7984 Long term (current) use of oral hypoglycemic drugs: Secondary | ICD-10-CM | POA: Diagnosis not present

## 2023-04-20 DIAGNOSIS — E1165 Type 2 diabetes mellitus with hyperglycemia: Secondary | ICD-10-CM | POA: Diagnosis present

## 2023-04-20 DIAGNOSIS — G8929 Other chronic pain: Secondary | ICD-10-CM | POA: Diagnosis present

## 2023-04-20 DIAGNOSIS — Z823 Family history of stroke: Secondary | ICD-10-CM | POA: Diagnosis not present

## 2023-04-20 DIAGNOSIS — Z23 Encounter for immunization: Secondary | ICD-10-CM

## 2023-04-20 DIAGNOSIS — Z765 Malingerer [conscious simulation]: Secondary | ICD-10-CM | POA: Diagnosis not present

## 2023-04-20 DIAGNOSIS — F1010 Alcohol abuse, uncomplicated: Secondary | ICD-10-CM | POA: Diagnosis present

## 2023-04-20 DIAGNOSIS — F32A Depression, unspecified: Secondary | ICD-10-CM | POA: Diagnosis present

## 2023-04-20 DIAGNOSIS — D735 Infarction of spleen: Secondary | ICD-10-CM | POA: Diagnosis present

## 2023-04-20 DIAGNOSIS — E861 Hypovolemia: Secondary | ICD-10-CM | POA: Diagnosis present

## 2023-04-20 DIAGNOSIS — I10 Essential (primary) hypertension: Secondary | ICD-10-CM | POA: Diagnosis present

## 2023-04-20 DIAGNOSIS — D72829 Elevated white blood cell count, unspecified: Secondary | ICD-10-CM | POA: Diagnosis present

## 2023-04-20 DIAGNOSIS — D500 Iron deficiency anemia secondary to blood loss (chronic): Secondary | ICD-10-CM | POA: Diagnosis present

## 2023-04-20 DIAGNOSIS — I728 Aneurysm of other specified arteries: Secondary | ICD-10-CM | POA: Diagnosis present

## 2023-04-20 DIAGNOSIS — E871 Hypo-osmolality and hyponatremia: Secondary | ICD-10-CM | POA: Diagnosis present

## 2023-04-20 DIAGNOSIS — K863 Pseudocyst of pancreas: Secondary | ICD-10-CM | POA: Diagnosis present

## 2023-04-20 DIAGNOSIS — S36029A Unspecified contusion of spleen, initial encounter: Secondary | ICD-10-CM

## 2023-04-20 DIAGNOSIS — K861 Other chronic pancreatitis: Secondary | ICD-10-CM | POA: Diagnosis present

## 2023-04-20 DIAGNOSIS — Z880 Allergy status to penicillin: Secondary | ICD-10-CM | POA: Diagnosis not present

## 2023-04-20 DIAGNOSIS — E860 Dehydration: Secondary | ICD-10-CM | POA: Diagnosis present

## 2023-04-20 DIAGNOSIS — E7800 Pure hypercholesterolemia, unspecified: Secondary | ICD-10-CM | POA: Diagnosis present

## 2023-04-20 DIAGNOSIS — Z79899 Other long term (current) drug therapy: Secondary | ICD-10-CM | POA: Diagnosis not present

## 2023-04-20 DIAGNOSIS — F419 Anxiety disorder, unspecified: Secondary | ICD-10-CM | POA: Diagnosis present

## 2023-04-20 DIAGNOSIS — F17210 Nicotine dependence, cigarettes, uncomplicated: Secondary | ICD-10-CM | POA: Diagnosis present

## 2023-04-20 DIAGNOSIS — S3600XA Unspecified injury of spleen, initial encounter: Secondary | ICD-10-CM | POA: Diagnosis not present

## 2023-04-20 HISTORY — DX: Type 2 diabetes mellitus without complications: E11.9

## 2023-04-20 LAB — COMPREHENSIVE METABOLIC PANEL
ALT: 99 U/L — ABNORMAL HIGH (ref 0–44)
AST: 36 U/L (ref 15–41)
Albumin: 3.2 g/dL — ABNORMAL LOW (ref 3.5–5.0)
Alkaline Phosphatase: 117 U/L (ref 38–126)
Anion gap: 12 (ref 5–15)
BUN: 14 mg/dL (ref 6–20)
CO2: 23 mmol/L (ref 22–32)
Calcium: 8.8 mg/dL — ABNORMAL LOW (ref 8.9–10.3)
Chloride: 97 mmol/L — ABNORMAL LOW (ref 98–111)
Creatinine, Ser: 0.72 mg/dL (ref 0.61–1.24)
GFR, Estimated: >60 mL/min (ref >60)
Glucose, Bld: 249 mg/dL — ABNORMAL HIGH (ref 70–99)
Potassium: 3.5 mmol/L (ref 3.5–5.1)
Sodium: 132 mmol/L — ABNORMAL LOW (ref 135–145)
Total Bilirubin: 0.5 mg/dL (ref 0.3–1.2)
Total Protein: 6.9 g/dL (ref 6.5–8.1)

## 2023-04-20 LAB — CBC
HCT: 31 % — ABNORMAL LOW (ref 39.0–52.0)
Hemoglobin: 10.2 g/dL — ABNORMAL LOW (ref 13.0–17.0)
MCH: 29.8 pg (ref 26.0–34.0)
MCHC: 32.9 g/dL (ref 30.0–36.0)
MCV: 90.6 fL (ref 80.0–100.0)
Platelets: 463 10*3/uL — ABNORMAL HIGH (ref 150–400)
RBC: 3.42 MIL/uL — ABNORMAL LOW (ref 4.22–5.81)
RDW: 13.5 % (ref 11.5–15.5)
WBC: 15.1 10*3/uL — ABNORMAL HIGH (ref 4.0–10.5)
nRBC: 0 % (ref 0.0–0.2)

## 2023-04-20 LAB — LIPASE, BLOOD: Lipase: 104 U/L — ABNORMAL HIGH (ref 11–51)

## 2023-04-20 LAB — TROPONIN I (HIGH SENSITIVITY): Troponin I (High Sensitivity): 3 ng/L (ref <18)

## 2023-04-20 MED ORDER — HYDROMORPHONE HCL 1 MG/ML IJ SOLN
1.0000 mg | INTRAMUSCULAR | Status: AC | PRN
  Administered 2023-04-20 – 2023-04-21 (×3): 1 mg via INTRAVENOUS
  Filled 2023-04-20 (×3): qty 1

## 2023-04-20 MED ORDER — IOHEXOL 350 MG/ML SOLN
100.0000 mL | Freq: Once | INTRAVENOUS | Status: AC | PRN
  Administered 2023-04-20: 100 mL via INTRAVENOUS

## 2023-04-20 MED ORDER — NICOTINE 7 MG/24HR TD PT24
7.0000 mg | MEDICATED_PATCH | Freq: Once | TRANSDERMAL | Status: DC
  Administered 2023-04-20: 7 mg via TRANSDERMAL
  Filled 2023-04-20: qty 1

## 2023-04-20 MED ORDER — ONDANSETRON HCL 4 MG/2ML IJ SOLN
4.0000 mg | Freq: Once | INTRAMUSCULAR | Status: AC
  Administered 2023-04-20: 4 mg via INTRAVENOUS
  Filled 2023-04-20: qty 2

## 2023-04-20 MED ORDER — METOCLOPRAMIDE HCL 5 MG/ML IJ SOLN
10.0000 mg | Freq: Once | INTRAMUSCULAR | Status: AC
  Administered 2023-04-20: 10 mg via INTRAVENOUS
  Filled 2023-04-20: qty 2

## 2023-04-20 MED ORDER — DIPHENHYDRAMINE HCL 50 MG/ML IJ SOLN
12.5000 mg | Freq: Once | INTRAMUSCULAR | Status: AC
  Administered 2023-04-20: 12.5 mg via INTRAVENOUS
  Filled 2023-04-20: qty 1

## 2023-04-20 MED ORDER — SODIUM CHLORIDE 0.9 % IV BOLUS
1000.0000 mL | Freq: Once | INTRAVENOUS | Status: AC
  Administered 2023-04-20: 1000 mL via INTRAVENOUS

## 2023-04-20 MED ORDER — HYDROMORPHONE HCL 1 MG/ML IJ SOLN
1.0000 mg | Freq: Once | INTRAMUSCULAR | Status: DC
  Filled 2023-04-20: qty 1

## 2023-04-20 MED ORDER — HYDROMORPHONE HCL 1 MG/ML IJ SOLN
1.0000 mg | INTRAMUSCULAR | Status: AC | PRN
  Administered 2023-04-20 (×3): 1 mg via INTRAVENOUS
  Filled 2023-04-20 (×2): qty 1

## 2023-04-20 MED ORDER — HYDROMORPHONE HCL 1 MG/ML IJ SOLN
1.0000 mg | Freq: Once | INTRAMUSCULAR | Status: AC
  Administered 2023-04-20: 1 mg via INTRAVENOUS
  Filled 2023-04-20: qty 1

## 2023-04-20 MED ORDER — HYDROMORPHONE HCL 1 MG/ML IJ SOLN: 1.0000 mg | Freq: Once | INTRAMUSCULAR | Status: DC

## 2023-04-20 NOTE — ED Notes (Signed)
Report given to [facility] with [facility]

## 2023-04-20 NOTE — ED Triage Notes (Signed)
Pt to ER states he was admitted to the hospital on 9/25 for a "torn spleen".  States he was treated with "something to make the blood thicker".  States that last week he started to have an increase in his left flank and shoulder pain and that he "hears gurgling" in his left flank.  He thinks that he has internal bleeding again and would like to have it checked.

## 2023-04-20 NOTE — ED Notes (Signed)
Pt aware he has a bed @[facility]  waiting on [facility] to transport. Pt c/o pain EDP made aware to reorder PRN doses of Dilaudid

## 2023-04-20 NOTE — ED Notes (Signed)
[facility] called for transport. 

## 2023-04-20 NOTE — ED Provider Notes (Signed)
[facility] Provider Note   CSN: [id number] Arrival date & time: 04/20/23  1219     History  Chief Complaint  Patient presents with   Flank Pain   Shoulder Pain    Samuel Abbott is a 62 y.o. male.   Flank Pain Associated symptoms include chest pain.  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  Symptoms have been ongoing for the past few days and have been worsening.  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  His hematoma was found to be stable and he was followed by the general surgery service and supportive care had been recommended.  He states that his pain has worsened over the past few days and now radiates from his left upper back up to his left shoulder.  He denies any nausea or vomiting or diaphoresis.  He states that pain feels similar to his prior presentation on 9/25.  Home Medications Prior to Admission medications   Medication Sig Start Date End Date Taking? Authorizing Provider  atorvastatin (LIPITOR) 40 MG tablet Take 1 tablet (40 mg total) by mouth daily. 01/25/21   Valetta Close, MD  DULoxetine (CYMBALTA) 20 MG capsule Take 20 mg by mouth 2 (two) times daily. 01/13/21   [provider]  hydrOXYzine (VISTARIL) 25 MG capsule Take 25 mg by mouth at bedtime. 11/29/20   [provider]  losartan (COZAAR) 25 MG tablet Take 25 mg by mouth daily. 09/16/20   [provider]  metFORMIN (GLUCOPHAGE) 500 MG tablet Take 1 tablet (500 mg total) by  mouth daily with breakfast. 01/24/21 04/07/23  Jovita Kussmaul, MD  Multiple Vitamin (MULTIVITAMIN) capsule Take 1 capsule by mouth daily.    [provider]  nicotine (NICODERM CQ - DOSED IN MG/24 HOURS) 21 mg/24hr patch Place 1 patch (21 mg total) onto the skin daily. Patient not taking: Reported on 04/07/2023 01/25/21   Valetta Close, MD  ondansetron (ZOFRAN-ODT) 4 MG disintegrating tablet Take 1 tablet (4 mg total) by mouth every 8 (eight) hours as needed for nausea or vomiting. 12/03/22   Smoot, Shawn Route, PA-C  oxyCODONE-acetaminophen (PERCOCET/ROXICET) 5-325 MG tablet Take 1-2 tablets by mouth every 6 (six) hours as needed for severe pain. 04/09/23   Ghimire, Werner Lean, MD  polyethylene glycol (MIRALAX / GLYCOLAX) 17 g packet Take 17 g by mouth daily. 04/10/23   Ghimire, Werner Lean, MD  senna-docusate (SENOKOT-S) 8.6-50 MG tablet Take 2 tablets by mouth at bedtime. 04/09/23   Ghimire, Werner Lean, MD  tiZANidine (ZANAFLEX) 4 MG tablet Take 4 mg by mouth every 8 (eight) hours as needed for muscle spasms. 11/29/20   [provider]      Allergies    Amoxicillin and Penicillins    Review of Systems   Review of Systems  Cardiovascular:  Positive for chest pain.  Genitourinary:  Positive for flank pain.  All other systems reviewed and are negative.   Physical Exam Updated Vital Signs BP 135/74   Pulse 73   Temp 98.1 F (36.7 C) (  Oral)   Resp 18   Ht 6' (1.829 m)   Wt 75.8 kg   SpO2 96%   BMI 22.65 kg/m  Physical Exam Vitals and nursing note reviewed.  Constitutional:      General: He is not in acute distress.    Appearance: He is well-developed.  HENT:     Head: Normocephalic and atraumatic.  Eyes:     Conjunctiva/sclera: Conjunctivae normal.  Cardiovascular:     Rate and Rhythm: Normal rate and regular rhythm.  Pulmonary:     Effort: Pulmonary effort is normal. No respiratory distress.     Breath sounds: Normal breath sounds.  Abdominal:     Palpations:  Abdomen is soft.     Tenderness: There is abdominal tenderness. There is guarding.     Comments: Mild left upper quadrant tenderness to palpation  Musculoskeletal:        General: No swelling.     Cervical back: Neck supple.     Comments: Left upper flank significant tenderness to palpation, no obvious hematoma or bruising  Skin:    General: Skin is warm and dry.     Capillary Refill: Capillary refill takes less than 2 seconds.  Neurological:     Mental Status: He is alert.  Psychiatric:        Mood and Affect: Mood normal.     ED Results / Procedures / Treatments   Labs (all labs ordered are listed, but only abnormal results are displayed) Labs Reviewed  COMPREHENSIVE METABOLIC PANEL - Abnormal; Notable for the following components:      Result Value   Sodium 132 (*)    Chloride 97 (*)    Glucose, Bld 249 (*)    Calcium 8.8 (*)    Albumin 3.2 (*)    ALT 99 (*)    All other components within normal limits  CBC - Abnormal; Notable for the following components:   WBC 15.1 (*)    RBC 3.42 (*)    Hemoglobin 10.2 (*)    HCT 31.0 (*)    Platelets 463 (*)    All other components within normal limits  LIPASE, BLOOD - Abnormal; Notable for the following components:   Lipase 104 (*)    All other components within normal limits  TROPONIN I (HIGH SENSITIVITY)    EKG EKG Interpretation Date/Time:  Tuesday April 20 2023 16:41:55 EDT Ventricular Rate:  72 PR Interval:  170 QRS Duration:  144 QT Interval:  453 QTC Calculation: 496 R Axis:   63  Text Interpretation: Sinus rhythm Right bundle branch block Confirmed by Ernie Avena ([id number]) on 04/20/2023 4:54:16 PM  Radiology CT Angio Chest/Abd/Pel for Dissection W and/or Wo Contrast  Result Date: 04/20/2023 CLINICAL DATA:  Left flank pain radiating to left shoulder. History of perisplenic hematoma. EXAM: CT ANGIOGRAPHY CHEST, ABDOMEN AND PELVIS TECHNIQUE: Non-contrast CT of the chest was initially obtained. Multidetector CT  imaging through the chest, abdomen and pelvis was performed using the standard protocol during bolus administration of intravenous contrast. Multiplanar reconstructed images and MIPs were obtained and reviewed to evaluate the vascular anatomy. RADIATION DOSE REDUCTION: This exam was performed according to the departmental dose-optimization program which includes automated exposure control, adjustment of the mA and/or kV according to patient size and/or use of iterative reconstruction technique. CONTRAST:  OMNIPAQUE IOHEXOL 350 MG/ML SOLN COMPARISON:  04/07/2023 FINDINGS: CTA CHEST FINDINGS Cardiovascular: Heart is normal size. Aorta is normal caliber. No dissection No filling defects in the pulmonary arteries  to suggest pulmonary emboli. Mediastinum/Nodes: No mediastinal, hilar, or axillary adenopathy. Trachea and esophagus are unremarkable. Thyroid unremarkable. Lungs/Pleura: Biapical scarring. Trace left pleural effusion. No confluent opacities or pneumothorax. Musculoskeletal: Chest wall soft tissues are unremarkable. No acute bony abnormality. Review of the MIP images confirms the above findings. CTA ABDOMEN AND PELVIS FINDINGS VASCULAR Aorta: Normal caliber aorta without aneurysm, dissection, vasculitis or significant stenosis. Celiac: Patent. 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 This is concerning for the source of the perisplenic hematoma. SMA: Patent without evidence of aneurysm, dissection, vasculitis or significant stenosis. Renals: Both renal arteries are patent without evidence of aneurysm, dissection, vasculitis, fibromuscular dysplasia or significant stenosis. IMA: Patent without evidence of aneurysm, dissection, vasculitis or significant stenosis. Inflow: Patent without evidence of aneurysm, dissection, vasculitis or significant stenosis. Veins: No obvious venous abnormality within the  limitations of this arterial phase study. Review of the MIP images confirms the above findings. NON-VASCULAR Hepatobiliary: No focal hepatic abnormality. Gallbladder unremarkable. Pancreas: Calcifications throughout the pancreas compatible with chronic pancreatitis. No evidence of acute pancreatitis or ductal dilatation. Spleen: Large perisplenic hematoma again noted, measuring approximately 13.5 x 6.2 cm. With measured in the same planes and at the same level on prior study, this measured approximately 13 x 4.4 cm. This is felt to be slightly larger than prior study. Adrenals/Urinary Tract: No adrenal abnormality. No focal renal abnormality. No stones or hydronephrosis. Urinary bladder is unremarkable. Stomach/Bowel: Normal appendix. Stomach, large and small bowel grossly unremarkable. Lymphatic: No adenopathy Reproductive: No visible focal abnormality. Other: No free fluid or free air. Musculoskeletal: No acute bony abnormality. Review of the MIP images confirms the above findings. IMPRESSION: 1.5 cm rounded enhancing hypervascular area adjacent to the splenic artery in the region of the splenic hilum adjacent to the pancreatic tail. 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 Large perisplenic hematoma, 13.5 x 6.2 cm, slightly increased in size since prior study. These results were called by telephone at the time of interpretation on 04/20/2023 at 4:33 pm to provider [facility] , who verbally acknowledged these results. Electronically Signed   By: Charlett Nose M.D.   On: 04/20/2023 16:34    Procedures .Critical Care  Performed by: Ernie Avena, MD Authorized by: Ernie Avena, MD   Critical care provider statement:    Critical care time (minutes):  30   Critical care was time spent personally by me on the following activities:  Development of treatment plan with patient or surrogate, discussions with consultants, evaluation of patient's response to treatment, examination of patient,  ordering and review of laboratory studies, ordering and review of radiographic studies, ordering and performing treatments and interventions, pulse oximetry, re-evaluation of patient's condition and review of old charts   Care discussed with: admitting provider       Medications Ordered in ED Medications  HYDROmorphone (DILAUDID) injection 1 mg (1 mg Intravenous Given 04/20/23 1634)  nicotine (NICODERM CQ - dosed in mg/24 hr) patch 7 mg (7 mg Transdermal Patch Applied 04/20/23 1652)  HYDROmorphone (DILAUDID) injection 1 mg (1 mg Intravenous Given 04/20/23 1457)  sodium chloride 0.9 % bolus 1,000 mL (0 mLs Intravenous Stopped 04/20/23 1631)  ondansetron (ZOFRAN) injection 4 mg (4 mg Intravenous Given 04/20/23 1457)  iohexol (OMNIPAQUE) 350 MG/ML injection 100 mL (100 mLs Intravenous Contrast Given 04/20/23 1528)    ED Course/ Medical Decision Making/ A&P Clinical Course as of 04/20/23 1655  Tue Apr 20, 2023  1542 Lipase(!): 104 [JL]    Clinical Course User Index [JL] Ernie Avena, MD                                 Medical Decision Making Amount and/or Complexity of Data Reviewed Labs: ordered. Decision-making details documented in ED Course. Radiology: ordered.  Risk OTC drugs. Prescription drug management. Decision regarding hospitalization.     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  Symptoms have been ongoing for the past few days and have been worsening.  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  His hematoma was found to be stable  and he was followed by the general surgery service and supportive care had been recommended.  He states that his pain has worsened over the past few days and now radiates from his left upper back up to his left shoulder.  He denies any nausea or vomiting or diaphoresis.  He states that pain feels similar to his prior presentation on 9/25.  On arrival, the patient was afebrile, vitally stable, not tachycardic heart rate 88, BP 101/88, saturating 100% on room air.  Physical exam significant for significant left flank and CVA tenderness to palpation and mild left upper quadrant tenderness.  Differential diagnosis includes aortic dissection, worsening splenic hematoma, splenic artery aneurysm or rupture, nephrolithiasis, less likely other acute intra-abdominal abnormality given the patient's recent presentation.  Considered ACS as the patient's left upper quadrant pain radiates up to his left shoulder.  EKG: Sinus rhythm, rate 72, RBBB, no STEMI.  1610 I received a call from radiology that the patient's splenic hematoma appears to be enlarging, also he now has what appears to be a new splenic artery aneurysm or pseudoaneurysm that is new compared to his CTA on 9/25.  CTA Dissection: IMPRESSION:  1.5 cm rounded enhancing hypervascular area adjacent to the splenic  artery in the region of the splenic hilum adjacent to the pancreatic  tail. 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    Large perisplenic hematoma, 13.5 x 6.2 cm, slightly increased in  size since prior study.    These results were called by telephone at the time of interpretation  on 04/20/2023 at 4:33 pm to provider [facility] , who verbally  acknowledged these results.    Vascular surgery consult placed, IR consult placed. Dr. Randie Heinz recommended IR for management. Hospitalist consulted for admission. The patient remained hemodynamically stable at time of admission. The hospitalist requested that general surgery be  consulted and I spoke with Dr. Donell Beers who will let the [facility] general surgery and no and general surgery will see the patient in consultation.  IR: Spoke with Dr. Bryn Gulling of IR regarding the patient. Patient admitted in stable condition.   Final Clinical Impression(s) / ED Diagnoses Final diagnoses:  Splenic artery aneurysm (HCC)  Spleen hematoma, initial encounter    Rx / DC Orders ED Discharge Orders     None         Ernie Avena, MD 04/20/23 [phone]

## 2023-04-20 NOTE — ED Notes (Signed)
Report called to [employer] RN @ [facility]

## 2023-04-20 NOTE — Plan of Care (Signed)
Patient is a 62 year old male with history of hypertension, hyperlipidemia, diabetes type 2, chronic alcohol abuse, alcoholic pancreatitis who presented to the emergency department at [facility] with complaint of severe left upper back pain radiating to the left shoulder.  Symptoms ongoing for last few days, progressively getting worse.  CT angiogram of the chest/abdomen/pelvis showed 1.5 cm rounded enhancing hypervascular area adjacent to the splenic artery concerning for leaking aneurysm or pseudoaneurysm.also showed large perisplenic hematoma, 13.5 x 6.2 cm, slightly increased in size since prior study. Patient was recently discharged from here on 9/27 when he presented with similar symptoms and was found to have a splenic rupture/hematoma and pancreatitis with pseudocyst.  He was being followed by general surgery/trauma and was recommended to continue supportive care, conservative management and was discharged to home with a stable hemoglobin. We were asked for admission because of persistent pain, finding of slightly enlarged splenic hematoma.  Case was discussed with vascular surgery, Dr. Randie Heinz who recommended IR for management.  IR consultation placed. Lab work showed WC count of 15.1, hemoglobin of 10.2 which is better than the last hemoglobin when he was discharged with.  Lipase is 104. Patient is currently hemodynamically stable.  Since general surgery was following the patient on last admission, I have requested the ED physician to call general surgery also for consultation.  Make sure that general surgery follows when the patient comes here. Patient has been drinking, the last drink was last Friday.  Drinks 3-4 beers daily.  Also smokes.  Might need to put on CIWA protocol. Patient will be admitted to progressive bed.

## 2023-04-21 ENCOUNTER — Encounter (HOSPITAL_COMMUNITY)
Admission: EM | Disposition: A | Discharge status: Home / Self Care | Payer: Self-pay | Source: Home / Self Care | Attending: Internal Medicine

## 2023-04-21 ENCOUNTER — Inpatient Hospital Stay (HOSPITAL_COMMUNITY): Payer: 59

## 2023-04-21 ENCOUNTER — Other Ambulatory Visit: Payer: Self-pay

## 2023-04-21 ENCOUNTER — Encounter (HOSPITAL_COMMUNITY): Payer: Self-pay | Admitting: Internal Medicine

## 2023-04-21 DIAGNOSIS — D735 Infarction of spleen: Secondary | ICD-10-CM | POA: Diagnosis not present

## 2023-04-21 DIAGNOSIS — S3600XA Unspecified injury of spleen, initial encounter: Secondary | ICD-10-CM

## 2023-04-21 HISTORY — PX: SPLENECTOMY, TOTAL: SHX788

## 2023-04-21 LAB — CBC
HCT: 27.5 % — ABNORMAL LOW (ref 39.0–52.0)
HCT: 29.3 % — ABNORMAL LOW (ref 39.0–52.0)
HCT: 25.3 % — ABNORMAL LOW (ref 39.0–52.0)
Hemoglobin: 8.9 g/dL — ABNORMAL LOW (ref 13.0–17.0)
Hemoglobin: 9.8 g/dL — ABNORMAL LOW (ref 13.0–17.0)
Hemoglobin: 8.5 g/dL — ABNORMAL LOW (ref 13.0–17.0)
MCH: 30.1 pg (ref 26.0–34.0)
MCH: 29.7 pg (ref 26.0–34.0)
MCH: 29.4 pg (ref 26.0–34.0)
MCHC: 32.4 g/dL (ref 30.0–36.0)
MCHC: 33.4 g/dL (ref 30.0–36.0)
MCHC: 33.6 g/dL (ref 30.0–36.0)
MCV: 92.9 fL (ref 80.0–100.0)
MCV: 88.8 fL (ref 80.0–100.0)
MCV: 87.5 fL (ref 80.0–100.0)
Platelets: 403 10*3/uL — ABNORMAL HIGH (ref 150–400)
Platelets: 354 10*3/uL (ref 150–400)
Platelets: 328 10*3/uL (ref 150–400)
RBC: 2.96 MIL/uL — ABNORMAL LOW (ref 4.22–5.81)
RBC: 3.3 MIL/uL — ABNORMAL LOW (ref 4.22–5.81)
RBC: 2.89 MIL/uL — ABNORMAL LOW (ref 4.22–5.81)
RDW: 13.5 % (ref 11.5–15.5)
RDW: 14.1 % (ref 11.5–15.5)
RDW: 14 % (ref 11.5–15.5)
WBC: 13.4 10*3/uL — ABNORMAL HIGH (ref 4.0–10.5)
WBC: 28.5 10*3/uL — ABNORMAL HIGH (ref 4.0–10.5)
WBC: 25.4 10*3/uL — ABNORMAL HIGH (ref 4.0–10.5)
nRBC: 0 % (ref 0.0–0.2)
nRBC: 0 % (ref 0.0–0.2)
nRBC: 0 % (ref 0.0–0.2)

## 2023-04-21 LAB — POCT I-STAT EG7
Acid-base deficit: 2 mmol/L (ref 0.0–2.0)
Bicarbonate: 24.6 mmol/L (ref 20.0–28.0)
Calcium, Ion: 1.29 mmol/L (ref 1.15–1.40)
HCT: 32 % — ABNORMAL LOW (ref 39.0–52.0)
Hemoglobin: 10.9 g/dL — ABNORMAL LOW (ref 13.0–17.0)
O2 Saturation: 55 %
Potassium: 4.2 mmol/L (ref 3.5–5.1)
Sodium: 131 mmol/L — ABNORMAL LOW (ref 135–145)
TCO2: 26 mmol/L (ref 22–32)
pCO2, Ven: 50.6 mm[Hg] (ref 44–60)
pH, Ven: 7.294 (ref 7.25–7.43)
pO2, Ven: 33 mm[Hg] (ref 32–45)

## 2023-04-21 LAB — COMPREHENSIVE METABOLIC PANEL
ALT: 73 U/L — ABNORMAL HIGH (ref 0–44)
AST: 18 U/L (ref 15–41)
Albumin: 2.7 g/dL — ABNORMAL LOW (ref 3.5–5.0)
Alkaline Phosphatase: 98 U/L (ref 38–126)
Anion gap: 8 (ref 5–15)
BUN: 11 mg/dL (ref 6–20)
CO2: 25 mmol/L (ref 22–32)
Calcium: 8.8 mg/dL — ABNORMAL LOW (ref 8.9–10.3)
Chloride: 99 mmol/L (ref 98–111)
Creatinine, Ser: 0.6 mg/dL — ABNORMAL LOW (ref 0.61–1.24)
GFR, Estimated: >60 mL/min (ref >60)
Glucose, Bld: 134 mg/dL — ABNORMAL HIGH (ref 70–99)
Potassium: 3.6 mmol/L (ref 3.5–5.1)
Sodium: 132 mmol/L — ABNORMAL LOW (ref 135–145)
Total Bilirubin: 0.7 mg/dL (ref 0.3–1.2)
Total Protein: 6 g/dL — ABNORMAL LOW (ref 6.5–8.1)

## 2023-04-21 LAB — HEMOGLOBIN A1C
Hgb A1c MFr Bld: 7 % — ABNORMAL HIGH (ref 4.8–5.6)
Mean Plasma Glucose: 154.2 mg/dL

## 2023-04-21 LAB — GLUCOSE, CAPILLARY
Glucose-Capillary: 134 mg/dL — ABNORMAL HIGH (ref 70–99)
Glucose-Capillary: 130 mg/dL — ABNORMAL HIGH (ref 70–99)
Glucose-Capillary: 113 mg/dL — ABNORMAL HIGH (ref 70–99)
Glucose-Capillary: 154 mg/dL — ABNORMAL HIGH (ref 70–99)
Glucose-Capillary: 174 mg/dL — ABNORMAL HIGH (ref 70–99)
Glucose-Capillary: 173 mg/dL — ABNORMAL HIGH (ref 70–99)
Glucose-Capillary: 138 mg/dL — ABNORMAL HIGH (ref 70–99)

## 2023-04-21 LAB — PHOSPHORUS: Phosphorus: 4.1 mg/dL (ref 2.5–4.6)

## 2023-04-21 LAB — LACTIC ACID, PLASMA: Lactic Acid, Venous: 0.7 mmol/L (ref 0.5–1.9)

## 2023-04-21 LAB — PROTIME-INR
INR: 1.1 (ref 0.8–1.2)
Prothrombin Time: 13.9 s (ref 11.4–15.2)

## 2023-04-21 LAB — HEMOGLOBIN AND HEMATOCRIT, BLOOD
HCT: 30.5 % — ABNORMAL LOW (ref 39.0–52.0)
Hemoglobin: 9.8 g/dL — ABNORMAL LOW (ref 13.0–17.0)

## 2023-04-21 LAB — PREPARE RBC (CROSSMATCH)

## 2023-04-21 LAB — MAGNESIUM: Magnesium: 1.9 mg/dL (ref 1.7–2.4)

## 2023-04-21 LAB — ABO/RH: ABO/RH(D): O POS

## 2023-04-21 SURGERY — SPLENECTOMY
Anesthesia: General

## 2023-04-21 MED ORDER — LIDOCAINE 2% (20 MG/ML) 5 ML SYRINGE
INTRAMUSCULAR | Status: DC | PRN
  Administered 2023-04-21: 100 mg via INTRAVENOUS

## 2023-04-21 MED ORDER — ACETAMINOPHEN 325 MG PO TABS: 650.0000 mg | ORAL_TABLET | Freq: Four times a day (QID) | ORAL | Status: DC | PRN

## 2023-04-21 MED ORDER — SUCCINYLCHOLINE CHLORIDE 200 MG/10ML IV SOSY
PREFILLED_SYRINGE | INTRAVENOUS | Status: AC
  Filled 2023-04-21: qty 10

## 2023-04-21 MED ORDER — CHLORHEXIDINE GLUCONATE 0.12 % MT SOLN: 15.0000 mL | Freq: Once | OROMUCOSAL | Status: AC

## 2023-04-21 MED ORDER — ONDANSETRON HCL 4 MG/2ML IJ SOLN
INTRAMUSCULAR | Status: AC
  Filled 2023-04-21: qty 2

## 2023-04-21 MED ORDER — HYDROMORPHONE HCL 1 MG/ML IJ SOLN
1.0000 mg | INTRAMUSCULAR | Status: AC | PRN
  Administered 2023-04-21 (×3): 1 mg via INTRAVENOUS
  Filled 2023-04-21 (×4): qty 1

## 2023-04-21 MED ORDER — FENTANYL CITRATE (PF) 250 MCG/5ML IJ SOLN
INTRAMUSCULAR | Status: AC
  Filled 2023-04-21: qty 5

## 2023-04-21 MED ORDER — LACTATED RINGERS IV SOLN: INTRAVENOUS | Status: DC

## 2023-04-21 MED ORDER — MORPHINE SULFATE (PF) 2 MG/ML IV SOLN: 1.0000 mg | INTRAVENOUS | Status: DC | PRN

## 2023-04-21 MED ORDER — SUGAMMADEX SODIUM 200 MG/2ML IV SOLN
INTRAVENOUS | Status: DC | PRN
  Administered 2023-04-21: 200 mg via INTRAVENOUS

## 2023-04-21 MED ORDER — ALBUTEROL SULFATE (2.5 MG/3ML) 0.083% IN NEBU: 2.5000 mg | INHALATION_SOLUTION | Freq: Four times a day (QID) | RESPIRATORY_TRACT | Status: DC | PRN

## 2023-04-21 MED ORDER — POLYETHYLENE GLYCOL 3350 17 G PO PACK: 17.0000 g | PACK | Freq: Every day | ORAL | Status: DC | PRN

## 2023-04-21 MED ORDER — KETAMINE HCL 50 MG/5ML IJ SOSY
PREFILLED_SYRINGE | INTRAMUSCULAR | Status: AC
  Filled 2023-04-21: qty 5

## 2023-04-21 MED ORDER — HYDROMORPHONE HCL 1 MG/ML IJ SOLN
INTRAMUSCULAR | Status: AC
  Administered 2023-04-21: 0.5 mg via INTRAVENOUS
  Filled 2023-04-21: qty 1

## 2023-04-21 MED ORDER — BUPIVACAINE LIPOSOME 1.3 % IJ SUSP
INTRAMUSCULAR | Status: DC | PRN
Start: 2023-04-21 — End: 2023-04-21
  Administered 2023-04-21: 20 mL

## 2023-04-21 MED ORDER — NICOTINE 21 MG/24HR TD PT24
21.0000 mg | MEDICATED_PATCH | Freq: Every day | TRANSDERMAL | Status: DC
  Administered 2023-04-21 – 2023-04-23 (×3): 21 mg via TRANSDERMAL
  Filled 2023-04-21 (×3): qty 1

## 2023-04-21 MED ORDER — DULOXETINE HCL 20 MG PO CPEP
20.0000 mg | ORAL_CAPSULE | Freq: Two times a day (BID) | ORAL | Status: DC
  Administered 2023-04-21 – 2023-04-23 (×4): 20 mg via ORAL
  Filled 2023-04-21 (×6): qty 1

## 2023-04-21 MED ORDER — LORAZEPAM 2 MG/ML IJ SOLN: 1.0000 mg | INTRAMUSCULAR | Status: DC | PRN

## 2023-04-21 MED ORDER — CIPROFLOXACIN IN D5W 400 MG/200ML IV SOLN
400.0000 mg | INTRAVENOUS | Status: AC
  Administered 2023-04-21: 400 mg via INTRAVENOUS
  Filled 2023-04-21: qty 200

## 2023-04-21 MED ORDER — BUPIVACAINE LIPOSOME 1.3 % IJ SUSP
INTRAMUSCULAR | Status: AC
  Filled 2023-04-21: qty 20

## 2023-04-21 MED ORDER — METOCLOPRAMIDE HCL 5 MG/ML IJ SOLN
5.0000 mg | Freq: Three times a day (TID) | INTRAMUSCULAR | Status: DC
  Administered 2023-04-21 – 2023-04-23 (×6): 5 mg via INTRAVENOUS
  Filled 2023-04-21 (×6): qty 2

## 2023-04-21 MED ORDER — ACETAMINOPHEN 10 MG/ML IV SOLN
INTRAVENOUS | Status: AC
  Administered 2023-04-21: 1000 mg
  Filled 2023-04-21: qty 100

## 2023-04-21 MED ORDER — SODIUM CHLORIDE 0.9 % IV SOLN: INTRAVENOUS | Status: DC

## 2023-04-21 MED ORDER — CHLORHEXIDINE GLUCONATE 0.12 % MT SOLN
OROMUCOSAL | Status: AC
  Administered 2023-04-21: 15 mL via OROMUCOSAL
  Filled 2023-04-21: qty 15

## 2023-04-21 MED ORDER — GLYCOPYRROLATE 0.2 MG/ML IJ SOLN
INTRAMUSCULAR | Status: DC | PRN
Start: 2023-04-21 — End: 2023-04-21
  Administered 2023-04-21: .2 mg via INTRAVENOUS

## 2023-04-21 MED ORDER — PROPOFOL 10 MG/ML IV BOLUS
INTRAVENOUS | Status: DC | PRN
  Administered 2023-04-21: 200 mg via INTRAVENOUS

## 2023-04-21 MED ORDER — OXYCODONE HCL 5 MG PO TABS
5.0000 mg | ORAL_TABLET | Freq: Four times a day (QID) | ORAL | Status: DC | PRN
  Administered 2023-04-21: 5 mg via ORAL
  Filled 2023-04-21: qty 1

## 2023-04-21 MED ORDER — 0.9 % SODIUM CHLORIDE (POUR BTL) OPTIME
TOPICAL | Status: DC | PRN
Start: 2023-04-21 — End: 2023-04-21
  Administered 2023-04-21: 2000 mL

## 2023-04-21 MED ORDER — KETAMINE HCL 10 MG/ML IJ SOLN
INTRAMUSCULAR | Status: DC | PRN
Start: 2023-04-21 — End: 2023-04-21
  Administered 2023-04-21 (×2): 20 mg via INTRAVENOUS
  Administered 2023-04-21: 10 mg via INTRAVENOUS

## 2023-04-21 MED ORDER — LORAZEPAM 1 MG PO TABS: 1.0000 mg | ORAL_TABLET | ORAL | Status: DC | PRN

## 2023-04-21 MED ORDER — ROCURONIUM BROMIDE 10 MG/ML (PF) SYRINGE
PREFILLED_SYRINGE | INTRAVENOUS | Status: DC | PRN
  Administered 2023-04-21 (×3): 20 mg via INTRAVENOUS
  Administered 2023-04-21: 30 mg via INTRAVENOUS

## 2023-04-21 MED ORDER — HYDROMORPHONE HCL 1 MG/ML IJ SOLN
INTRAMUSCULAR | Status: AC
  Filled 2023-04-21: qty 1

## 2023-04-21 MED ORDER — OXYCODONE HCL 5 MG/5ML PO SOLN: 5.0000 mg | Freq: Once | ORAL | Status: DC | PRN

## 2023-04-21 MED ORDER — VASOPRESSIN 20 UNIT/ML IV SOLN
INTRAVENOUS | Status: AC
  Filled 2023-04-21: qty 1

## 2023-04-21 MED ORDER — CHLORDIAZEPOXIDE HCL 5 MG PO CAPS
10.0000 mg | ORAL_CAPSULE | Freq: Three times a day (TID) | ORAL | Status: DC
  Administered 2023-04-22 – 2023-04-23 (×4): 10 mg via ORAL
  Filled 2023-04-21 (×5): qty 2

## 2023-04-21 MED ORDER — THIAMINE MONONITRATE 100 MG PO TABS
100.0000 mg | ORAL_TABLET | Freq: Every day | ORAL | Status: DC
  Administered 2023-04-22 – 2023-04-23 (×2): 100 mg via ORAL
  Filled 2023-04-21 (×2): qty 1

## 2023-04-21 MED ORDER — ALBUMIN HUMAN 5 % IV SOLN
INTRAVENOUS | Status: DC | PRN
Start: 2023-04-21 — End: 2023-04-21

## 2023-04-21 MED ORDER — POTASSIUM CHLORIDE 10 MEQ/100ML IV SOLN
10.0000 meq | INTRAVENOUS | Status: AC
  Administered 2023-04-21 (×2): 10 meq via INTRAVENOUS
  Filled 2023-04-21 (×2): qty 100

## 2023-04-21 MED ORDER — VISTASEAL 10 ML SINGLE DOSE KIT
30.0000 mL | PACK | Freq: Once | CUTANEOUS | Status: AC
  Administered 2023-04-21: 30 mL via TOPICAL
  Filled 2023-04-21: qty 30

## 2023-04-21 MED ORDER — CALCIUM CHLORIDE 10 % IV SOLN
INTRAVENOUS | Status: DC | PRN
Start: 2023-04-21 — End: 2023-04-21
  Administered 2023-04-21 (×4): 250 mg via INTRAVENOUS

## 2023-04-21 MED ORDER — PHENYLEPHRINE HCL-NACL 20-0.9 MG/250ML-% IV SOLN
INTRAVENOUS | Status: DC | PRN
  Administered 2023-04-21: 50 ug/min via INTRAVENOUS

## 2023-04-21 MED ORDER — SUCCINYLCHOLINE CHLORIDE 200 MG/10ML IV SOSY
PREFILLED_SYRINGE | INTRAVENOUS | Status: DC | PRN
  Administered 2023-04-21: 120 mg via INTRAVENOUS

## 2023-04-21 MED ORDER — MAGNESIUM SULFATE IN D5W 1-5 GM/100ML-% IV SOLN
1.0000 g | Freq: Once | INTRAVENOUS | Status: AC
  Administered 2023-04-21 (×2): 1 g via INTRAVENOUS
  Filled 2023-04-21: qty 100

## 2023-04-21 MED ORDER — MIDAZOLAM HCL 2 MG/2ML IJ SOLN
INTRAMUSCULAR | Status: AC
  Filled 2023-04-21: qty 2

## 2023-04-21 MED ORDER — DEXAMETHASONE SODIUM PHOSPHATE 10 MG/ML IJ SOLN
INTRAMUSCULAR | Status: AC
  Filled 2023-04-21: qty 1

## 2023-04-21 MED ORDER — ORAL CARE MOUTH RINSE: 15.0000 mL | Freq: Once | OROMUCOSAL | Status: AC

## 2023-04-21 MED ORDER — THIAMINE HCL 100 MG/ML IJ SOLN: 100.0000 mg | Freq: Every day | INTRAMUSCULAR | Status: DC

## 2023-04-21 MED ORDER — OXYCODONE HCL 5 MG PO TABS: 5.0000 mg | ORAL_TABLET | Freq: Once | ORAL | Status: DC | PRN

## 2023-04-21 MED ORDER — ACETAMINOPHEN 10 MG/ML IV SOLN
1000.0000 mg | Freq: Four times a day (QID) | INTRAVENOUS | Status: AC
  Administered 2023-04-21 – 2023-04-22 (×4): 1000 mg via INTRAVENOUS
  Filled 2023-04-21 (×4): qty 100

## 2023-04-21 MED ORDER — SODIUM CHLORIDE 0.9 % IV BOLUS
500.0000 mL | Freq: Once | INTRAVENOUS | Status: AC
  Administered 2023-04-21: 500 mL via INTRAVENOUS

## 2023-04-21 MED ORDER — METHOCARBAMOL 1000 MG/10ML IJ SOLN
500.0000 mg | Freq: Three times a day (TID) | INTRAVENOUS | Status: DC
  Administered 2023-04-22: 500 mg via INTRAVENOUS
  Filled 2023-04-21: qty 500
  Filled 2023-04-21 (×4): qty 5

## 2023-04-21 MED ORDER — PROCHLORPERAZINE EDISYLATE 10 MG/2ML IJ SOLN
5.0000 mg | Freq: Four times a day (QID) | INTRAMUSCULAR | Status: DC | PRN
  Administered 2023-04-21 – 2023-04-23 (×5): 5 mg via INTRAVENOUS
  Filled 2023-04-21 (×6): qty 2

## 2023-04-21 MED ORDER — HYDROMORPHONE HCL 1 MG/ML IJ SOLN: 0.5000 mg | INTRAMUSCULAR | Status: DC | PRN

## 2023-04-21 MED ORDER — FOLIC ACID 1 MG PO TABS
1.0000 mg | ORAL_TABLET | Freq: Every day | ORAL | Status: DC
  Administered 2023-04-22 – 2023-04-23 (×2): 1 mg via ORAL
  Filled 2023-04-21 (×2): qty 1

## 2023-04-21 MED ORDER — ACETAMINOPHEN 10 MG/ML IV SOLN
1000.0000 mg | Freq: Once | INTRAVENOUS | Status: DC | PRN
  Administered 2023-04-21: 1000 mg via INTRAVENOUS

## 2023-04-21 MED ORDER — MELATONIN 5 MG PO TABS
5.0000 mg | ORAL_TABLET | Freq: Every evening | ORAL | Status: DC | PRN
  Administered 2023-04-21: 5 mg via ORAL
  Filled 2023-04-21: qty 1

## 2023-04-21 MED ORDER — HYDROMORPHONE HCL 1 MG/ML IJ SOLN
0.2500 mg | INTRAMUSCULAR | Status: DC | PRN
  Administered 2023-04-21 (×2): 0.5 mg via INTRAVENOUS

## 2023-04-21 MED ORDER — MIDAZOLAM HCL 2 MG/2ML IJ SOLN
INTRAMUSCULAR | Status: DC | PRN
  Administered 2023-04-21: 2 mg via INTRAVENOUS

## 2023-04-21 MED ORDER — LIDOCAINE 2% (20 MG/ML) 5 ML SYRINGE
INTRAMUSCULAR | Status: AC
  Filled 2023-04-21: qty 5

## 2023-04-21 MED ORDER — FENTANYL CITRATE (PF) 250 MCG/5ML IJ SOLN
INTRAMUSCULAR | Status: DC | PRN
  Administered 2023-04-21 (×3): 50 ug via INTRAVENOUS

## 2023-04-21 MED ORDER — INSULIN ASPART 100 UNIT/ML IJ SOLN
0.0000 [IU] | INTRAMUSCULAR | Status: DC
  Administered 2023-04-21 – 2023-04-22 (×4): 1 [IU] via SUBCUTANEOUS
  Administered 2023-04-22: 2 [IU] via SUBCUTANEOUS
  Administered 2023-04-22 (×3): 1 [IU] via SUBCUTANEOUS

## 2023-04-21 MED ORDER — AMISULPRIDE (ANTIEMETIC) 5 MG/2ML IV SOLN: 10.0000 mg | Freq: Once | INTRAVENOUS | Status: DC | PRN

## 2023-04-21 MED ORDER — PHENYLEPHRINE 80 MCG/ML (10ML) SYRINGE FOR IV PUSH (FOR BLOOD PRESSURE SUPPORT)
PREFILLED_SYRINGE | INTRAVENOUS | Status: DC | PRN
  Administered 2023-04-21: 160 ug via INTRAVENOUS

## 2023-04-21 MED ORDER — ACETAMINOPHEN 10 MG/ML IV SOLN: 1000.0000 mg | Freq: Once | INTRAVENOUS | Status: DC

## 2023-04-21 MED ORDER — OXIDIZED CELLULOSE EX PADS
MEDICATED_PAD | CUTANEOUS | Status: DC | PRN
  Administered 2023-04-21: 8 via TOPICAL

## 2023-04-21 MED ORDER — DEXAMETHASONE SODIUM PHOSPHATE 10 MG/ML IJ SOLN
INTRAMUSCULAR | Status: DC | PRN
  Administered 2023-04-21: 10 mg via INTRAVENOUS

## 2023-04-21 MED ORDER — ONDANSETRON HCL 4 MG/2ML IJ SOLN
INTRAMUSCULAR | Status: DC | PRN
  Administered 2023-04-21: 4 mg via INTRAVENOUS

## 2023-04-21 MED ORDER — ADULT MULTIVITAMIN W/MINERALS CH
1.0000 | ORAL_TABLET | Freq: Every day | ORAL | Status: DC
  Administered 2023-04-22 – 2023-04-23 (×2): 1 via ORAL
  Filled 2023-04-21 (×2): qty 1

## 2023-04-21 MED ORDER — ROCURONIUM BROMIDE 10 MG/ML (PF) SYRINGE
PREFILLED_SYRINGE | INTRAVENOUS | Status: AC
  Filled 2023-04-21: qty 10

## 2023-04-21 MED ORDER — PROPOFOL 10 MG/ML IV BOLUS
INTRAVENOUS | Status: AC
  Filled 2023-04-21: qty 20

## 2023-04-21 SURGICAL SUPPLY — 60 items
APL PRP STRL LF DISP 70% ISPRP (MISCELLANEOUS) ×1
APPLIER CLIP 11 MED OPEN (CLIP) ×1
APPLIER CLIP 13 LRG OPEN (CLIP)
APR CLP LRG 13 20 CLIP (CLIP)
APR CLP MED 11 20 MLT OPN (CLIP) ×1
BAG COUNTER SPONGE SURGICOUNT (BAG) ×1 IMPLANT
BAG SPNG CNTER NS LX DISP (BAG) ×1
BLADE CLIPPER SURG (BLADE) IMPLANT
CANISTER SUCT 3000ML PPV (MISCELLANEOUS) ×1 IMPLANT
CHLORAPREP W/TINT 26 (MISCELLANEOUS) ×1 IMPLANT
CLIP APPLIE 11 MED OPEN (CLIP) IMPLANT
CLIP APPLIE 13 LRG OPEN (CLIP) IMPLANT
CLIP TI MEDIUM 6 (CLIP) IMPLANT
COVER SURGICAL LIGHT HANDLE (MISCELLANEOUS) ×1 IMPLANT
DRAIN CHANNEL 19F RND (DRAIN) IMPLANT
DRAPE LAPAROSCOPIC ABDOMINAL (DRAPES) ×1 IMPLANT
DRAPE WARM FLUID 44X44 (DRAPES) IMPLANT
ELECT BLADE 6.5 EXT (BLADE) ×1 IMPLANT
ELECT REM PT RETURN 9FT ADLT (ELECTROSURGICAL) ×1
ELECTRODE REM PT RTRN 9FT ADLT (ELECTROSURGICAL) ×1 IMPLANT
EVACUATOR SILICONE 100CC (DRAIN) IMPLANT
GAUZE SPONGE 4X4 12PLY STRL (GAUZE/BANDAGES/DRESSINGS) ×1 IMPLANT
GLOVE BIO SURGEON STRL SZ8 (GLOVE) ×1 IMPLANT
GLOVE BIOGEL PI IND STRL 6.5 (GLOVE) IMPLANT
GLOVE BIOGEL PI IND STRL 8 (GLOVE) ×1 IMPLANT
GOWN STRL REUS W/ TWL LRG LVL3 (GOWN DISPOSABLE) ×3 IMPLANT
GOWN STRL REUS W/ TWL XL LVL3 (GOWN DISPOSABLE) ×1 IMPLANT
GOWN STRL REUS W/TWL LRG LVL3 (GOWN DISPOSABLE) ×3
GOWN STRL REUS W/TWL XL LVL3 (GOWN DISPOSABLE) ×1
HANDLE SUCTION POOLE (INSTRUMENTS) ×1 IMPLANT
HEMOSTAT ARISTA ABSORB 3G PWDR (HEMOSTASIS) IMPLANT
HEMOSTAT SNOW SURGICEL 2X4 (HEMOSTASIS) IMPLANT
KIT BASIN OR (CUSTOM PROCEDURE TRAY) ×1 IMPLANT
KIT TURNOVER KIT B (KITS) ×1 IMPLANT
LIGASURE IMPACT 36 18CM CVD LR (INSTRUMENTS) ×1 IMPLANT
NS IRRIG 1000ML POUR BTL (IV SOLUTION) ×2 IMPLANT
PACK GENERAL/GYN (CUSTOM PROCEDURE TRAY) ×1 IMPLANT
PAD ARMBOARD 7.5X6 YLW CONV (MISCELLANEOUS) ×1 IMPLANT
PENCIL SMOKE EVACUATOR (MISCELLANEOUS) ×1 IMPLANT
RELOAD 45 VASCULAR/THIN (ENDOMECHANICALS) ×5 IMPLANT
RELOAD STAPLE 45 2.5 WHT GRN (ENDOMECHANICALS) IMPLANT
SPECIMEN JAR X LARGE (MISCELLANEOUS) ×1 IMPLANT
SPONGE INTESTINAL PEANUT (DISPOSABLE) IMPLANT
SPONGE SURGIFOAM ABS GEL 100 (HEMOSTASIS) IMPLANT
SPONGE T-LAP 18X18 ~~LOC~~+RFID (SPONGE) IMPLANT
STAPLER VISISTAT 35W (STAPLE) ×1 IMPLANT
SUCTION POOLE HANDLE (INSTRUMENTS) ×1
SUT PDS AB 1 CT 36 (SUTURE) IMPLANT
SUT PDS AB 1 TP1 96 (SUTURE) ×2 IMPLANT
SUT SILK 0 TIES 10X30 (SUTURE) ×1 IMPLANT
SUT SILK 2 0 SH (SUTURE) ×2 IMPLANT
SUT SILK 2 0 TIES 10X30 (SUTURE) ×1 IMPLANT
SUT SILK 2 0SH CR/8 30 (SUTURE) ×1 IMPLANT
SUT VIC AB 2-0 CT1 27 (SUTURE) ×1
SUT VIC AB 2-0 CT1 TAPERPNT 27 (SUTURE) ×1 IMPLANT
SUT VIC AB 2-0 SH 18 (SUTURE) IMPLANT
SUT VIC AB 3-0 SH 18 (SUTURE) ×1 IMPLANT
TOWEL GREEN STERILE (TOWEL DISPOSABLE) ×1 IMPLANT
TOWEL GREEN STERILE FF (TOWEL DISPOSABLE) ×1 IMPLANT
TRAY FOLEY MTR SLVR 14FR STAT (SET/KITS/TRAYS/PACK) ×1 IMPLANT

## 2023-04-21 NOTE — Progress Notes (Signed)
At about 2010 patient complaint of pain rated 7/10 and demanded for his dilaudid.  This RN explained to patient that dilaudid was discontinued and he stated "I just had surgery somebody will have to give me pain medicine".  Patient was educated about his low BP and the need to be cautious with pain medication.  He insisted and sated "are you going to call the Dr. Or not because something need to be done about my pain medicine".  This RN informed patient that the matter will be made known to MD.  Dr. Janalyn Shy was notified and an order for 500 ml bolus and morphine PRN received.  Bolus is given and BP post bolus is 94/55.  Scheduled robaxin and librium is due to be given.  Dr. Janalyn Shy is  notified about the post bolus BP and the scheduled med and an instruction to hold robaxin and librium received.  Patient is resting comfortably at the moment.  Will continue to monitor.

## 2023-04-21 NOTE — Progress Notes (Signed)
Patient sent to Pre-op with upper dentures in place. Darreld Mclean, RN- floor nurse notified by Lurena Nida, RN to come and retreive dentures and take it back to the patient's room.

## 2023-04-21 NOTE — Progress Notes (Signed)
Called Darreld Mclean, RN again to notify her that the patient's upper denture plate is still sitting in a labeled pink cup at the main desk in Surgical Short Stay and that someone needs to come and pick it up. Annabelle Harman stated that someone will be down shortly.

## 2023-04-21 NOTE — Progress Notes (Addendum)
  Patient's bedside nurse reported that he is experiencing pain 7 out of 10 in's intensity.  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 Patient's blood pressure is low 97/61. -Giving NS bolus 500 cc stat.  Also patient is currently on running NS 50 cc/h for 1 day for management of hyponatremia. -Ordered morphine 1 mg every 4 hour as needed for moderate and severe pain.  Tereasa Coop, MD [facility] 04/21/2023, 8:42 PM

## 2023-04-21 NOTE — Consult Note (Signed)
Samuel Abbott 10-12-1960  [id number].    Requesting MD: Dr. 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  His hemoglobin remained stable throughout the stay and he discharged without intervention.  He reports that since discharge he has continued to have significant LUQ abdominal pain with radiation to his shoulder. 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  He was transferred to [facility] and arrived in stable condition.  Hb 9.8 (was 9.2 on 9/27).  MAPs 70s-80s and HR 70s.    On exam, patient resting comfortably. NAD.  He reports that his last drink was last week.   ROS: Review of Systems  Constitutional: Negative.   HENT: Negative.    Eyes: Negative.   Respiratory: Negative.    Cardiovascular: Negative.   Gastrointestinal:  Positive for abdominal pain.  Genitourinary: Negative.   Musculoskeletal: Negative.   Skin: Negative.   Neurological: Negative.   Endo/Heme/Allergies: Negative.   Psychiatric/Behavioral: Negative.      Family History  Problem Relation Age of Onset   CVA Father     Past Medical History:  Diagnosis Date   Acute pancreatitis    Alcohol abuse    Eczema    High cholesterol    Hypertension    Tobacco abuse     Past Surgical History:  Procedure Laterality Date   WRIST SURGERY      Social History:  reports that he has been smoking cigarettes. He has a 60 pack-year smoking history. He uses smokeless tobacco. He reports that he does not currently use alcohol after a past usage of about 28.0 standard drinks of alcohol per week. He reports that he does not currently use drugs.  Allergies:  Allergies  Allergen Reactions   Amoxicillin  Anaphylaxis   Penicillins Anaphylaxis    Medications Prior to Admission  Medication Sig Dispense Refill   atorvastatin (LIPITOR) 40 MG tablet Take 1 tablet (40 mg total) by mouth daily. 30 tablet 1   DULoxetine (CYMBALTA) 20 MG capsule Take 20 mg by mouth 2 (two) times daily.     hydrOXYzine (VISTARIL) 25 MG capsule Take 25 mg by mouth at bedtime.     losartan (COZAAR) 25 MG tablet Take 25 mg by mouth daily.     metFORMIN (GLUCOPHAGE) 500 MG tablet Take 1 tablet (500 mg total) by mouth daily with breakfast. 30 tablet 0   Multiple Vitamin (MULTIVITAMIN) capsule Take 1 capsule by mouth daily.     nicotine (NICODERM CQ - DOSED IN MG/24 HOURS) 21 mg/24hr patch Place 1 patch (21 mg total) onto the skin daily. (Patient not taking: Reported on 04/07/2023) 28 patch 0   ondansetron (ZOFRAN-ODT) 4 MG disintegrating tablet Take 1 tablet (4 mg total) by mouth every 8 (eight) hours as needed for nausea or vomiting. 20 tablet 0   oxyCODONE-acetaminophen (PERCOCET/ROXICET) 5-325 MG tablet Take 1-2 tablets by mouth every 6 (six) hours as needed for severe pain. 25 tablet 0   polyethylene glycol (MIRALAX / GLYCOLAX) 17 g packet Take 17 g by mouth daily. 14 each 0   senna-docusate (SENOKOT-S) 8.6-50 MG tablet Take 2 tablets by mouth at bedtime. 30 tablet 0   tiZANidine (ZANAFLEX) 4 MG tablet  Take 4 mg by mouth every 8 (eight) hours as needed for muscle spasms.      Physical Exam: Blood pressure 117/70, pulse 82, temperature 98.6 F (37 C), temperature source Axillary, resp. rate 14, height 6' (1.829 m), weight 97.7 kg, SpO2 98%. Gen: adult male, NAD Resp: equal chest rise CV: RRR, HR 70s Abd: soft, non-distended, TTP in the LUQ w/ guarding, no rebound, no peritoneal signs Neuro: moving all extremities  Results for orders placed or performed during the hospital encounter of 04/20/23 (from the past 48 hour(s))  Comprehensive metabolic panel     Status: Abnormal   Collection Time: 04/20/23 12:45 PM  Result  Value Ref Range   Sodium 132 (L) 135 - 145 mmol/L   Potassium 3.5 3.5 - 5.1 mmol/L   Chloride 97 (L) 98 - 111 mmol/L   CO2 23 22 - 32 mmol/L   Glucose, Bld 249 (H) 70 - 99 mg/dL    Comment: Glucose reference range applies only to samples taken after fasting for at least 8 hours.   BUN 14 6 - 20 mg/dL   Creatinine, Ser 1.61 0.61 - 1.24 mg/dL   Calcium 8.8 (L) 8.9 - 10.3 mg/dL   Total Protein 6.9 6.5 - 8.1 g/dL   Albumin 3.2 (L) 3.5 - 5.0 g/dL   AST 36 15 - 41 U/L   ALT 99 (H) 0 - 44 U/L   Alkaline Phosphatase 117 38 - 126 U/L   Total Bilirubin 0.5 0.3 - 1.2 mg/dL   GFR, Estimated >09 >60 mL/min    Comment: (NOTE) Calculated using the CKD-EPI Creatinine Equation (2021)    Anion gap 12 5 - 15    Comment: Performed at [facility], [address] [place], [place] [address]  CBC     Status: Abnormal   Collection Time: 04/20/23 12:45 PM  Result Value Ref Range   WBC 15.1 (H) 4.0 - 10.5 K/uL   RBC 3.42 (L) 4.22 - 5.81 MIL/uL   Hemoglobin 10.2 (L) 13.0 - 17.0 g/dL   HCT 81.1 (L) 91.4 - 78.2 %   MCV 90.6 80.0 - 100.0 fL   MCH 29.8 26.0 - 34.0 pg   MCHC 32.9 30.0 - 36.0 g/dL   RDW 95.6 21.3 - 08.6 %   Platelets 463 (H) 150 - 400 K/uL   nRBC 0.0 0.0 - 0.2 %    Comment: Performed at [facility], [address] [place], [place] [address]  Lipase, blood     Status: Abnormal   Collection Time: 04/20/23  2:56 PM  Result Value Ref Range   Lipase 104 (H) 11 - 51 U/L    Comment: Performed at [facility], [address] [place], [place] [address]  Troponin I (High Sensitivity)     Status: None   Collection Time: 04/20/23  2:56 PM  Result Value Ref Range   Troponin I (High Sensitivity) 3 <18 ng/L    Comment: (NOTE) Elevated high sensitivity troponin I (hsTnI) values and significant  changes across serial measurements may suggest ACS but many other  chronic and acute conditions are known to elevate hsTnI results.  Refer to the "Links" section for chest  pain algorithms and additional  guidance. Performed at [facility], [address] [place], [place] [address]   Hemoglobin and hematocrit, blood     Status: Abnormal   Collection Time: 04/21/23  1:10 AM  Result Value Ref Range  Hemoglobin 9.8 (L) 13.0 - 17.0 g/dL   HCT 32.4 (L) 40.1 - 02.7 %    Comment: Performed at [facility], [address] [place], [place] [address]  Type and screen [facility]     Status: None   Collection Time: 04/21/23  1:10 AM  Result Value Ref Range   ABO/RH(D) O POS    Antibody Screen NEG    Sample Expiration      04/24/2023,2359 Performed at [facility], [address] [place], [place] [address]   Protime-INR     Status: None   Collection Time: 04/21/23  1:22 AM  Result Value Ref Range   Prothrombin Time 13.9 11.4 - 15.2 seconds   INR 1.1 0.8 - 1.2    Comment: (NOTE) INR goal varies based on device and disease states. Performed at [facility], [address], [place], [place] [address]    CT Angio Chest/Abd/Pel for Dissection W and/or Wo Contrast  Result Date: 04/20/2023 CLINICAL DATA:  Left flank pain radiating to left shoulder. History of perisplenic hematoma. EXAM: CT ANGIOGRAPHY CHEST, ABDOMEN AND PELVIS TECHNIQUE: Non-contrast CT of the chest was initially obtained. Multidetector CT imaging through the chest, abdomen and pelvis was performed using the standard protocol during bolus administration of intravenous contrast. Multiplanar reconstructed images and MIPs were obtained and reviewed to evaluate the vascular anatomy. RADIATION DOSE REDUCTION: This exam was performed according to the departmental dose-optimization program which includes automated exposure control, adjustment of the mA and/or kV according to patient size and/or use of iterative reconstruction technique. CONTRAST:  OMNIPAQUE IOHEXOL 350 MG/ML SOLN COMPARISON:  04/07/2023 FINDINGS: CTA CHEST FINDINGS Cardiovascular: Heart is  normal size. Aorta is normal caliber. No dissection No filling defects in the pulmonary arteries to suggest pulmonary emboli. Mediastinum/Nodes: No mediastinal, hilar, or axillary adenopathy. Trachea and esophagus are unremarkable. Thyroid unremarkable. Lungs/Pleura: Biapical scarring. Trace left pleural effusion. No confluent opacities or pneumothorax. Musculoskeletal: Chest wall soft tissues are unremarkable. No acute bony abnormality. Review of the MIP images confirms the above findings. CTA ABDOMEN AND PELVIS FINDINGS VASCULAR Aorta: Normal caliber aorta without aneurysm, dissection, vasculitis or significant stenosis. Celiac: Patent. 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 This is concerning for the source of the perisplenic hematoma. SMA: Patent without evidence of aneurysm, dissection, vasculitis or significant stenosis. Renals: Both renal arteries are patent without evidence of aneurysm, dissection, vasculitis, fibromuscular dysplasia or significant stenosis. IMA: Patent without evidence of aneurysm, dissection, vasculitis or significant stenosis. Inflow: Patent without evidence of aneurysm, dissection, vasculitis or significant stenosis. Veins: No obvious venous abnormality within the limitations of this arterial phase study. Review of the MIP images confirms the above findings. NON-VASCULAR Hepatobiliary: No focal hepatic abnormality. Gallbladder unremarkable. Pancreas: Calcifications throughout the pancreas compatible with chronic pancreatitis. No evidence of acute pancreatitis or ductal dilatation. Spleen: Large perisplenic hematoma again noted, measuring approximately 13.5 x 6.2 cm. With measured in the same planes and at the same level on prior study, this measured approximately 13 x 4.4 cm. This is felt to be slightly larger than prior study. Adrenals/Urinary Tract: No adrenal abnormality. No focal  renal abnormality. No stones or hydronephrosis. Urinary bladder is unremarkable. Stomach/Bowel: Normal appendix. Stomach, large and small bowel grossly unremarkable. Lymphatic: No adenopathy Reproductive: No visible focal abnormality. Other: No free fluid or free air. Musculoskeletal: No acute bony abnormality. Review  of the MIP images confirms the above findings. IMPRESSION: 1.5 cm rounded enhancing hypervascular area adjacent to the splenic artery in the region of the splenic hilum adjacent to the pancreatic tail. 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 Large perisplenic hematoma, 13.5 x 6.2 cm, slightly increased in size since prior study. These results were called by telephone at the time of interpretation on 04/20/2023 at 4:33 pm to provider [facility] , who verbally acknowledged these results. Electronically Signed   By: Charlett Nose M.D.   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  I discussed his case with Dr. Bryn Gulling from IR.  He might be able to provide a temporizing measure through embolization but would be unlikely to offer definitive control given the concern for back bleeding. There is no indication for emergent splenectomy at this time.  His collection is relatively stable from his prior study on 9/25 and he is HDS with a stable Hb.   - Recommend monitoring him in a controlled setting with heart monitoring and frequent blood pressure checks - Maintain reliable IV access including at least 2 large bore IVs - NPO with MIVF - Q6 H&H - I will discuss definitive management with the rounding team but will remain available to assess for more urgent intervention should there be any  clinical changes tonight.  Please page me with any questions or concerns.  Secure chat is not a reliable method for reaching me to discuss urgent patient issues.  I reviewed last 24 h vitals and pain scores, last 24 h labs and trends, and last 24 h imaging results.  T[facility] 04/21/2023, 2:29 AM Please see Amion for pager number during day hours 7:00am-4:30pm or 7:00am -11:30am on weekends

## 2023-04-21 NOTE — Progress Notes (Signed)
PROGRESS NOTE                                                                                                                                                                                                             Patient Demographics:    Samuel Abbott, is a 62 y.o. male, DOB - [birth year], ZOX:[id number]  Outpatient Primary MD for the patient is Norm Salt, [place]    LOS - 1  Admit date - 04/20/2023    Chief Complaint  Patient presents with   Flank Pain   Shoulder Pain       Brief Narrative (HPI from H&P)   62 y.o. male with medical history significant for hypertension, hyperlipidemia, alcohol and tobacco use, history of chronic pancreatitis, pancreatic pseudocyst, recent admission (9/25-9/27/24) for contained spontaneous splenic rupture with hematoma, who initially presented to [facility] ED with complaints of left upper abdomen and left flank pain.  His symptoms have been progressively worsening for the past 3 to 4 days.  Denies any subjective fevers or chills.  Further workup showed continued extravasation of blood into the spleen possibly from a neighboring aneurysm.  General surgery was consulted and he was admitted to the hospital.   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  With some blood loss related anemia, monitoring CBC, bowel rest, IV fluids, maintain 2 large IVs, general surgery on board scheduled for possible splenectomy on 04/21/2023.  Continue to monitor.  Patient agreeable for blood transfusion if needed, type screen has been done.   Mild hypovolemic hyponatremia Serum sodium 132 Gentle IV hydration  NS at 50 cc/h x 1 day.   Chronic anxiety/depression Resume home regimen.   Essential hypertension BPs are soft Hold off on oral antihypertensives Maintain MAP greater than 65   Chronic pancreatitis Lipase 104 Continue to monitor for symptomatology Continue to avoid alcohol use   History of alcohol abuse and smoking No evidence of alcohol withdrawal at the time of this visit.  CIWA protocol.  Drinks 3-4 bottles of beer a day  and says he never goes into withdrawal.  Counseled to quit all, NicoDerm patch added.   QTc prolongation Resolved after electrolyte replacement     Type 2 diabetes with hyperglycemia   Start insulin sliding scale every 4 hours while NPO  CBG (last 3)  Recent Labs    04/21/23 0357 04/21/23 0829  GLUCAP 134* 130*   Lab Results  Component Value Date   HGBA1C 7.0 (H) 04/21/2023           Condition - Fair  Family Communication  :  None  Code Status :  Full  Consults  :  CCS  PUD Prophylaxis :    Procedures  :     CT - 1.5 cm rounded enhancing hypervascular area adjacent to the splenic artery in the region of the splenic hilum adjacent to the pancreatic tail. 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 Large perisplenic hematoma, 13.5 x 6.2 cm, slightly increased in size since prior study      Disposition Plan  :    Status is: Inpatient   DVT Prophylaxis  :    SCDs Start: 04/21/23 0101    Lab Results  Component Value Date   PLT 403 (H) 04/21/2023    Diet :  Diet Order             Diet NPO time specified Except for: Sips with Meds  Diet effective now                    Inpatient Medications  Scheduled Meds:  DULoxetine  20 mg Oral BID   insulin aspart  0-9 Units Subcutaneous Q4H   metoCLOPramide (REGLAN) injection  5 mg Intravenous Q8H   nicotine  21 mg Transdermal Daily   Continuous Infusions:  sodium chloride 50 mL/hr at 04/21/23 0207   ciprofloxacin     magnesium sulfate bolus IVPB      potassium chloride 10 mEq (04/21/23 0816)   PRN Meds:.albuterol, HYDROmorphone (DILAUDID) injection, melatonin, oxyCODONE, polyethylene glycol, prochlorperazine  Antibiotics  :    Anti-infectives (From admission, onward)    Start     Dose/Rate Route Frequency Ordered Stop   04/21/23 0945  ciprofloxacin (CIPRO) IVPB 400 mg        400 mg 200 mL/hr over 60 Minutes Intravenous On call to O.R. 04/21/23 0848 04/22/23 0559         Objective:   Vitals:   04/21/23 0204 04/21/23 0311 04/21/23 0400 04/21/23 0524  BP: 127/77 119/63 126/75 132/63  Pulse: 75 70 75 86  Resp: 12 14 18 17   Temp: 98.4 F (36.9 C) 97.6 F (36.4 C) 97.9 F (36.6 C) 97.6 F (36.4 C)  TempSrc: Oral Oral Oral Oral  SpO2: 98% 91% 95% 95%  Weight:      Height:        Wt Readings from Last 3 Encounters:  04/21/23 97.7 kg  04/07/23 75.8 kg  03/23/23 75.8 kg     Intake/Output Summary (Last 24 hours) at 04/21/2023 0852 Last data filed at 04/21/2023 0655 Gross per 24 hour  Intake 1135.03 ml  Output --  Net 1135.03 ml     Physical Exam  Awake Alert, No new F.N deficits, Normal affect .AT,PERRAL Supple Neck, No JVD,   Symmetrical Chest wall movement, Good air movement bilaterally, CTAB RRR,No Gallops,Rubs or new Murmurs,  +ve B.Sounds, Abd Soft, No tenderness,   No Cyanosis, Clubbing or edema     Data Review:  Recent Labs  Lab 04/20/23 1245 04/21/23 0110 04/21/23 0402  WBC 15.1*  --  13.4*  HGB 10.2* 9.8* 8.9*  HCT 31.0* 30.5* 27.5*  PLT 463*  --  403*  MCV 90.6  --  92.9  MCH 29.8  --  30.1  MCHC 32.9  --  32.4  RDW 13.5  --  13.5    Recent Labs  Lab 04/20/23 1245 04/21/23 0122 04/21/23 0354 04/21/23 0402  NA 132*  --   --  132*  K 3.5  --   --  3.6  CL 97*  --   --  99  CO2 23  --   --  25  ANIONGAP 12  --   --  8  GLUCOSE 249*  --   --  134*  BUN 14  --   --  11  CREATININE 0.72  --   --  0.60*  AST 36  --   --  18  ALT 99*  --   --  73*  ALKPHOS 117  --   --  98   BILITOT 0.5  --   --  0.7  ALBUMIN 3.2*  --   --  2.7*  LATICACIDVEN  --   --  0.7  --   INR  --  1.1  --   --   HGBA1C  --   --  7.0*  --   MG  --   --   --  1.9  CALCIUM 8.8*  --   --  8.8*      Recent Labs  Lab 04/20/23 1245 04/21/23 0122 04/21/23 0354 04/21/23 0402  LATICACIDVEN  --   --  0.7  --   INR  --  1.1  --   --   HGBA1C  --   --  7.0*  --   MG  --   --   --  1.9  CALCIUM 8.8*  --   --  8.8*    --------------------------------------------------------------------------------------------------------------- Lab Results  Component Value Date   CHOL 220 (H) 12/03/2022   HDL 33 (L) 12/03/2022   LDLCALC 154 (H) 12/03/2022   TRIG 164 (H) 12/03/2022   CHOLHDL 6.7 12/03/2022    Lab Results  Component Value Date   HGBA1C 7.0 (H) 04/21/2023   No results for input(s): "TSH", "T4TOTAL", "FREET4", "T3FREE", "THYROIDAB" in the last 72 hours. No results for input(s): "VITAMINB12", "FOLATE", "FERRITIN", "TIBC", "IRON", "RETICCTPCT" in the last 72 hours. ------------------------------------------------------------------------------------------------------------------ Cardiac Enzymes No results for input(s): "CKMB", "TROPONINI", "MYOGLOBIN" in the last 168 hours.  Invalid input(s): "CK"  Micro Results No results found for this or any previous visit (from the past 240 hour(s)).  Radiology Reports CT Angio Chest/Abd/Pel for Dissection W and/or Wo Contrast  Result Date: 04/20/2023 CLINICAL DATA:  Left flank pain radiating to left shoulder. History of perisplenic hematoma. EXAM: CT ANGIOGRAPHY CHEST, ABDOMEN AND PELVIS TECHNIQUE: Non-contrast CT of the chest was initially obtained. Multidetector CT imaging through the chest, abdomen and pelvis was performed using the standard protocol during bolus administration of intravenous contrast. Multiplanar reconstructed images and MIPs were obtained and reviewed to evaluate the vascular anatomy. RADIATION DOSE REDUCTION: This exam  was performed according to the departmental dose-optimization program which includes automated exposure control, adjustment of the mA and/or kV according to patient size and/or use of iterative reconstruction technique. CONTRAST:  OMNIPAQUE IOHEXOL 350 MG/ML SOLN COMPARISON:  04/07/2023 FINDINGS: CTA CHEST FINDINGS Cardiovascular: Heart is normal size. Aorta is  normal caliber. No dissection No filling defects in the pulmonary arteries to suggest pulmonary emboli. Mediastinum/Nodes: No mediastinal, hilar, or axillary adenopathy. Trachea and esophagus are unremarkable. Thyroid unremarkable. Lungs/Pleura: Biapical scarring. Trace left pleural effusion. No confluent opacities or pneumothorax. Musculoskeletal: Chest wall soft tissues are unremarkable. No acute bony abnormality. Review of the MIP images confirms the above findings. CTA ABDOMEN AND PELVIS FINDINGS VASCULAR Aorta: Normal caliber aorta without aneurysm, dissection, vasculitis or significant stenosis. Celiac: Patent. 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 This is concerning for the source of the perisplenic hematoma. SMA: Patent without evidence of aneurysm, dissection, vasculitis or significant stenosis. Renals: Both renal arteries are patent without evidence of aneurysm, dissection, vasculitis, fibromuscular dysplasia or significant stenosis. IMA: Patent without evidence of aneurysm, dissection, vasculitis or significant stenosis. Inflow: Patent without evidence of aneurysm, dissection, vasculitis or significant stenosis. Veins: No obvious venous abnormality within the limitations of this arterial phase study. Review of the MIP images confirms the above findings. NON-VASCULAR Hepatobiliary: No focal hepatic abnormality. Gallbladder unremarkable. Pancreas: Calcifications throughout the pancreas compatible with chronic pancreatitis. No evidence of acute  pancreatitis or ductal dilatation. Spleen: Large perisplenic hematoma again noted, measuring approximately 13.5 x 6.2 cm. With measured in the same planes and at the same level on prior study, this measured approximately 13 x 4.4 cm. This is felt to be slightly larger than prior study. Adrenals/Urinary Tract: No adrenal abnormality. No focal renal abnormality. No stones or hydronephrosis. Urinary bladder is unremarkable. Stomach/Bowel: Normal appendix. Stomach, large and small bowel grossly unremarkable. Lymphatic: No adenopathy Reproductive: No visible focal abnormality. Other: No free fluid or free air. Musculoskeletal: No acute bony abnormality. Review of the MIP images confirms the above findings. IMPRESSION: 1.5 cm rounded enhancing hypervascular area adjacent to the splenic artery in the region of the splenic hilum adjacent to the pancreatic tail. 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 Large perisplenic hematoma, 13.5 x 6.2 cm, slightly increased in size since prior study. These results were called by telephone at the time of interpretation on 04/20/2023 at 4:33 pm to provider [facility] , who verbally acknowledged these results. Electronically Signed   By: Charlett Nose M.D.   On: 04/20/2023 16:34      Signature  -   Susa Raring M.D on 04/21/2023 at 8:52 AM   -  To page go to [email or web address]

## 2023-04-21 NOTE — Progress Notes (Signed)
PT Cancellation Note  Patient Details Name: JAIMERE FEUTZ MRN: [id number] DOB: 10-20-1960   Cancelled Treatment:    Reason Eval/Treat Not Completed: Patient at procedure or test/unavailable (PT will continue with attempts.)  Donna Bernard, PT, MPT  Ina Homes 04/21/2023, 10:46 AM

## 2023-04-21 NOTE — Progress Notes (Signed)
Called and spoke to the charge nurse on 5 W to notify her that no one has come down to Short Stay to retrieve the patient's dentures in the labeled pink denture cup. Per the charge nurse they are working on sending someone down to Short Stay shortly. Informed the charge nurse that patient's are not supposed to come to Pre-op with belongings and that someone needs to come pick up the patient's belongings as soon as possible. The charge nurse verbalized understanding.

## 2023-04-21 NOTE — Progress Notes (Signed)
Patient to OR at this time

## 2023-04-21 NOTE — Anesthesia Procedure Notes (Signed)
Procedure Name: Intubation Date/Time: 04/21/2023 11:56 AM  Performed by: Pincus Large, CRNAPre-anesthesia Checklist: Patient identified, Emergency Drugs available, Suction available and Patient being monitored Patient Re-evaluated:Patient Re-evaluated prior to induction Oxygen Delivery Method: Circle System Utilized Preoxygenation: Pre-oxygenation with 100% oxygen Induction Type: IV induction Ventilation: Mask ventilation without difficulty Laryngoscope Size: 4 and Mac Grade View: Grade I Tube type: Oral Tube size: 7.5 mm Number of attempts: 1 Airway Equipment and Method: Stylet and Oral airway Placement Confirmation: ETT inserted through vocal cords under direct vision, positive ETCO2 and breath sounds checked- equal and bilateral Secured at: 23 cm Tube secured with: Tape Dental Injury: Teeth and Oropharynx as per pre-operative assessment

## 2023-04-21 NOTE — H&P (Addendum)
History and Physical  Samuel Abbott ZOX:[id number] DOB: 04/16/1961 DOA: 04/20/2023  Referring physician: Accepted Renford Dills TRH, Hospitalist. PCP: Norm Salt, PA  Outpatient Specialists: GI, pulmonary  Patient coming from: Home through [facility] ED  Chief Complaint: Left upper abdomen and flank pain.  HPI: Samuel Abbott is a 62 y.o. male with medical history significant for hypertension, hyperlipidemia, alcohol and tobacco use, history of chronic pancreatitis, pancreatic pseudocyst, recent admission (9/25-9/27/24) for contained spontaneous splenic rupture with hematoma, who initially presented to [facility] ED with complaints of left upper abdomen and left flank pain.  His symptoms have been progressively worsening for the past 3 to 4 days.  Denies any subjective fevers or chills.  In the ED, afebrile, normotensive.  CT angio chest abdomen and pelvis revealed findings suggestive of leaking aneurysm or pseudoaneurysm, slightly increased large perisplenic hematoma.  EDP discussed the case with general surgery and interventional radiology.  They will see in consultation.  The patient was admitted by [facility], hospitalist service, and transferred to [facility] progressive care unit as inpatient status.  At the time of this visit the patient is alert and oriented x 4.  Complaints of moderate to severe pain in his left upper quadrant abdomen, improved with IV opioid-based analgesics.  Repeat H&H 9.8 from 10.2.  Last systolic blood pressure 117.  Seen by general surgery.  Keep n.p.o. for possible splenectomy on 04/21/2023.  ED Course: Temperature 98.6.  BP 117/70, pulse 82, respiratory 14, saturation 98% on room air.  Lab studies notable for WBC 15.1, hemoglobin 9.8, platelet count 463.  Serum sodium 132, potassium 3.5, glucose 249, lipase 104, troponin 3.  Review of Systems: Review of systems as noted in the HPI. All other systems reviewed and are negative.   Past Medical History:  Diagnosis  Date   Acute pancreatitis    Alcohol abuse    Eczema    High cholesterol    Hypertension    Tobacco abuse    Past Surgical History:  Procedure Laterality Date   WRIST SURGERY      Social History:  reports that he has been smoking cigarettes. He has a 60 pack-year smoking history. He uses smokeless tobacco. He reports that he does not currently use alcohol after a past usage of about 28.0 standard drinks of alcohol per week. He reports that he does not currently use drugs.   Allergies  Allergen Reactions   Amoxicillin Anaphylaxis   Penicillins Anaphylaxis    Family History  Problem Relation Age of Onset   CVA Father       Prior to Admission medications   Medication Sig Start Date End Date Taking? Authorizing Provider  atorvastatin (LIPITOR) 40 MG tablet Take 1 tablet (40 mg total) by mouth daily. 01/25/21   Valetta Close, MD  DULoxetine (CYMBALTA) 20 MG capsule Take 20 mg by mouth 2 (two) times daily. 01/13/21   [provider]  hydrOXYzine (VISTARIL) 25 MG capsule Take 25 mg by mouth at bedtime. 11/29/20   [provider]  losartan (COZAAR) 25 MG tablet Take 25 mg by mouth daily. 09/16/20   [provider]  metFORMIN (GLUCOPHAGE) 500 MG tablet Take 1 tablet (500 mg total) by mouth daily with breakfast. 01/24/21 04/07/23  Jovita Kussmaul, MD  Multiple Vitamin (MULTIVITAMIN) capsule Take 1 capsule by mouth daily.    [provider]  nicotine (NICODERM CQ - DOSED IN MG/24 HOURS) 21 mg/24hr patch Place 1 patch (21 mg total) onto the skin  daily. Patient not taking: Reported on 04/07/2023 01/25/21   Valetta Close, MD  ondansetron (ZOFRAN-ODT) 4 MG disintegrating tablet Take 1 tablet (4 mg total) by mouth every 8 (eight) hours as needed for nausea or vomiting. 12/03/22   Smoot, Shawn Route, PA-C  oxyCODONE-acetaminophen (PERCOCET/ROXICET) 5-325 MG tablet Take 1-2 tablets by mouth every 6 (six) hours as needed for severe pain. 04/09/23   Ghimire, Werner Lean,  MD  polyethylene glycol (MIRALAX / GLYCOLAX) 17 g packet Take 17 g by mouth daily. 04/10/23   Ghimire, Werner Lean, MD  senna-docusate (SENOKOT-S) 8.6-50 MG tablet Take 2 tablets by mouth at bedtime. 04/09/23   Ghimire, Werner Lean, MD  tiZANidine (ZANAFLEX) 4 MG tablet Take 4 mg by mouth every 8 (eight) hours as needed for muscle spasms. 11/29/20   [provider]    Physical Exam: BP 110/75 (BP Location: Left Arm)   Pulse 89   Temp 99 F (37.2 C) (Oral)   Resp 20   Ht 6' (1.829 m)   Wt 97.7 kg   SpO2 97%   BMI 29.21 kg/m   General: 62 y.o. year-old male well developed well nourished in no acute distress.  Alert and oriented x3. Cardiovascular: Regular rate and rhythm with no rubs or gallops.  No thyromegaly or JVD noted.  No lower extremity edema. 2/4 pulses in all 4 extremities. Respiratory: Clear to auscultation with no wheezes or rales. Good inspiratory effort. Abdomen: Soft tender left upper quadrant abdomen.  Nondistended with normal bowel sounds x4 quadrants. Muskuloskeletal: No cyanosis, clubbing or edema noted bilaterally Neuro: CN II-XII intact, strength, sensation, reflexes Skin: No ulcerative lesions noted or rashes Psychiatry: Judgement and insight appear normal. Mood is appropriate for condition and setting          Labs on Admission:  Basic Metabolic Panel: Recent Labs  Lab 04/20/23 1245  NA 132*  K 3.5  CL 97*  CO2 23  GLUCOSE 249*  BUN 14  CREATININE 0.72  CALCIUM 8.8*   Liver Function Tests: Recent Labs  Lab 04/20/23 1245  AST 36  ALT 99*  ALKPHOS 117  BILITOT 0.5  PROT 6.9  ALBUMIN 3.2*   Recent Labs  Lab 04/20/23 1456  LIPASE 104*   No results for input(s): "AMMONIA" in the last 168 hours. CBC: Recent Labs  Lab 04/20/23 1245  WBC 15.1*  HGB 10.2*  HCT 31.0*  MCV 90.6  PLT 463*   Cardiac Enzymes: No results for input(s): "CKTOTAL", "CKMB", "CKMBINDEX", "TROPONINI" in the last 168 hours.  BNP (last 3 results) No results  for input(s): "BNP" in the last 8760 hours.  ProBNP (last 3 results) No results for input(s): "PROBNP" in the last 8760 hours.  CBG: No results for input(s): "GLUCAP" in the last 168 hours.  Radiological Exams on Admission: CT Angio Chest/Abd/Pel for Dissection W and/or Wo Contrast  Result Date: 04/20/2023 CLINICAL DATA:  Left flank pain radiating to left shoulder. History of perisplenic hematoma. EXAM: CT ANGIOGRAPHY CHEST, ABDOMEN AND PELVIS TECHNIQUE: Non-contrast CT of the chest was initially obtained. Multidetector CT imaging through the chest, abdomen and pelvis was performed using the standard protocol during bolus administration of intravenous contrast. Multiplanar reconstructed images and MIPs were obtained and reviewed to evaluate the vascular anatomy. RADIATION DOSE REDUCTION: This exam was performed according to the departmental dose-optimization program which includes automated exposure control, adjustment of the mA and/or kV according to patient size and/or use of iterative reconstruction technique. CONTRAST:  OMNIPAQUE IOHEXOL  350 MG/ML SOLN COMPARISON:  04/07/2023 FINDINGS: CTA CHEST FINDINGS Cardiovascular: Heart is normal size. Aorta is normal caliber. No dissection No filling defects in the pulmonary arteries to suggest pulmonary emboli. Mediastinum/Nodes: No mediastinal, hilar, or axillary adenopathy. Trachea and esophagus are unremarkable. Thyroid unremarkable. Lungs/Pleura: Biapical scarring. Trace left pleural effusion. No confluent opacities or pneumothorax. Musculoskeletal: Chest wall soft tissues are unremarkable. No acute bony abnormality. Review of the MIP images confirms the above findings. CTA ABDOMEN AND PELVIS FINDINGS VASCULAR Aorta: Normal caliber aorta without aneurysm, dissection, vasculitis or significant stenosis. Celiac: Patent. There is a rounded blush of contrast noted in the splenic hilum measuring 1.5 cm concerning for a splenic artery aneurysm or  pseudoaneurysm. This is concerning for the source of the perisplenic hematoma. SMA: Patent without evidence of aneurysm, dissection, vasculitis or significant stenosis. Renals: Both renal arteries are patent without evidence of aneurysm, dissection, vasculitis, fibromuscular dysplasia or significant stenosis. IMA: Patent without evidence of aneurysm, dissection, vasculitis or significant stenosis. Inflow: Patent without evidence of aneurysm, dissection, vasculitis or significant stenosis. Veins: No obvious venous abnormality within the limitations of this arterial phase study. Review of the MIP images confirms the above findings. NON-VASCULAR Hepatobiliary: No focal hepatic abnormality. Gallbladder unremarkable. Pancreas: Calcifications throughout the pancreas compatible with chronic pancreatitis. No evidence of acute pancreatitis or ductal dilatation. Spleen: Large perisplenic hematoma again noted, measuring approximately 13.5 x 6.2 cm. With measured in the same planes and at the same level on prior study, this measured approximately 13 x 4.4 cm. This is felt to be slightly larger than prior study. Adrenals/Urinary Tract: No adrenal abnormality. No focal renal abnormality. No stones or hydronephrosis. Urinary bladder is unremarkable. Stomach/Bowel: Normal appendix. Stomach, large and small bowel grossly unremarkable. Lymphatic: No adenopathy Reproductive: No visible focal abnormality. Other: No free fluid or free air. Musculoskeletal: No acute bony abnormality. Review of the MIP images confirms the above findings. IMPRESSION: 1.5 cm rounded enhancing hypervascular area adjacent to the splenic artery in the region of the splenic hilum adjacent to the pancreatic tail. 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 Large perisplenic hematoma, 13.5 x 6.2 cm, slightly increased in size since prior study. These results were called by telephone at the time of interpretation on 04/20/2023 at 4:33 pm to provider  [facility] , who verbally acknowledged these results. Electronically Signed   By: Charlett Nose M.D.   On: 04/20/2023 16:34    EKG: I independently viewed the EKG done and my findings are as followed: Normal sinus rhythm rate of 72.  Nonspecific ST-T changes.  QTc 496.  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   General Surgery and IR are following.   Possible splenectomy on 04/21/2023.   N.p.o. after midnight.   As needed analgesics and IV antiemetics as needed. Serial H&H every 6 hours Lactic acid level, INR Maintain MAP greater than 65  Mild hypovolemic hyponatremia Serum sodium 132 Gentle IV hydration NS at 50 cc/h x 1 day. Repeat chemistry panel in the morning  Type 2 diabetes with hyperglycemia Hemoglobin A1c 6.7 on 01/23/2021 Obtain hemoglobin A1c Start insulin sliding scale every 4 hours while NPO  Chronic anxiety/depression Resume home regimen.  Essential hypertension BPs are soft Hold off on oral antihypertensives Maintain MAP greater than 65  Chronic pancreatitis Lipase 104 Continue to  monitor for symptomatology Continue to avoid alcohol use  History of alcohol abuse No evidence of alcohol withdrawal at the time of this visit.  QTc prolongation Avoid QTc prolonging agents Optimize magnesium and potassium levels Monitor on telemetry.   Critical care: 75 minutes.   DVT prophylaxis: SCDs.  Pharmacological DVT prophylaxis is contraindicated due to perisplenic hematoma.  Code Status: Full code  Family Communication: None at bedside.  Disposition Plan: Admitted to progressive care unit  Consults called: General Surgery and interventional radiology.  Admission status: Inpatient status.   Status is: Inpatient The  patient requires at least 2 midnights for further evaluation and treatment of present condition.   Darlin Drop MD [facility] Pager [phone]  If 7PM-7AM, please contact night-coverage [email or web address] Password [email or web address]  04/21/2023, 1:25 AM

## 2023-04-21 NOTE — Anesthesia Preprocedure Evaluation (Addendum)
Anesthesia Evaluation  Patient identified by MRN, date of birth, ID band Patient awake    Reviewed: Allergy & Precautions, NPO status , Patient's Chart, lab work & pertinent test results  History of Anesthesia Complications Negative for: history of anesthetic complications  Airway Mallampati: III  TM Distance: >3 FB Neck ROM: Full    Dental  (+) Dental Advisory Given Edentulous on top with stubs for implants. Denies loose teeth.:   Pulmonary neg shortness of breath, neg sleep apnea, neg COPD, neg recent URI, Current Smoker (smokes 1/2 - 1 ppd) and Patient abstained from smoking.   Pulmonary exam normal breath sounds clear to auscultation       Cardiovascular hypertension, Pt. on medications (-) angina (-) Past MI, (-) Cardiac Stents and (-) CABG + dysrhythmias (RBBB, prolonged QT resolved after electrolyte correction)  Rhythm:Regular Rate:Normal  HLD  TTE 01/24/2021: IMPRESSIONS     1. Left ventricular ejection fraction, by estimation, is 60 to 65%. The  left ventricle has normal function. The left ventricle has no regional  wall motion abnormalities. Left ventricular diastolic parameters are  consistent with Grade I diastolic  dysfunction (impaired relaxation).   2. Right ventricular systolic function is normal. The right ventricular  size is normal. Tricuspid regurgitation signal is inadequate for assessing  PA pressure.   3. The mitral valve is normal in structure. No evidence of mitral valve  regurgitation. No evidence of mitral stenosis.   4. The aortic valve is tricuspid. Aortic valve regurgitation is not  visualized. No aortic stenosis is present.   5. The inferior vena cava is normal in size with greater than 50%  respiratory variability, suggesting right atrial pressure of 3 mmHg.     Neuro/Psych neg Seizures negative neurological ROS     GI/Hepatic Neg liver ROS,neg GERD  ,,H/o pancreatitis   Endo/Other   diabetes, Type 2, Oral Hypoglycemic Agents    Renal/GU negative Renal ROS     Musculoskeletal   Abdominal   Peds  Hematology  (+) Blood dyscrasia, anemia Lab Results      Component                Value               Date                      WBC                      13.4 (H)            04/21/2023                HGB                      8.9 (L)             04/21/2023                HCT                      27.5 (L)            04/21/2023                MCV                      92.9  04/21/2023                PLT                      403 (H)             04/21/2023              Anesthesia Other Findings 62 y.o. male with medical history significant for hypertension, hyperlipidemia, alcohol and tobacco use, history of chronic pancreatitis, pancreatic pseudocyst, recent admission (9/25-9/27/24) for contained spontaneous splenic rupture with hematoma, who initially presented to [facility] ED with complaints of left upper abdomen and left flank pain.  His symptoms have been progressively worsening for the past 3 to 4 days.  Denies any subjective fevers or chills.  Further workup showed continued extravasation of blood into the spleen possibly from a neighboring aneurysm.  General surgery was consulted and he was admitted to the hospital.  Reproductive/Obstetrics                              Anesthesia Physical Anesthesia Plan  ASA: 3  Anesthesia Plan: General   Post-op Pain Management:    Induction: Intravenous and Rapid sequence  PONV Risk Score and Plan: 1 and Ondansetron, Dexamethasone and Treatment may vary due to age or medical condition  Airway Management Planned: Oral ETT  Additional Equipment:   Intra-op Plan:   Post-operative Plan: Extubation in OR  Informed Consent: I have reviewed the patients History and Physical, chart, labs and discussed the procedure including the risks, benefits and alternatives for the proposed anesthesia with  the patient or authorized representative who has indicated his/her understanding and acceptance.     Dental advisory given  Plan Discussed with: Anesthesiologist and CRNA  Anesthesia Plan Comments: (Risks of general anesthesia discussed including, but not limited to, sore throat, hoarse voice, chipped/damaged teeth, injury to vocal cords, nausea and vomiting, allergic reactions, lung infection, heart attack, stroke, and death. All questions answered. )         Anesthesia Quick Evaluation

## 2023-04-21 NOTE — Progress Notes (Signed)
Patient instructed to remove all jewelry, dentures or hearing aids if present.  Patient stated he did not have dentures but had "implants".

## 2023-04-21 NOTE — Transfer of Care (Signed)
Immediate Anesthesia Transfer of Care Note  Patient: Samuel Abbott  Procedure(s) Performed: OPEN SPLENECTOMY  Patient Location: PACU  Anesthesia Type:General  Level of Consciousness: sedated and drowsy  Airway & Oxygen Therapy: Patient Spontanous Breathing and Patient connected to face mask oxygen  Post-op Assessment: Report given to RN and Post -op Vital signs reviewed and stable  Post vital signs: Reviewed and stable  Last Vitals:  Vitals Value Taken Time  BP 125/66 04/21/23 1430  Temp 97.5   Pulse 77 04/21/23 1433  Resp 10 04/21/23 1433  SpO2 100 % 04/21/23 1433  Vitals shown include unfiled device data.  Last Pain:  Vitals:   04/21/23 1035  TempSrc:   PainSc: 6       Patients Stated Pain Goal: 0 (04/21/23 1035)  Complications: No notable events documented.

## 2023-04-21 NOTE — Op Note (Signed)
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  Indications for procedure: The patient is a 62 year old male who was has a chronic splenic rupture.  He has a history of alcohol abuse and chronic pancreatitis.  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  He was managed initially nonoperative since he was stable last month.  He was readmitted due to increasing pain and expansion of the hematoma.  Options of embolization were reviewed but due to the fact that short gastrics are still present there is concern he would backbleed into this hematoma making embolization less successful given its chronic state.  We reviewed options of surgical exploration versus embolization.  Options of preoperative embolization were reviewed but he was having significant pain and discomfort which was new for him.  Concerns that this would expand and rupture at some point.  After lengthy discussion reviewing all of his options, he wished to proceed with open splenectomy after reviewing all of his options.  Risk of bleeding, infection, death, DVT, abscess, injury to stomach, injury diaphragm, injury to colon, injury to other.  Retroperitoneal structures, potential pancreatic injury and/or necrosis requiring drainage for more surgery given the chronicity and inflammation for this.  Description of procedure: The patient was met in the holding area and options were reviewed a second time.  He was eager to proceed.  He was taken back to the operative room.  He is placed supine upon the operating table.  After induction of  general anesthesia, a nasogastric tube was placed.  His abdomen is then prepped and draped in sterile fashion and he received preoperative antibiotics.  Timeout performed.  A left subcostal incision was made.  Dissection was carried through the subcutaneous fat.  Exparel 20 cc diluted with 20 cc of saline were injected into the rectus muscles and the abdominal wall musculature on the left.  The abdominal wall musculature was then opened in layers through the rectus abdominis, external bleak and internal bleak.  Once this was opened a retractor was placed.  He had a large chronic hematoma of the left upper quadrant.  There were dense adhesions since has been present for almost a month.  There is significant inflammatory changes of the transverse colon and greater curvature of the stomach.  This was densely adherent to the undersurface of the diaphragm into the retroperitoneal space.  We were able to begin to slowly mobilize the lateral part of the spleen.  There is a large perisplenic hematoma with old blood that had about a liter of old blood in it.  There is significant using but we carefully mobilized the spleen from the retroperitoneum.  We were able to then dissect the splenic flexure of the colon away from the inferior pole of the spleen using cautery and the LigaSure taking care to not injure the transverse colon.  We then were able to take some of the short gastrics down but there was significant inflammatory change at the hilum of the spleen.  We used a GIA 45 stapler with a vascular load.  We dissected we could of the hilum but this was significantly disrupted from the  rupture and perisplenic hematoma which was now chronic.  We were able to take this in a piecemeal fashion to amputate the spleen.  The pancreas was significantly distorted due to the hematoma and was more superior and anterior than usual.  We identified this as well and dissected away the best we could.  We were then able to take out what was  left of the spleen with remaining firing of the GIA 45 stapler.  This was removed.  The pancreas was examined.  The tail appeared intact but there is significant oozing from this.  There is also significant oozing from what was left of the hilum due to the chronicity and the severe inflammatory changes noted.  This was irrigated.  Pressure was held for at least 10 minutes.  We used a combination of Arista, Surgicel snow and Vistaseal to help control bleeding and the space since there is more oozing than any active arterial bleeding that I could see.  After 20 minutes of holding pressure this stopped with very good hemostasis.  We then observe this for another 5 minutes and did not see any blood welling up from this point.  Through a stab incision just below the left subcostal incision, 19 round drain was placed.  Reexamination of the splenic bed revealed no further oozing or bleeding.  NG tube position to the stomach.  We then closed the abdominal wall layers closing the posterior layer first with #1 PDS single-stranded.  We then closed the anterior fascia of this with #1 PDS.  Skin staples used close skin.  Honeycomb dressing placed.  JP placed to bulb suction.  All counts were found to be correct.  The patient was awoke extubated taken to recovery in satisfactory condition.

## 2023-04-21 NOTE — H&P (View-Only) (Signed)
Samuel Abbott 10-12-1960  [id number].    Requesting MD: Dr. 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  His hemoglobin remained stable throughout the stay and he discharged without intervention.  He reports that since discharge he has continued to have significant LUQ abdominal pain with radiation to his shoulder. 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  He was transferred to [facility] and arrived in stable condition.  Hb 9.8 (was 9.2 on 9/27).  MAPs 70s-80s and HR 70s.    On exam, patient resting comfortably. NAD.  He reports that his last drink was last week.   ROS: Review of Systems  Constitutional: Negative.   HENT: Negative.    Eyes: Negative.   Respiratory: Negative.    Cardiovascular: Negative.   Gastrointestinal:  Positive for abdominal pain.  Genitourinary: Negative.   Musculoskeletal: Negative.   Skin: Negative.   Neurological: Negative.   Endo/Heme/Allergies: Negative.   Psychiatric/Behavioral: Negative.      Family History  Problem Relation Age of Onset   CVA Father     Past Medical History:  Diagnosis Date   Acute pancreatitis    Alcohol abuse    Eczema    High cholesterol    Hypertension    Tobacco abuse     Past Surgical History:  Procedure Laterality Date   WRIST SURGERY      Social History:  reports that he has been smoking cigarettes. He has a 60 pack-year smoking history. He uses smokeless tobacco. He reports that he does not currently use alcohol after a past usage of about 28.0 standard drinks of alcohol per week. He reports that he does not currently use drugs.  Allergies:  Allergies  Allergen Reactions   Amoxicillin  Anaphylaxis   Penicillins Anaphylaxis    Medications Prior to Admission  Medication Sig Dispense Refill   atorvastatin (LIPITOR) 40 MG tablet Take 1 tablet (40 mg total) by mouth daily. 30 tablet 1   DULoxetine (CYMBALTA) 20 MG capsule Take 20 mg by mouth 2 (two) times daily.     hydrOXYzine (VISTARIL) 25 MG capsule Take 25 mg by mouth at bedtime.     losartan (COZAAR) 25 MG tablet Take 25 mg by mouth daily.     metFORMIN (GLUCOPHAGE) 500 MG tablet Take 1 tablet (500 mg total) by mouth daily with breakfast. 30 tablet 0   Multiple Vitamin (MULTIVITAMIN) capsule Take 1 capsule by mouth daily.     nicotine (NICODERM CQ - DOSED IN MG/24 HOURS) 21 mg/24hr patch Place 1 patch (21 mg total) onto the skin daily. (Patient not taking: Reported on 04/07/2023) 28 patch 0   ondansetron (ZOFRAN-ODT) 4 MG disintegrating tablet Take 1 tablet (4 mg total) by mouth every 8 (eight) hours as needed for nausea or vomiting. 20 tablet 0   oxyCODONE-acetaminophen (PERCOCET/ROXICET) 5-325 MG tablet Take 1-2 tablets by mouth every 6 (six) hours as needed for severe pain. 25 tablet 0   polyethylene glycol (MIRALAX / GLYCOLAX) 17 g packet Take 17 g by mouth daily. 14 each 0   senna-docusate (SENOKOT-S) 8.6-50 MG tablet Take 2 tablets by mouth at bedtime. 30 tablet 0   tiZANidine (ZANAFLEX) 4 MG tablet  Take 4 mg by mouth every 8 (eight) hours as needed for muscle spasms.      Physical Exam: Blood pressure 117/70, pulse 82, temperature 98.6 F (37 C), temperature source Axillary, resp. rate 14, height 6' (1.829 m), weight 97.7 kg, SpO2 98%. Gen: adult male, NAD Resp: equal chest rise CV: RRR, HR 70s Abd: soft, non-distended, TTP in the LUQ w/ guarding, no rebound, no peritoneal signs Neuro: moving all extremities  Results for orders placed or performed during the hospital encounter of 04/20/23 (from the past 48 hour(s))  Comprehensive metabolic panel     Status: Abnormal   Collection Time: 04/20/23 12:45 PM  Result  Value Ref Range   Sodium 132 (L) 135 - 145 mmol/L   Potassium 3.5 3.5 - 5.1 mmol/L   Chloride 97 (L) 98 - 111 mmol/L   CO2 23 22 - 32 mmol/L   Glucose, Bld 249 (H) 70 - 99 mg/dL    Comment: Glucose reference range applies only to samples taken after fasting for at least 8 hours.   BUN 14 6 - 20 mg/dL   Creatinine, Ser 1.61 0.61 - 1.24 mg/dL   Calcium 8.8 (L) 8.9 - 10.3 mg/dL   Total Protein 6.9 6.5 - 8.1 g/dL   Albumin 3.2 (L) 3.5 - 5.0 g/dL   AST 36 15 - 41 U/L   ALT 99 (H) 0 - 44 U/L   Alkaline Phosphatase 117 38 - 126 U/L   Total Bilirubin 0.5 0.3 - 1.2 mg/dL   GFR, Estimated >09 >60 mL/min    Comment: (NOTE) Calculated using the CKD-EPI Creatinine Equation (2021)    Anion gap 12 5 - 15    Comment: Performed at [facility], [address] [place], [place] [address]  CBC     Status: Abnormal   Collection Time: 04/20/23 12:45 PM  Result Value Ref Range   WBC 15.1 (H) 4.0 - 10.5 K/uL   RBC 3.42 (L) 4.22 - 5.81 MIL/uL   Hemoglobin 10.2 (L) 13.0 - 17.0 g/dL   HCT 81.1 (L) 91.4 - 78.2 %   MCV 90.6 80.0 - 100.0 fL   MCH 29.8 26.0 - 34.0 pg   MCHC 32.9 30.0 - 36.0 g/dL   RDW 95.6 21.3 - 08.6 %   Platelets 463 (H) 150 - 400 K/uL   nRBC 0.0 0.0 - 0.2 %    Comment: Performed at [facility], [address] [place], [place] [address]  Lipase, blood     Status: Abnormal   Collection Time: 04/20/23  2:56 PM  Result Value Ref Range   Lipase 104 (H) 11 - 51 U/L    Comment: Performed at [facility], [address] [place], [place] [address]  Troponin I (High Sensitivity)     Status: None   Collection Time: 04/20/23  2:56 PM  Result Value Ref Range   Troponin I (High Sensitivity) 3 <18 ng/L    Comment: (NOTE) Elevated high sensitivity troponin I (hsTnI) values and significant  changes across serial measurements may suggest ACS but many other  chronic and acute conditions are known to elevate hsTnI results.  Refer to the "Links" section for chest  pain algorithms and additional  guidance. Performed at [facility], [address] [place], [place] [address]   Hemoglobin and hematocrit, blood     Status: Abnormal   Collection Time: 04/21/23  1:10 AM  Result Value Ref Range  Hemoglobin 9.8 (L) 13.0 - 17.0 g/dL   HCT 32.4 (L) 40.1 - 02.7 %    Comment: Performed at [facility], [address] [place], [place] [address]  Type and screen [facility]     Status: None   Collection Time: 04/21/23  1:10 AM  Result Value Ref Range   ABO/RH(D) O POS    Antibody Screen NEG    Sample Expiration      04/24/2023,2359 Performed at [facility], [address] [place], [place] [address]   Protime-INR     Status: None   Collection Time: 04/21/23  1:22 AM  Result Value Ref Range   Prothrombin Time 13.9 11.4 - 15.2 seconds   INR 1.1 0.8 - 1.2    Comment: (NOTE) INR goal varies based on device and disease states. Performed at [facility], [address], [place], [place] [address]    CT Angio Chest/Abd/Pel for Dissection W and/or Wo Contrast  Result Date: 04/20/2023 CLINICAL DATA:  Left flank pain radiating to left shoulder. History of perisplenic hematoma. EXAM: CT ANGIOGRAPHY CHEST, ABDOMEN AND PELVIS TECHNIQUE: Non-contrast CT of the chest was initially obtained. Multidetector CT imaging through the chest, abdomen and pelvis was performed using the standard protocol during bolus administration of intravenous contrast. Multiplanar reconstructed images and MIPs were obtained and reviewed to evaluate the vascular anatomy. RADIATION DOSE REDUCTION: This exam was performed according to the departmental dose-optimization program which includes automated exposure control, adjustment of the mA and/or kV according to patient size and/or use of iterative reconstruction technique. CONTRAST:  OMNIPAQUE IOHEXOL 350 MG/ML SOLN COMPARISON:  04/07/2023 FINDINGS: CTA CHEST FINDINGS Cardiovascular: Heart is  normal size. Aorta is normal caliber. No dissection No filling defects in the pulmonary arteries to suggest pulmonary emboli. Mediastinum/Nodes: No mediastinal, hilar, or axillary adenopathy. Trachea and esophagus are unremarkable. Thyroid unremarkable. Lungs/Pleura: Biapical scarring. Trace left pleural effusion. No confluent opacities or pneumothorax. Musculoskeletal: Chest wall soft tissues are unremarkable. No acute bony abnormality. Review of the MIP images confirms the above findings. CTA ABDOMEN AND PELVIS FINDINGS VASCULAR Aorta: Normal caliber aorta without aneurysm, dissection, vasculitis or significant stenosis. Celiac: Patent. 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 This is concerning for the source of the perisplenic hematoma. SMA: Patent without evidence of aneurysm, dissection, vasculitis or significant stenosis. Renals: Both renal arteries are patent without evidence of aneurysm, dissection, vasculitis, fibromuscular dysplasia or significant stenosis. IMA: Patent without evidence of aneurysm, dissection, vasculitis or significant stenosis. Inflow: Patent without evidence of aneurysm, dissection, vasculitis or significant stenosis. Veins: No obvious venous abnormality within the limitations of this arterial phase study. Review of the MIP images confirms the above findings. NON-VASCULAR Hepatobiliary: No focal hepatic abnormality. Gallbladder unremarkable. Pancreas: Calcifications throughout the pancreas compatible with chronic pancreatitis. No evidence of acute pancreatitis or ductal dilatation. Spleen: Large perisplenic hematoma again noted, measuring approximately 13.5 x 6.2 cm. With measured in the same planes and at the same level on prior study, this measured approximately 13 x 4.4 cm. This is felt to be slightly larger than prior study. Adrenals/Urinary Tract: No adrenal abnormality. No focal  renal abnormality. No stones or hydronephrosis. Urinary bladder is unremarkable. Stomach/Bowel: Normal appendix. Stomach, large and small bowel grossly unremarkable. Lymphatic: No adenopathy Reproductive: No visible focal abnormality. Other: No free fluid or free air. Musculoskeletal: No acute bony abnormality. Review  of the MIP images confirms the above findings. IMPRESSION: 1.5 cm rounded enhancing hypervascular area adjacent to the splenic artery in the region of the splenic hilum adjacent to the pancreatic tail. 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 Large perisplenic hematoma, 13.5 x 6.2 cm, slightly increased in size since prior study. These results were called by telephone at the time of interpretation on 04/20/2023 at 4:33 pm to provider [facility] , who verbally acknowledged these results. Electronically Signed   By: Charlett Nose M.D.   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  I discussed his case with Dr. Bryn Gulling from IR.  He might be able to provide a temporizing measure through embolization but would be unlikely to offer definitive control given the concern for back bleeding. There is no indication for emergent splenectomy at this time.  His collection is relatively stable from his prior study on 9/25 and he is HDS with a stable Hb.   - Recommend monitoring him in a controlled setting with heart monitoring and frequent blood pressure checks - Maintain reliable IV access including at least 2 large bore IVs - NPO with MIVF - Q6 H&H - I will discuss definitive management with the rounding team but will remain available to assess for more urgent intervention should there be any  clinical changes tonight.  Please page me with any questions or concerns.  Secure chat is not a reliable method for reaching me to discuss urgent patient issues.  I reviewed last 24 h vitals and pain scores, last 24 h labs and trends, and last 24 h imaging results.  Tacy [facility] 04/21/2023, 2:29 AM Please see Amion for pager number during day hours 7:00am-4:30pm or 7:00am -11:30am on weekends

## 2023-04-21 NOTE — Anesthesia Postprocedure Evaluation (Signed)
Anesthesia Post Note  Patient: Samuel Abbott  Procedure(s) Performed: OPEN SPLENECTOMY     Patient location during evaluation: PACU Anesthesia Type: General Level of consciousness: awake Pain management: pain level controlled Vital Signs Assessment: post-procedure vital signs reviewed and stable Respiratory status: spontaneous breathing, nonlabored ventilation and respiratory function stable Cardiovascular status: blood pressure returned to baseline and stable Postop Assessment: no apparent nausea or vomiting Anesthetic complications: no  No notable events documented.  Last Vitals:  Vitals:   04/21/23 1501 04/21/23 1515  BP: (!) 107/55 117/62  Pulse: 64 66  Resp: 11 18  Temp:    SpO2: 97% 96%    Last Pain:  Vitals:   04/21/23 1530  TempSrc:   PainSc: 7                  Linton Rump

## 2023-04-21 NOTE — Interval H&P Note (Signed)
History and Physical Interval Note:  04/21/2023 10:51 AM  Samuel Abbott  has presented today for surgery, with the diagnosis of ROPTURE SPLEEN.  The various methods of treatment have been discussed with the patient and family. After consideration of risks, benefits and other options for treatment, the patient has consented to  Procedure(s): OPEN SPLENECTOMY (N/A) as a surgical intervention.  The patient's history has been reviewed, patient examined, no change in status, stable for surgery.  I have reviewed the patient's chart and labs.  Questions were answered to the patient's satisfaction.    Patient seen, examined agree.  Chart reviewed and imaging reviewed.  Chronic rupture of spleen.  He has had a pseudoaneurysm well.  I think the backbleeding from the short gastrics will make this continue to grow slowly.  He is having pain, nausea or vomiting is quite symptomatic from it.  I do not feel embolization of this point will be useful due to back bleeding and recommend open splenectomy.  Risks and benefits of this reviewed as well as the need for postsplenectomy vaccinations.  We discussed the asplenic state going forward and the potential risk of life-threatening infections and a small percentage of patients.  Risk of bleeding, infection, damage to the pancreas, damage to the diaphragm, damage to other neighboring structures, wound complications, hernia, injury to the stomach, need further operative interventions reviewed.  Other complications cardiovascular vent, death, DVT, and the need further treatments and/or procedures reviewed as well.  The need of blood products if the situation arises also reviewed. Samuel Abbott

## 2023-04-21 NOTE — Plan of Care (Signed)
  Problem: Activity: Goal: Ability to return to normal activity level will improve to the fullest extent possible by discharge Outcome: Progressing   Problem: Education: Goal: Knowledge of medication regimen will be met for pain relief regimen by discharge Outcome: Progressing Goal: Understanding of ways to prevent infection will improve by discharge Outcome: Progressing   Problem: Coping: Goal: Ability to verbalize feelings will improve by discharge Outcome: Progressing   Problem: Pain Management: Goal: Satisfaction with pain management regimen will be met by discharge Outcome: Progressing

## 2023-04-22 ENCOUNTER — Inpatient Hospital Stay (HOSPITAL_COMMUNITY): Payer: 59

## 2023-04-22 ENCOUNTER — Encounter (HOSPITAL_COMMUNITY): Payer: Self-pay | Admitting: Surgery

## 2023-04-22 DIAGNOSIS — D735 Infarction of spleen: Secondary | ICD-10-CM | POA: Diagnosis not present

## 2023-04-22 LAB — BASIC METABOLIC PANEL
Anion gap: 10 (ref 5–15)
BUN: 9 mg/dL (ref 6–20)
CO2: 26 mmol/L (ref 22–32)
Calcium: 8.9 mg/dL (ref 8.9–10.3)
Chloride: 99 mmol/L (ref 98–111)
Creatinine, Ser: 0.68 mg/dL (ref 0.61–1.24)
GFR, Estimated: >60 mL/min (ref >60)
Glucose, Bld: 129 mg/dL — ABNORMAL HIGH (ref 70–99)
Potassium: 4.5 mmol/L (ref 3.5–5.1)
Sodium: 135 mmol/L (ref 135–145)

## 2023-04-22 LAB — CBC WITH DIFFERENTIAL/PLATELET
Abs Immature Granulocytes: 0.2 10*3/uL — ABNORMAL HIGH (ref 0.00–0.07)
Basophils Absolute: 0 10*3/uL (ref 0.0–0.1)
Basophils Relative: 0 %
Eosinophils Absolute: 0 10*3/uL (ref 0.0–0.5)
Eosinophils Relative: 0 %
HCT: 26.1 % — ABNORMAL LOW (ref 39.0–52.0)
Hemoglobin: 8.7 g/dL — ABNORMAL LOW (ref 13.0–17.0)
Immature Granulocytes: 1 %
Lymphocytes Relative: 9 %
Lymphs Abs: 1.8 10*3/uL (ref 0.7–4.0)
MCH: 29.3 pg (ref 26.0–34.0)
MCHC: 33.3 g/dL (ref 30.0–36.0)
MCV: 87.9 fL (ref 80.0–100.0)
Monocytes Absolute: 1 10*3/uL (ref 0.1–1.0)
Monocytes Relative: 5 %
Neutro Abs: 16.1 10*3/uL — ABNORMAL HIGH (ref 1.7–7.7)
Neutrophils Relative %: 85 %
Platelets: 348 10*3/uL (ref 150–400)
RBC: 2.97 MIL/uL — ABNORMAL LOW (ref 4.22–5.81)
RDW: 14 % (ref 11.5–15.5)
WBC: 19 10*3/uL — ABNORMAL HIGH (ref 4.0–10.5)
nRBC: 0 % (ref 0.0–0.2)

## 2023-04-22 LAB — GLUCOSE, CAPILLARY
Glucose-Capillary: 132 mg/dL — ABNORMAL HIGH (ref 70–99)
Glucose-Capillary: 136 mg/dL — ABNORMAL HIGH (ref 70–99)
Glucose-Capillary: 139 mg/dL — ABNORMAL HIGH (ref 70–99)
Glucose-Capillary: 150 mg/dL — ABNORMAL HIGH (ref 70–99)
Glucose-Capillary: 176 mg/dL — ABNORMAL HIGH (ref 70–99)
Glucose-Capillary: 85 mg/dL (ref 70–99)

## 2023-04-22 LAB — PROCALCITONIN: Procalcitonin: <0.1 ng/mL

## 2023-04-22 LAB — BRAIN NATRIURETIC PEPTIDE: B Natriuretic Peptide: 38.2 pg/mL (ref 0.0–100.0)

## 2023-04-22 LAB — C-REACTIVE PROTEIN: CRP: 12.1 mg/dL — ABNORMAL HIGH (ref <1.0)

## 2023-04-22 LAB — MAGNESIUM: Magnesium: 1.8 mg/dL (ref 1.7–2.4)

## 2023-04-22 MED ORDER — CHLORHEXIDINE GLUCONATE CLOTH 2 % EX PADS
6.0000 | MEDICATED_PAD | Freq: Every day | CUTANEOUS | Status: DC
  Administered 2023-04-22 – 2023-04-23 (×2): 6 via TOPICAL

## 2023-04-22 MED ORDER — HYDROMORPHONE HCL 1 MG/ML IJ SOLN
0.5000 mg | INTRAMUSCULAR | Status: DC | PRN
  Administered 2023-04-22 – 2023-04-23 (×6): 0.5 mg via INTRAVENOUS
  Filled 2023-04-22 (×6): qty 0.5

## 2023-04-22 MED ORDER — HYDROCODONE-ACETAMINOPHEN 7.5-325 MG PO TABS
1.0000 | ORAL_TABLET | Freq: Four times a day (QID) | ORAL | Status: DC | PRN
  Administered 2023-04-22 – 2023-04-23 (×3): 1 via ORAL
  Filled 2023-04-22 (×4): qty 1

## 2023-04-22 MED ORDER — PNEUMOCOCCAL 20-VAL CONJ VACC 0.5 ML IM SUSY
0.5000 mL | PREFILLED_SYRINGE | INTRAMUSCULAR | Status: AC | PRN
  Administered 2023-04-23: 0.5 mL via INTRAMUSCULAR

## 2023-04-22 MED ORDER — MENINGOCOCCAL VAC B (OMV) IM SUSY: 0.5000 mL | PREFILLED_SYRINGE | INTRAMUSCULAR | Status: DC | PRN

## 2023-04-22 MED ORDER — CIPROFLOXACIN IN D5W 400 MG/200ML IV SOLN
400.0000 mg | Freq: Two times a day (BID) | INTRAVENOUS | Status: AC
  Administered 2023-04-22: 400 mg via INTRAVENOUS
  Filled 2023-04-22: qty 200

## 2023-04-22 MED ORDER — LACTATED RINGERS IV BOLUS
500.0000 mL | Freq: Once | INTRAVENOUS | Status: AC
  Administered 2023-04-22: 500 mL via INTRAVENOUS

## 2023-04-22 MED ORDER — MIDODRINE HCL 5 MG PO TABS
5.0000 mg | ORAL_TABLET | Freq: Three times a day (TID) | ORAL | Status: DC
  Administered 2023-04-22 – 2023-04-23 (×4): 5 mg via ORAL
  Filled 2023-04-22 (×5): qty 1

## 2023-04-22 MED ORDER — HAEMOPHILUS B POLYSAC CONJ VAC IM SOLR
0.5000 mL | INTRAMUSCULAR | Status: AC | PRN
  Administered 2023-04-23: 0.5 mL via INTRAMUSCULAR

## 2023-04-22 MED ORDER — METRONIDAZOLE 500 MG/100ML IV SOLN
500.0000 mg | Freq: Two times a day (BID) | INTRAVENOUS | Status: DC
  Administered 2023-04-22 – 2023-04-23 (×3): 500 mg via INTRAVENOUS
  Filled 2023-04-22 (×3): qty 100

## 2023-04-22 MED ORDER — METHOCARBAMOL 500 MG PO TABS
500.0000 mg | ORAL_TABLET | Freq: Three times a day (TID) | ORAL | Status: DC
  Administered 2023-04-22 – 2023-04-23 (×4): 500 mg via ORAL
  Filled 2023-04-22 (×4): qty 1

## 2023-04-22 MED ORDER — MENINGOCOCCAL A C Y&W-135 OLIG IM SOLN: 0.5000 mL | INTRAMUSCULAR | Status: DC | PRN

## 2023-04-22 NOTE — Progress Notes (Signed)
Mobility Specialist Progress Note;   04/22/23 1555  Mobility  Activity Ambulated independently in hallway  Level of Assistance Modified independent, requires aide device or extra time  Assistive Device Other (Comment) (IV pole)  Distance Ambulated (ft) 150 ft  Activity Response Tolerated well  Mobility Referral Yes  $Mobility charge 1 Mobility  Mobility Specialist Start Time (ACUTE ONLY) 1555  Mobility Specialist Stop Time (ACUTE ONLY) 1605  Mobility Specialist Time Calculation (min) (ACUTE ONLY) 10 min   Pt agreeable to mobility. Required no physical assistance during ambulation. Ambulated with IV pole vs RW. C/o surgical site pain post ambulation. Pt back in chair with all needs met.   Caesar Bookman Mobility Specialist Please contact via SecureChat or Rehab Office [phone]

## 2023-04-22 NOTE — Evaluation (Signed)
Physical Therapy Evaluation Patient Details Name: Samuel Abbott MRN: [id number] DOB: 07-30-1960 Today's Date: 04/22/2023  History of Present Illness  Pt is 62 yo presenting to [facility] ED with reports of L upper abdomen and L flank pain. Symptoms have progressively worsened for the past 3-4 days. Found to have continued extravasation of blood into the spleen. Recent hospital for 9/25-9/27/24 for contained spontaneous splenic rupture with hematoma. 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 PMH: HTN, hyperlipidemia, chronic pancreatitis, pancreatic pseudocyst.  Clinical Impression  Pt is presenting below baseline level of functioning. Prior to hospitalization pt was ind with all activities. Currently pt is Mod I with sit to stand and supervision for gait due to fatigue and pain. Currently no recommended skilled physical therapy services on discharge from acute care hospital setting. Will continue to follow in the acute car setting in order to ensure that pt returns home safely with decreased risk for falls, injury and re-hospitalization.       If plan is discharge home, recommend the following: Assist for transportation;Assistance with cooking/housework;Help with stairs or ramp for entrance     Equipment Recommendations Rolling walker (2 wheels)     Functional Status Assessment Patient has had a recent decline in their functional status and demonstrates the ability to make significant improvements in function in a reasonable and predictable amount of time.     Precautions / Restrictions Precautions Precautions: Fall Restrictions Weight Bearing Restrictions: No      Mobility  Bed Mobility     General bed mobility comments: pt received in recliner and returned to recliner on request at end of session. Discussed log roll technique and using pillow to brace.    Transfers Overall transfer level: Modified independent Equipment used: Rolling walker (2 wheels)         General transfer comment: Pt performed  with and without pillow to brace. Educated bracing will help with pain during transitions. Pt stated he did have some relief when using pillow    Ambulation/Gait Ambulation/Gait assistance: Supervision Gait Distance (Feet): 200 Feet Assistive device: Rolling walker (2 wheels) Gait Pattern/deviations: Step-through pattern, Decreased stride length Gait velocity: decreased Gait velocity interpretation: 1.31 - 2.62 ft/sec, indicative of limited community ambulator   General Gait Details: Lower foot clearance and decrased DF with fatigue at ~100 ft       Balance Overall balance assessment: Mild deficits observed, not formally tested         Pertinent Vitals/Pain Pain Assessment Pain Assessment: 0-10 Pain Score: 9  Pain Location: abdomen Pain Descriptors / Indicators: Sharp, Aching Pain Intervention(s): Monitored during session, Limited activity within patient's tolerance, Premedicated before session    Home Living Family/patient expects to be discharged to:: Private residence Living Arrangements: Alone Available Help at Discharge: Available 24 hours/day (going to stay with mother) Type of Home: Other(Comment) (Condo) Home Access: Stairs to enter Entrance Stairs-Rails: Can reach both;Left;Right Entrance Stairs-Number of Steps: 25-30 steps   Home Layout: One level Home Equipment: Shower seat - built in Additional Comments: Pt will be staying with his mother on discharge. She has 2-3 steps to get into her one level apt with regular bathroom with walk in shower    Prior Function Prior Level of Function : Independent/Modified Independent;Working/employed;Driving             Mobility Comments: Works as a Conservator, museum/gallery, very active, Ind prior to illness. ADLs Comments: Ind     Extremity/Trunk Assessment   Upper Extremity Assessment Upper Extremity Assessment: Overall WFL for tasks assessed  Lower Extremity Assessment Lower Extremity Assessment: Overall WFL for tasks  assessed    Cervical / Trunk Assessment Cervical / Trunk Assessment: Normal  Communication   Communication Communication: No apparent difficulties  Cognition Arousal: Alert Behavior During Therapy: WFL for tasks assessed/performed Overall Cognitive Status: Within Functional Limits for tasks assessed       General Comments General comments (skin integrity, edema, etc.): Pt had some pallor, minimal drainage in JP drain        Assessment/Plan    PT Assessment Patient needs continued PT services  PT Problem List Decreased mobility       PT Treatment Interventions DME instruction;Therapeutic exercise;Gait training;Stair training;Functional mobility training;Therapeutic activities;Patient/family education    PT Goals (Current goals can be found in the Care Plan section)  Acute Rehab PT Goals Patient Stated Goal: To return home and return to PLOF PT Goal Formulation: With patient Time For Goal Achievement: 05/06/23 Potential to Achieve Goals: Good    Frequency Min 1X/week        AM-PAC PT "6 Clicks" Mobility  Outcome Measure Help needed turning from your back to your side while in a flat bed without using bedrails?: A Little Help needed moving from lying on your back to sitting on the side of a flat bed without using bedrails?: A Little Help needed moving to and from a bed to a chair (including a wheelchair)?: A Little Help needed standing up from a chair using your arms (e.g., wheelchair or bedside chair)?: None Help needed to walk in hospital room?: A Little Help needed climbing 3-5 steps with a railing? : A Little 6 Click Score: 19    End of Session   Activity Tolerance: Patient tolerated treatment well Patient left: in chair;with call bell/phone within reach Nurse Communication: Mobility status PT Visit Diagnosis: Other abnormalities of gait and mobility (R26.89)    Time: 8413-2440 PT Time Calculation (min) (ACUTE ONLY): 23 min   Charges:   PT  Evaluation $PT Eval Low Complexity: 1 Low PT Treatments $Therapeutic Activity: 8-22 mins PT General Charges $$ ACUTE PT VISIT: 1 Visit       Harrel Carina, DPT, CLT  Acute Rehabilitation Services Office: [phone] (Secure chat preferred)   Claudia Desanctis 04/22/2023, 2:11 PM

## 2023-04-22 NOTE — Progress Notes (Addendum)
PROGRESS NOTE                                                                                                                                                                                                             Patient Demographics:    Samuel Abbott, is a 62 y.o. male, DOB - 06/20/1961, GNF:[id number]  Outpatient Primary MD for the patient is Norm Salt, [place]    LOS - 2  Admit date - 04/20/2023    Chief Complaint  Patient presents with   Flank Pain   Shoulder Pain       Brief Narrative (HPI from H&P)   62 y.o. male with medical history significant for hypertension, hyperlipidemia, alcohol and tobacco use, history of chronic pancreatitis, pancreatic pseudocyst, recent admission (9/25-9/27/24) for contained spontaneous splenic rupture with hematoma, who initially presented to [facility] ED with complaints of left upper abdomen and left flank pain.  His symptoms have been progressively worsening for the past 3 to 4 days.  Denies any subjective fevers or chills.  Further workup showed continued extravasation of blood into the spleen possibly from a neighboring aneurysm.  General surgery was consulted and he was admitted to the hospital.   Subjective:   Patient in bed denies any headache, no chest pain or shortness of breath, postop abdominal pain is present, no nausea, no focal weakness.   Assessment  & Plan :   Patient observed multiple times to be in no discomfort sitting in room in no distress, repeatedly asking for higher dose IV narcotics namely Dilaudid and then says that he would want some real oral pain medicine and not simple acetaminophen and Percocets would be the best and 2 would be better than 1, he was advised that he will receive pain medications what appears to be appropriate at this time.    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  With some blood loss related anemia, and by general surgery underwent open splenectomy on 04/21/2023, postsplenectomy reactionary leukocytosis which is improving, has JP drain, on clear liquid diet, pain control continue to monitor, CBC currently stable.   Mild hypovolemic hyponatremia Serum sodium 132 Gentle IV hydration NS at 50 cc/h x 1 day.   Chronic anxiety/depression Resume home regimen.  Essential hypertension BPs slightly soft, poor oral intake, low-dose midodrine, IV fluids and monitor.   Chronic pancreatitis Lipase 104 Continue to monitor for symptomatology Continue to avoid alcohol use   History of alcohol abuse and smoking No evidence of alcohol withdrawal at the time of this visit.  CIWA protocol.  Drinks 3-4 bottles of beer a day and says he never goes into withdrawal.  Counseled to quit all, NicoDerm patch added.   QTc prolongation Resolved after electrolyte replacement   Type 2 diabetes with hyperglycemia   Start insulin sliding scale every 4 hours while NPO  CBG (last 3)  Recent Labs    04/21/23 2309 04/22/23 0415 04/22/23 0747  GLUCAP 138* 136* 139*   Lab Results  Component Value Date   HGBA1C 7.0 (H) 04/21/2023           Condition - Fair  Family Communication  :  None  Code Status :  Full  Consults  :  CCS  PUD Prophylaxis :    Procedures  :     Open splenectomy 04/21/2023 by Dr. Luisa Hart   CT - 1.5 cm rounded enhancing hypervascular area adjacent to the splenic artery in the region of the splenic hilum adjacent to the pancreatic tail. 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 Large perisplenic hematoma, 13.5 x 6.2 cm, slightly increased in size since prior study      Disposition Plan  :    Status is: Inpatient   DVT Prophylaxis  :    SCDs Start: 04/21/23 0101    Lab Results  Component Value Date   PLT 348 04/22/2023    Diet :  Diet Order              Diet clear liquid Room service appropriate? Yes; Fluid consistency: Thin  Diet effective now                    Inpatient Medications  Scheduled Meds:  chlordiazePOXIDE  10 mg Oral TID   Chlorhexidine Gluconate Cloth  6 each Topical Daily   DULoxetine  20 mg Oral BID   folic acid  1 mg Oral Daily   insulin aspart  0-9 Units Subcutaneous Q4H   methocarbamol  500 mg Oral Q8H   metoCLOPramide (REGLAN) injection  5 mg Intravenous Q8H   midodrine  5 mg Oral TID WC   multivitamin with minerals  1 tablet Oral Daily   nicotine  21 mg Transdermal Daily   thiamine  100 mg Oral Daily   Continuous Infusions:  acetaminophen 1,000 mg (04/22/23 0641)   PRN Meds:.acetaminophen, albuterol, HYDROcodone-acetaminophen, HYDROmorphone (DILAUDID) injection, LORazepam **OR** LORazepam, prochlorperazine  Antibiotics  :    Anti-infectives (From admission, onward)    Start     Dose/Rate Route Frequency Ordered Stop   04/21/23 0945  ciprofloxacin (CIPRO) IVPB 400 mg        400 mg 200 mL/hr over 60 Minutes Intravenous On call to O.R. 04/21/23 0848 04/21/23 1148         Objective:   Vitals:   04/22/23 0227 04/22/23 0400 04/22/23 0542 04/22/23 0746  BP: (!) 109/56 110/62 (!) 120/58 113/63  Pulse: 62 62 71 73  Resp: 13 10 12 13   Temp:  98.5 F (36.9 C)  98.5 F (36.9 C)  TempSrc:  Oral  Oral  SpO2: 96% 97% 96% 93%  Weight:      Height:        Wt Readings from Last 3  Encounters:  04/21/23 97 kg  04/07/23 75.8 kg  03/23/23 75.8 kg     Intake/Output Summary (Last 24 hours) at 04/22/2023 1026 Last data filed at 04/22/2023 0547 Gross per 24 hour  Intake 2911.03 ml  Output 3945 ml  Net -1033.97 ml     Physical Exam  Awake Alert, No new F.N deficits, Normal affect [place].AT,PERRAL Supple Neck, No JVD,   Symmetrical Chest wall movement, Good air movement bilaterally, CTAB RRR,No Gallops,Rubs or new Murmurs,  Hypoactive B.Sounds, Abd Soft, JP drain in place, mild postop  tenderness No Cyanosis, Clubbing or edema     Data Review:    Recent Labs  Lab 04/20/23 1245 04/21/23 0110 04/21/23 0402 04/21/23 1338 04/21/23 1725 04/21/23 2252 04/22/23 0612  WBC 15.1*  --  13.4*  --  28.5* 25.4* 19.0*  HGB 10.2*   < > 8.9* 10.9* 9.8* 8.5* 8.7*  HCT 31.0*   < > 27.5* 32.0* 29.3* 25.3* 26.1*  PLT 463*  --  403*  --  354 328 348  MCV 90.6  --  92.9  --  88.8 87.5 87.9  MCH 29.8  --  30.1  --  29.7 29.4 29.3  MCHC 32.9  --  32.4  --  33.4 33.6 33.3  RDW 13.5  --  13.5  --  14.1 14.0 14.0  LYMPHSABS  --   --   --   --   --   --  1.8  MONOABS  --   --   --   --   --   --  1.0  EOSABS  --   --   --   --   --   --  0.0  BASOSABS  --   --   --   --   --   --  0.0   < > = values in this interval not displayed.    Recent Labs  Lab 04/20/23 1245 04/21/23 0122 04/21/23 0354 04/21/23 0402 04/21/23 1338 04/22/23 0612  NA 132*  --   --  132* 131* 135  K 3.5  --   --  3.6 4.2 4.5  CL 97*  --   --  99  --  99  CO2 23  --   --  25  --  26  ANIONGAP 12  --   --  8  --  10  GLUCOSE 249*  --   --  134*  --  129*  BUN 14  --   --  11  --  9  CREATININE 0.72  --   --  0.60*  --  0.68  AST 36  --   --  18  --   --   ALT 99*  --   --  73*  --   --   ALKPHOS 117  --   --  98  --   --   BILITOT 0.5  --   --  0.7  --   --   ALBUMIN 3.2*  --   --  2.7*  --   --   CRP  --   --   --   --   --  12.1*  PROCALCITON  --   --   --   --   --  <0.10  LATICACIDVEN  --   --  0.7  --   --   --   INR  --  1.1  --   --   --   --  HGBA1C  --   --  7.0*  --   --   --   BNP  --   --   --   --   --  38.2  MG  --   --   --  1.9  --  1.8  CALCIUM 8.8*  --   --  8.8*  --  8.9      Recent Labs  Lab 04/20/23 1245 04/21/23 0122 04/21/23 0354 04/21/23 0402 04/22/23 0612  CRP  --   --   --   --  12.1*  PROCALCITON  --   --   --   --  <0.10  LATICACIDVEN  --   --  0.7  --   --   INR  --  1.1  --   --   --   HGBA1C  --   --  7.0*  --   --   BNP  --   --   --   --  38.2  MG  --    --   --  1.9 1.8  CALCIUM 8.8*  --   --  8.8* 8.9    --------------------------------------------------------------------------------------------------------------- Lab Results  Component Value Date   CHOL 220 (H) 12/03/2022   HDL 33 (L) 12/03/2022   LDLCALC 154 (H) 12/03/2022   TRIG 164 (H) 12/03/2022   CHOLHDL 6.7 12/03/2022    Lab Results  Component Value Date   HGBA1C 7.0 (H) 04/21/2023   No results for input(s): "TSH", "T4TOTAL", "FREET4", "T3FREE", "THYROIDAB" in the last 72 hours. No results for input(s): "VITAMINB12", "FOLATE", "FERRITIN", "TIBC", "IRON", "RETICCTPCT" in the last 72 hours. ------------------------------------------------------------------------------------------------------------------ Cardiac Enzymes No results for input(s): "CKMB", "TROPONINI", "MYOGLOBIN" in the last 168 hours.  Invalid input(s): "CK"  Micro Results No results found for this or any previous visit (from the past 240 hour(s)).  Radiology Reports DG Chest Port 1 View  Result Date: 04/22/2023 CLINICAL DATA:  Shortness of breath EXAM: PORTABLE CHEST 1 VIEW COMPARISON:  04/07/2023 FINDINGS: Enteric tube with tip at the lower esophagus. Elevated left diaphragm with mild streaky density behind the heart suggesting atelectasis. There is a perisplenic hematoma by prior CT. No edema, effusion, or pneumothorax. Normal heart size and aortic contours. Artifact from EKG leads. These results will be called to the ordering clinician or representative by the Radiologist Assistant, and communication documented in the PACS or [employer]. IMPRESSION: 1. Enteric tube with tip at the lower esophagus. 2. Elevated left diaphragm with mild atelectasis. Electronically Signed   By: Tiburcio Pea M.D.   On: 04/22/2023 07:01   CT Angio Chest/Abd/Pel for Dissection W and/or Wo Contrast  Result Date: 04/20/2023 CLINICAL DATA:  Left flank pain radiating to left shoulder. History of perisplenic hematoma. EXAM:  CT ANGIOGRAPHY CHEST, ABDOMEN AND PELVIS TECHNIQUE: Non-contrast CT of the chest was initially obtained. Multidetector CT imaging through the chest, abdomen and pelvis was performed using the standard protocol during bolus administration of intravenous contrast. Multiplanar reconstructed images and MIPs were obtained and reviewed to evaluate the vascular anatomy. RADIATION DOSE REDUCTION: This exam was performed according to the departmental dose-optimization program which includes automated exposure control, adjustment of the mA and/or kV according to patient size and/or use of iterative reconstruction technique. CONTRAST:  OMNIPAQUE IOHEXOL 350 MG/ML SOLN COMPARISON:  04/07/2023 FINDINGS: CTA CHEST FINDINGS Cardiovascular: Heart is normal size. Aorta is normal caliber. No dissection No filling defects in the pulmonary arteries to suggest pulmonary emboli. Mediastinum/Nodes: No mediastinal,  hilar, or axillary adenopathy. Trachea and esophagus are unremarkable. Thyroid unremarkable. Lungs/Pleura: Biapical scarring. Trace left pleural effusion. No confluent opacities or pneumothorax. Musculoskeletal: Chest wall soft tissues are unremarkable. No acute bony abnormality. Review of the MIP images confirms the above findings. CTA ABDOMEN AND PELVIS FINDINGS VASCULAR Aorta: Normal caliber aorta without aneurysm, dissection, vasculitis or significant stenosis. Celiac: Patent. 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 This is concerning for the source of the perisplenic hematoma. SMA: Patent without evidence of aneurysm, dissection, vasculitis or significant stenosis. Renals: Both renal arteries are patent without evidence of aneurysm, dissection, vasculitis, fibromuscular dysplasia or significant stenosis. IMA: Patent without evidence of aneurysm, dissection, vasculitis or significant stenosis. Inflow: Patent without  evidence of aneurysm, dissection, vasculitis or significant stenosis. Veins: No obvious venous abnormality within the limitations of this arterial phase study. Review of the MIP images confirms the above findings. NON-VASCULAR Hepatobiliary: No focal hepatic abnormality. Gallbladder unremarkable. Pancreas: Calcifications throughout the pancreas compatible with chronic pancreatitis. No evidence of acute pancreatitis or ductal dilatation. Spleen: Large perisplenic hematoma again noted, measuring approximately 13.5 x 6.2 cm. With measured in the same planes and at the same level on prior study, this measured approximately 13 x 4.4 cm. This is felt to be slightly larger than prior study. Adrenals/Urinary Tract: No adrenal abnormality. No focal renal abnormality. No stones or hydronephrosis. Urinary bladder is unremarkable. Stomach/Bowel: Normal appendix. Stomach, large and small bowel grossly unremarkable. Lymphatic: No adenopathy Reproductive: No visible focal abnormality. Other: No free fluid or free air. Musculoskeletal: No acute bony abnormality. Review of the MIP images confirms the above findings. IMPRESSION: 1.5 cm rounded enhancing hypervascular area adjacent to the splenic artery in the region of the splenic hilum adjacent to the pancreatic tail. 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 Large perisplenic hematoma, 13.5 x 6.2 cm, slightly increased in size since prior study. These results were called by telephone at the time of interpretation on 04/20/2023 at 4:33 pm to provider [facility] , who verbally acknowledged these results. Electronically Signed   By: Charlett Nose M.D.   On: 04/20/2023 16:34      Signature  -   Susa Raring M.D on 04/22/2023 at 10:26 AM   -  To page go to [email or web address]

## 2023-04-22 NOTE — TOC Progression Note (Signed)
Transition of Care [facility]) - Progression Note    Patient Details  Name: Samuel Abbott MRN: [id number] Date of Birth: [birth year]  [facility] CM/SW Contact  Debbe Crumble Reeves Forth, Student-Social Work Phone Number: 04/22/2023, 3:22 PM  Clinical Narrative:    MSW Intern spoke with patient about alcohol use. Patient stated his chart said he drank 53 drinks in a week and that was not the case, he only drinks 3-4 per day/ around 28 per week. MSW Intern offered patient resources and stated she just wanted to come speak to him and make sure he was presented with all the options and resources available. Client declined the resource packet.        Expected Discharge Plan and Services                                               Social Determinants of Health (SDOH) Interventions SDOH Screenings   Food Insecurity: No Food Insecurity (04/21/2023)  Housing: Patient Declined (04/21/2023)  Transportation Needs: No Transportation Needs (04/21/2023)  Utilities: Not At Risk (04/21/2023)  Tobacco Use: High Risk (04/21/2023)    Readmission Risk Interventions     No data to display

## 2023-04-22 NOTE — Plan of Care (Signed)

## 2023-04-22 NOTE — Discharge Instructions (Addendum)
[facility]      [facility], [place] [phone]  OPEN ABDOMINAL SURGERY: POST OP INSTRUCTIONS  Always review your discharge instruction sheet given to you by the facility where your surgery was performed.  IF YOU HAVE DISABILITY OR FAMILY LEAVE FORMS, YOU MUST BRING THEM TO THE OFFICE FOR PROCESSING.  PLEASE DO NOT GIVE THEM TO YOUR DOCTOR.  A prescription for pain medication may be given to you upon discharge.  Take your pain medication as prescribed, if needed.  If narcotic pain medicine is not needed, then you may take acetaminophen (Tylenol) or ibuprofen (Advil) as needed. Take your usually prescribed medications unless otherwise directed. If you need a refill on your pain medication, please contact your pharmacy. They will contact our office to request authorization.  Prescriptions will not be filled after 5pm or on week-ends. You should follow a light diet the first few days after arrival home, such as soup and crackers, pudding, etc.unless your doctor has advised otherwise. A high-fiber, low fat diet can be resumed as tolerated.   Be sure to include lots of fluids daily. Most patients will experience some swelling and bruising on the chest and neck area.  Ice packs will help.  Swelling and bruising can take several days to resolve Most patients will experience some swelling and bruising in the area of the incision. Ice pack will help. Swelling and bruising can take several days to resolve..  It is common to experience some constipation if taking pain medication after surgery.  Increasing fluid intake and taking a stool softener will usually help or prevent this problem from occurring.  A mild laxative (Milk of Magnesia or Miralax) should be taken according to package directions if there are no bowel movements after 48 hours.  You may have steri-strips (small skin tapes) in place directly over the incision.  These strips should be left on the skin for 7-10 days.  If your surgeon used skin  glue on the incision, you may shower in 24 hours.  The glue will flake off over the next 2-3 weeks.  Any sutures or staples will be removed at the office during your follow-up visit. You may find that a light gauze bandage over your incision may keep your staples from being rubbed or pulled. You may shower and replace the bandage daily. ACTIVITIES:  You may resume regular (light) daily activities beginning the next day--such as daily self-care, walking, climbing stairs--gradually increasing activities as tolerated.  You may have sexual intercourse when it is comfortable.  Refrain from any heavy lifting or straining until approved by your doctor. You may drive when you no longer are taking prescription pain medication, you can comfortably wear a seatbelt, and you can safely maneuver your car and apply brakes  You should see your doctor in the office for a follow-up appointment approximately two weeks after your surgery.  Make sure that you call for this appointment within a day or two after you arrive home to insure a convenient appointment time.   WHEN TO CALL YOUR DOCTOR: Fever over 101.0 Inability to urinate Nausea and/or vomiting Extreme swelling or bruising Continued bleeding from incision. Increased pain, redness, or drainage from the incision. Difficulty swallowing or breathing Muscle cramping or spasms. Numbness or tingling in hands or feet or around lips.  The clinic staff is available to answer your questions during regular business hours.  Please don't hesitate to call and ask to speak to one of the nurses if you have concerns.  For further questions, please visit [email or web address]     Continue your drain care as instructed by the surgeon.  Follow with Primary MD Norm Salt, PA in 4 days   Get CBC, CMP, 2 view Chest X ray -  checked next visit with your primary MD   Activity: As tolerated with Full fall precautions use walker/cane & assistance as  needed  Disposition Home   Diet: Heart Healthy   Special Instructions: If you have smoked or chewed Tobacco  in the last 2 yrs please stop smoking, stop any regular Alcohol  and or any Recreational drug use.  On your next visit with your primary care physician please Get Medicines reviewed and adjusted.  Please request your Prim.MD to go over all Hospital Tests and Procedure/Radiological results at the follow up, please get all Hospital records sent to your Prim MD by signing hospital release before you go home.  If you experience worsening of your admission symptoms, develop shortness of breath, life threatening emergency, suicidal or homicidal thoughts you must seek medical attention immediately by calling 911 or calling your MD immediately  if symptoms less severe.  You Must read complete instructions/literature along with all the possible adverse reactions/side effects for all the Medicines you take and that have been prescribed to you. Take any new Medicines after you have completely understood and accpet all the possible adverse reactions/side effects.   Do not drive when taking Pain medications.  Do not take more than prescribed Pain, Sleep and Anxiety Medications

## 2023-04-22 NOTE — Progress Notes (Signed)
Progress Note  1 Day Post-Op  Subjective: Pt complains of pain from NGT. He reports he is passing flatus. He has not eaten since Tuesday. He also reports having the sensation of needing to urinate a few times, asking about foley removal. He would like to walk today.   Objective: Vital signs in last 24 hours: Temp:  [97 F (36.1 C)-98.5 F (36.9 C)] 98.5 F (36.9 C) (10/10 0746) Pulse Rate:  [61-73] 73 (10/10 0746) Resp:  [8-18] 13 (10/10 0746) BP: (88-125)/(55-67) 113/63 (10/10 0746) SpO2:  [93 %-100 %] 93 % (10/10 0746) Last BM Date : 04/20/23  Intake/Output from previous day: 10/09 0701 - 10/10 0700 In: 2911 [I.V.:1100; Blood:630; IV Piggyback:1181] Out: 3945 [Urine:1825; Drains:220; Blood:1900] Intake/Output this shift: No intake/output data recorded.  PE: General: WD, WN male who is laying in bed in NAD HEENT: sclera anicteric Heart: regular, rate, and rhythm.   Lungs: Respiratory effort nonlabored Abd: soft, appropriately ttp, ND, +BS, incision with honeycomb dressing present, JP with bloody fluid, NGT with no output GU: foley with straw colored urine  Psych: A&Ox3 with an appropriate affect.    Lab Results:  Recent Labs    04/21/23 2252 04/22/23 0612  WBC 25.4* 19.0*  HGB 8.5* 8.7*  HCT 25.3* 26.1*  PLT 328 348   BMET Recent Labs    04/21/23 0402 04/21/23 1338 04/22/23 0612  NA 132* 131* 135  K 3.6 4.2 4.5  CL 99  --  99  CO2 25  --  26  GLUCOSE 134*  --  129*  BUN 11  --  9  CREATININE 0.60*  --  0.68  CALCIUM 8.8*  --  8.9   PT/INR Recent Labs    04/21/23 0122  LABPROT 13.9  INR 1.1   CMP     Component Value Date/Time   NA 135 04/22/2023 0612   K 4.5 04/22/2023 0612   CL 99 04/22/2023 0612   CO2 26 04/22/2023 0612   GLUCOSE 129 (H) 04/22/2023 0612   BUN 9 04/22/2023 0612   CREATININE 0.68 04/22/2023 0612   CALCIUM 8.9 04/22/2023 0612   PROT 6.0 (L) 04/21/2023 0402   ALBUMIN 2.7 (L) 04/21/2023 0402   AST 18 04/21/2023 0402    ALT 73 (H) 04/21/2023 0402   ALKPHOS 98 04/21/2023 0402   BILITOT 0.7 04/21/2023 0402   GFRNONAA >60 04/22/2023 0612   GFRAA >60 09/10/2018 0401   Lipase     Component Value Date/Time   LIPASE 104 (H) 04/20/2023 1456       Studies/Results: DG Chest Port 1 View  Result Date: 04/22/2023 CLINICAL DATA:  Shortness of breath EXAM: PORTABLE CHEST 1 VIEW COMPARISON:  04/07/2023 FINDINGS: Enteric tube with tip at the lower esophagus. Elevated left diaphragm with mild streaky density behind the heart suggesting atelectasis. There is a perisplenic hematoma by prior CT. No edema, effusion, or pneumothorax. Normal heart size and aortic contours. Artifact from EKG leads. These results will be called to the ordering clinician or representative by the Radiologist Assistant, and communication documented in the PACS or [employer]. IMPRESSION: 1. Enteric tube with tip at the lower esophagus. 2. Elevated left diaphragm with mild atelectasis. Electronically Signed   By: Tiburcio Pea M.D.   On: 04/22/2023 07:01   CT Angio Chest/Abd/Pel for Dissection W and/or Wo Contrast  Result Date: 04/20/2023 CLINICAL DATA:  Left flank pain radiating to left shoulder. History of perisplenic hematoma. EXAM: CT ANGIOGRAPHY CHEST, ABDOMEN AND PELVIS TECHNIQUE:  Non-contrast CT of the chest was initially obtained. Multidetector CT imaging through the chest, abdomen and pelvis was performed using the standard protocol during bolus administration of intravenous contrast. Multiplanar reconstructed images and MIPs were obtained and reviewed to evaluate the vascular anatomy. RADIATION DOSE REDUCTION: This exam was performed according to the departmental dose-optimization program which includes automated exposure control, adjustment of the mA and/or kV according to patient size and/or use of iterative reconstruction technique. CONTRAST:  OMNIPAQUE IOHEXOL 350 MG/ML SOLN COMPARISON:  04/07/2023 FINDINGS: CTA CHEST FINDINGS  Cardiovascular: Heart is normal size. Aorta is normal caliber. No dissection No filling defects in the pulmonary arteries to suggest pulmonary emboli. Mediastinum/Nodes: No mediastinal, hilar, or axillary adenopathy. Trachea and esophagus are unremarkable. Thyroid unremarkable. Lungs/Pleura: Biapical scarring. Trace left pleural effusion. No confluent opacities or pneumothorax. Musculoskeletal: Chest wall soft tissues are unremarkable. No acute bony abnormality. Review of the MIP images confirms the above findings. CTA ABDOMEN AND PELVIS FINDINGS VASCULAR Aorta: Normal caliber aorta without aneurysm, dissection, vasculitis or significant stenosis. Celiac: Patent. 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 This is concerning for the source of the perisplenic hematoma. SMA: Patent without evidence of aneurysm, dissection, vasculitis or significant stenosis. Renals: Both renal arteries are patent without evidence of aneurysm, dissection, vasculitis, fibromuscular dysplasia or significant stenosis. IMA: Patent without evidence of aneurysm, dissection, vasculitis or significant stenosis. Inflow: Patent without evidence of aneurysm, dissection, vasculitis or significant stenosis. Veins: No obvious venous abnormality within the limitations of this arterial phase study. Review of the MIP images confirms the above findings. NON-VASCULAR Hepatobiliary: No focal hepatic abnormality. Gallbladder unremarkable. Pancreas: Calcifications throughout the pancreas compatible with chronic pancreatitis. No evidence of acute pancreatitis or ductal dilatation. Spleen: Large perisplenic hematoma again noted, measuring approximately 13.5 x 6.2 cm. With measured in the same planes and at the same level on prior study, this measured approximately 13 x 4.4 cm. This is felt to be slightly larger than prior study. Adrenals/Urinary Tract: No adrenal  abnormality. No focal renal abnormality. No stones or hydronephrosis. Urinary bladder is unremarkable. Stomach/Bowel: Normal appendix. Stomach, large and small bowel grossly unremarkable. Lymphatic: No adenopathy Reproductive: No visible focal abnormality. Other: No free fluid or free air. Musculoskeletal: No acute bony abnormality. Review of the MIP images confirms the above findings. IMPRESSION: 1.5 cm rounded enhancing hypervascular area adjacent to the splenic artery in the region of the splenic hilum adjacent to the pancreatic tail. 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 Large perisplenic hematoma, 13.5 x 6.2 cm, slightly increased in size since prior study. These results were called by telephone at the time of interpretation on 04/20/2023 at 4:33 pm to provider [facility] , who verbally acknowledged these results. Electronically Signed   By: Charlett Nose M.D.   On: 04/20/2023 16:34    Anti-infectives: Anti-infectives (From admission, onward)    Start     Dose/Rate Route Frequency Ordered Stop   04/21/23 0945  ciprofloxacin (CIPRO) IVPB 400 mg        400 mg 200 mL/hr over 60 Minutes Intravenous On call to O.R. 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 or [facility] tomorrow if hgb stable  ID: cipro/flagyl  LOS: 2 days     Juliet Rude, [employer] [facility] 04/22/2023, 11:09 AM Please see Amion for pager number during day hours 7:00am-4:30pm

## 2023-04-22 NOTE — Care Management (Signed)
Transition of Care [facility]) - Inpatient Brief Assessment   Patient Details  Name: Samuel Abbott MRN: [id number] Date of Birth: 1960/09/18  Transition of Care [facility]) CM/SW Contact:    Lockie Pares, RN Phone Number: 04/22/2023, 3:13 PM   Clinical Narrative:  62 year old patient with open splenectomy. PT assessment reveals that patient may need some DME.Patient encouraged to stop ETOH and smoking by MD. Still has NG tube. Lives in apartment, has son in the area.  TOC will follow for needs, recommendations, and transitions of care.  Transition of Care Asessment: Insurance and Status: Insurance coverage has been reviewed Patient has primary care physician: Yes Home environment has been reviewed: Apartment Prior level of function:: Independent Prior/Current Home Services: No current home services   Readmission risk has been reviewed: Yes Transition of care needs: transition of care needs identified, TOC will continue to follow

## 2023-04-23 ENCOUNTER — Other Ambulatory Visit (HOSPITAL_COMMUNITY): Payer: Self-pay

## 2023-04-23 DIAGNOSIS — D735 Infarction of spleen: Secondary | ICD-10-CM | POA: Diagnosis not present

## 2023-04-23 LAB — AMYLASE, PLEURAL OR PERITONEAL FLUID

## 2023-04-23 LAB — SURGICAL PATHOLOGY

## 2023-04-23 LAB — CBC WITH DIFFERENTIAL/PLATELET
Abs Immature Granulocytes: 0.12 10*3/uL — ABNORMAL HIGH (ref 0.00–0.07)
Basophils Absolute: 0.1 10*3/uL (ref 0.0–0.1)
Basophils Relative: 0 %
Eosinophils Absolute: 0.2 10*3/uL (ref 0.0–0.5)
Eosinophils Relative: 2 %
HCT: 20.9 % — ABNORMAL LOW (ref 39.0–52.0)
Hemoglobin: 7 g/dL — ABNORMAL LOW (ref 13.0–17.0)
Immature Granulocytes: 1 %
Lymphocytes Relative: 20 %
Lymphs Abs: 3 10*3/uL (ref 0.7–4.0)
MCH: 29.5 pg (ref 26.0–34.0)
MCHC: 33.5 g/dL (ref 30.0–36.0)
MCV: 88.2 fL (ref 80.0–100.0)
Monocytes Absolute: 1.6 10*3/uL — ABNORMAL HIGH (ref 0.1–1.0)
Monocytes Relative: 10 %
Neutro Abs: 10.3 10*3/uL — ABNORMAL HIGH (ref 1.7–7.7)
Neutrophils Relative %: 67 %
Platelets: 322 10*3/uL (ref 150–400)
RBC: 2.37 MIL/uL — ABNORMAL LOW (ref 4.22–5.81)
RDW: 13.8 % (ref 11.5–15.5)
WBC: 15.3 10*3/uL — ABNORMAL HIGH (ref 4.0–10.5)
nRBC: 0 % (ref 0.0–0.2)

## 2023-04-23 LAB — PROCALCITONIN: Procalcitonin: <0.1 ng/mL

## 2023-04-23 LAB — BASIC METABOLIC PANEL
Anion gap: 8 (ref 5–15)
BUN: 9 mg/dL (ref 6–20)
CO2: 27 mmol/L (ref 22–32)
Calcium: 8.5 mg/dL — ABNORMAL LOW (ref 8.9–10.3)
Chloride: 97 mmol/L — ABNORMAL LOW (ref 98–111)
Creatinine, Ser: 0.54 mg/dL — ABNORMAL LOW (ref 0.61–1.24)
GFR, Estimated: >60 mL/min (ref >60)
Glucose, Bld: 117 mg/dL — ABNORMAL HIGH (ref 70–99)
Potassium: 4.1 mmol/L (ref 3.5–5.1)
Sodium: 132 mmol/L — ABNORMAL LOW (ref 135–145)

## 2023-04-23 LAB — BRAIN NATRIURETIC PEPTIDE: B Natriuretic Peptide: 46.1 pg/mL (ref 0.0–100.0)

## 2023-04-23 LAB — GLUCOSE, CAPILLARY
Glucose-Capillary: 114 mg/dL — ABNORMAL HIGH (ref 70–99)
Glucose-Capillary: 114 mg/dL — ABNORMAL HIGH (ref 70–99)

## 2023-04-23 LAB — CBC
HCT: 23 % — ABNORMAL LOW (ref 39.0–52.0)
Hemoglobin: 7.9 g/dL — ABNORMAL LOW (ref 13.0–17.0)
MCH: 30.9 pg (ref 26.0–34.0)
MCHC: 34.3 g/dL (ref 30.0–36.0)
MCV: 89.8 fL (ref 80.0–100.0)
Platelets: 349 10*3/uL (ref 150–400)
RBC: 2.56 MIL/uL — ABNORMAL LOW (ref 4.22–5.81)
RDW: 13.6 % (ref 11.5–15.5)
WBC: 15.7 10*3/uL — ABNORMAL HIGH (ref 4.0–10.5)
nRBC: 0 % (ref 0.0–0.2)

## 2023-04-23 LAB — MAGNESIUM: Magnesium: 1.7 mg/dL (ref 1.7–2.4)

## 2023-04-23 LAB — C-REACTIVE PROTEIN: CRP: 6.2 mg/dL — ABNORMAL HIGH (ref <1.0)

## 2023-04-23 LAB — AMYLASE: Amylase: 73 U/L (ref 28–100)

## 2023-04-23 LAB — PREPARE RBC (CROSSMATCH)

## 2023-04-23 MED ORDER — ONDANSETRON HCL 4 MG PO TABS
4.0000 mg | ORAL_TABLET | Freq: Three times a day (TID) | ORAL | 0 refills | Status: DC | PRN
  Filled 2023-04-23: qty 12, 4d supply, fill #0

## 2023-04-23 MED ORDER — SENNOSIDES-DOCUSATE SODIUM 8.6-50 MG PO TABS
2.0000 | ORAL_TABLET | Freq: Every evening | ORAL | 0 refills | Status: DC | PRN
  Filled 2023-04-23: qty 15, 7d supply, fill #0

## 2023-04-23 MED ORDER — ACETAMINOPHEN 500 MG PO TABS: 1000.0000 mg | ORAL_TABLET | Freq: Four times a day (QID) | ORAL | Status: AC | PRN

## 2023-04-23 MED ORDER — POLYETHYLENE GLYCOL 3350 17 GM/SCOOP PO POWD
17.0000 g | Freq: Every day | ORAL | 0 refills | Status: DC
  Filled 2023-04-23: qty 238, 14d supply, fill #0

## 2023-04-23 MED ORDER — HAEMOPHILUS B POLYSAC CONJ VAC 10 MCG IJ SOLR
0.5000 mL | Freq: Once | INTRAMUSCULAR | Status: DC
  Filled 2023-04-23: qty 0.5

## 2023-04-23 MED ORDER — LEVOFLOXACIN 750 MG PO TABS
750.0000 mg | ORAL_TABLET | Freq: Every day | ORAL | 0 refills | Status: DC
  Filled 2023-04-23: qty 4, 4d supply, fill #0

## 2023-04-23 MED ORDER — HYDROMORPHONE HCL 1 MG/ML IJ SOLN
1.0000 mg | INTRAMUSCULAR | Status: DC | PRN
  Administered 2023-04-23 (×2): 1 mg via INTRAVENOUS
  Filled 2023-04-23 (×2): qty 1

## 2023-04-23 MED ORDER — HAEMOPHILUS B POLYSAC CONJ VAC 10 MCG IJ SOLR
0.5000 mL | Freq: Once | INTRAMUSCULAR | Status: AC
  Administered 2023-04-23: 0.5 mL via INTRAMUSCULAR
  Filled 2023-04-23: qty 0.5

## 2023-04-23 MED ORDER — OXYCODONE HCL 10 MG PO TABS
10.0000 mg | ORAL_TABLET | Freq: Four times a day (QID) | ORAL | 0 refills | Status: DC | PRN
Start: 2023-04-23 — End: 2023-05-20
  Filled 2023-04-23: qty 28, 7d supply, fill #0

## 2023-04-23 MED ORDER — PNEUMOCOCCAL 20-VAL CONJ VACC 0.5 ML IM SUSY
0.5000 mL | PREFILLED_SYRINGE | INTRAMUSCULAR | Status: DC
  Filled 2023-04-23: qty 0.5

## 2023-04-23 MED ORDER — MENINGOCOCCAL VAC B (OMV) IM SUSY
0.5000 mL | PREFILLED_SYRINGE | Freq: Once | INTRAMUSCULAR | Status: AC
  Administered 2023-04-23: 0.5 mL via INTRAMUSCULAR
  Filled 2023-04-23: qty 0.5

## 2023-04-23 MED ORDER — SODIUM CHLORIDE 0.9% IV SOLUTION: Freq: Once | INTRAVENOUS | Status: AC

## 2023-04-23 MED ORDER — METHOCARBAMOL 500 MG PO TABS
500.0000 mg | ORAL_TABLET | Freq: Three times a day (TID) | ORAL | 1 refills | Status: DC | PRN
  Filled 2023-04-23: qty 60, 20d supply, fill #0

## 2023-04-23 MED ORDER — LEVOFLOXACIN 750 MG PO TABS
750.0000 mg | ORAL_TABLET | Freq: Every day | ORAL | Status: DC
  Administered 2023-04-23: 750 mg via ORAL
  Filled 2023-04-23: qty 1

## 2023-04-23 MED ORDER — OXYCODONE HCL 5 MG PO TABS
10.0000 mg | ORAL_TABLET | Freq: Four times a day (QID) | ORAL | Status: DC | PRN
  Administered 2023-04-23: 10 mg via ORAL
  Filled 2023-04-23: qty 2

## 2023-04-23 NOTE — Progress Notes (Signed)
Mobility Specialist Progress Note;   04/23/23 1140  Mobility  Activity Ambulated independently in hallway  Level of Assistance Independent  Assistive Device None  Distance Ambulated (ft) 200 ft  Activity Response Tolerated fair  Mobility Referral Yes  $Mobility charge 1 Mobility  Mobility Specialist Start Time (ACUTE ONLY) 1140  Mobility Specialist Stop Time (ACUTE ONLY) 1150  Mobility Specialist Time Calculation (min) (ACUTE ONLY) 10 min   Pt received ambulating in hallway at a ModI level. C/o surgical site pain with all movement. No assist required. Pt eager for d/c.   Caesar Bookman Mobility Specialist Please contact via SecureChat or Rehab Office [phone]

## 2023-04-23 NOTE — TOC Transition Note (Signed)
Transition of Care [facility]) - CM/SW Discharge Note   Patient Details  Name: Samuel Abbott MRN: [id number] Date of Birth: [birth year]  Transition of Care [facility]) CM/SW Contact:  Lockie Pares, RN Phone Number: 04/23/2023, 11:37 AM   Clinical Narrative:     Patient discharging today after transfusion. Late afternoon discharge, ordered rolling walker via [employer]. They will bring to room prior to DC  NO further needs identified    Barriers to Discharge: No Barriers Identified   Patient Goals and CMS Choice      Discharge Placement    Home self care                     Discharge Plan and Services Additional resources added to the After Visit Summary for                  DME Arranged: Walker rolling DME Agency: [employer] Date DME Agency Contacted: 04/23/23 Time DME Agency Contacted: 1137 Representative spoke with at DME Agency: Vaughan Basta            Social Determinants of Health (SDOH) Interventions SDOH Screenings   Food Insecurity: No Food Insecurity (04/21/2023)  Housing: Patient Declined (04/21/2023)  Transportation Needs: No Transportation Needs (04/21/2023)  Utilities: Not At Risk (04/21/2023)  Tobacco Use: High Risk (04/21/2023)     Readmission Risk Interventions     No data to display

## 2023-04-23 NOTE — Progress Notes (Signed)
2 Days Post-Op   Subjective/Chief Complaint: Patient sitting up breathing comfortable.  Does have pain issues he states but he seems overall comfortable.  No nausea or vomiting and tolerating a diet.   Objective: Vital signs in last 24 hours: Temp:  [97.5 F (36.4 C)-98.4 F (36.9 C)] 98 F (36.7 C) (10/11 0400) Pulse Rate:  [60-70] 64 (10/11 0400) Resp:  [10-18] 10 (10/11 0400) BP: (108-133)/(58-67) 108/65 (10/11 0400) SpO2:  [96 %-98 %] 96 % (10/11 0400) Last BM Date : 04/20/23  Intake/Output from previous day: 10/10 0701 - 10/11 0700 In: 200 [IV Piggyback:200] Out: 125 [Drains:125] Intake/Output this shift: No intake/output data recorded.  Abdomen: Left upper quadrant incision with honeycomb clean dry intact staples in place.  JP shows scant old blood.  No bright red blood.  Lab Results:  Recent Labs    04/22/23 0612 04/23/23 0432  WBC 19.0* 15.3*  HGB 8.7* 7.0*  HCT 26.1* 20.9*  PLT 348 322   BMET Recent Labs    04/22/23 0612 04/23/23 0432  NA 135 132*  K 4.5 4.1  CL 99 97*  CO2 26 27  GLUCOSE 129* 117*  BUN 9 9  CREATININE 0.68 0.54*  CALCIUM 8.9 8.5*   PT/INR Recent Labs    04/21/23 0122  LABPROT 13.9  INR 1.1   ABG Recent Labs    04/21/23 1338  HCO3 24.6    Studies/Results: DG Chest Port 1 View  Result Date: 04/22/2023 CLINICAL DATA:  Shortness of breath EXAM: PORTABLE CHEST 1 VIEW COMPARISON:  04/07/2023 FINDINGS: Enteric tube with tip at the lower esophagus. Elevated left diaphragm with mild streaky density behind the heart suggesting atelectasis. There is a perisplenic hematoma by prior CT. No edema, effusion, or pneumothorax. Normal heart size and aortic contours. Artifact from EKG leads. These results will be called to the ordering clinician or representative by the Radiologist Assistant, and communication documented in the PACS or [employer]. IMPRESSION: 1. Enteric tube with tip at the lower esophagus. 2. Elevated left diaphragm  with mild atelectasis. Electronically Signed   By: Tiburcio Pea M.D.   On: 04/22/2023 07:01    Anti-infectives: Anti-infectives (From admission, onward)    Start     Dose/Rate Route Frequency Ordered Stop   04/23/23 1000  levofloxacin (LEVAQUIN) tablet 750 mg        750 mg Oral Daily 04/23/23 0603 04/27/23 0959   04/22/23 1215  ciprofloxacin (CIPRO) IVPB 400 mg        400 mg 200 mL/hr over 60 Minutes Intravenous Every 12 hours 04/22/23 1117 04/22/23 2211   04/22/23 1130  metroNIDAZOLE (FLAGYL) IVPB 500 mg        500 mg 100 mL/hr over 60 Minutes Intravenous Every 12 hours 04/22/23 1117     04/21/23 0945  ciprofloxacin (CIPRO) IVPB 400 mg        400 mg 200 mL/hr over 60 Minutes Intravenous On call to O.R. 04/21/23 0848 04/21/23 1148       Assessment/Plan: s/p Procedure(s): OPEN SPLENECTOMY (N/A) Hemoglobin 7 today.  Recommend transfusion today.  JP drain is slowing, check for amylase  Patient needs postsplenectomy vaccinations prior to discharge  Advance to regular diet  Adjust pain medications due to alcohol use in the past.  We had a long discussion about this.  He denies drinking 52 alcoholic drinks a week but states he drinks 3-4 beers a day.  I explained that his pain management may be different due to tolerance.  Adjust Oxy to 10 mg every 6 hours and use Dilaudid 1 mg every 2 was backup.  Hopefully should go home over the weekend if his hemoglobin stabilizes.  JP drain will remain in place for now since he is at high risk for pancreatic tail leak which have discussed with him today.  Information for FMLA given to patient to contact [employer] for assistance.  LOS: 3 days    Dortha Schwalbe MD 04/23/2023

## 2023-04-23 NOTE — Plan of Care (Signed)
  Problem: Education: Goal: Knowledge of General Education information will improve Description: Including pain rating scale, medication(s)/side effects and non-pharmacologic comfort measures Outcome: Progressing   Problem: Health Behavior/Discharge Planning: Goal: Ability to manage health-related needs will improve Outcome: Progressing   Problem: Clinical Measurements: Goal: Ability to maintain clinical measurements within normal limits will improve Outcome: Progressing Goal: Will remain free from infection Outcome: Progressing Goal: Diagnostic test results will improve Outcome: Progressing Goal: Respiratory complications will improve Outcome: Progressing Goal: Cardiovascular complication will be avoided Outcome: Progressing   Problem: Activity: Goal: Risk for activity intolerance will decrease Outcome: Progressing   Problem: Nutrition: Goal: Adequate nutrition will be maintained Outcome: Progressing   Problem: Coping: Goal: Level of anxiety will decrease Outcome: Progressing   Problem: Elimination: Goal: Will not experience complications related to bowel motility Outcome: Progressing Goal: Will not experience complications related to urinary retention Outcome: Progressing   Problem: Pain Managment: Goal: General experience of comfort will improve Outcome: Progressing   Problem: Safety: Goal: Ability to remain free from injury will improve Outcome: Progressing   Problem: Skin Integrity: Goal: Risk for impaired skin integrity will decrease Outcome: Progressing   Problem: Activity: Goal: Ability to return to normal activity level will improve to the fullest extent possible by discharge Outcome: Progressing   Problem: Education: Goal: Knowledge of medication regimen will be met for pain relief regimen by discharge Outcome: Progressing Goal: Understanding of ways to prevent infection will improve by discharge Outcome: Progressing   Problem: Coping: Goal:  Ability to verbalize feelings will improve by discharge Outcome: Progressing Goal: Family members realistic understanding of the patients condition will improve by discharge Outcome: Progressing   Problem: Fluid Volume: Goal: Maintenance of adequate hydration will improve by discharge Outcome: Progressing   Problem: Medication: Goal: Compliance with prescribed medication regimen will improve by discharge Outcome: Progressing   Problem: Physical Regulation: Goal: Hemodynamic stability will return to baseline for the patient by discharge Outcome: Progressing Goal: Diagnostic test results will improve Outcome: Progressing Goal: Will remain free from infection Outcome: Progressing   Problem: Respiratory: Goal: Ability to maintain adequate oxygenation and ventilation will improve by discharge Outcome: Progressing   Problem: Pain Management: Goal: Satisfaction with pain management regimen will be met by discharge Outcome: Progressing   Problem: Education: Goal: Ability to describe self-care measures that may prevent or decrease complications (Diabetes Survival Skills Education) will improve Outcome: Progressing Goal: Individualized Educational Video(s) Outcome: Progressing   Problem: Coping: Goal: Ability to adjust to condition or change in health will improve Outcome: Progressing   Problem: Fluid Volume: Goal: Ability to maintain a balanced intake and output will improve Outcome: Progressing   Problem: Health Behavior/Discharge Planning: Goal: Ability to identify and utilize available resources and services will improve Outcome: Progressing Goal: Ability to manage health-related needs will improve Outcome: Progressing   Problem: Metabolic: Goal: Ability to maintain appropriate glucose levels will improve Outcome: Progressing   Problem: Nutritional: Goal: Maintenance of adequate nutrition will improve Outcome: Progressing Goal: Progress toward achieving an optimal  weight will improve Outcome: Progressing   Problem: Skin Integrity: Goal: Risk for impaired skin integrity will decrease Outcome: Progressing   Problem: Tissue Perfusion: Goal: Adequacy of tissue perfusion will improve Outcome: Progressing

## 2023-04-23 NOTE — Discharge Summary (Signed)
Samuel Abbott KZS:[id number] DOB: 1960-12-05 DOA: 04/20/2023  PCP: Norm Salt, PA  Admit date: 04/20/2023  Discharge date: 04/23/2023  Admitted From: Home   Disposition:  Home   Recommendations for Outpatient Follow-up:   Follow up with PCP in 1-2 weeks  PCP Please obtain BMP/CBC, 2 view CXR in 1week,  (see Discharge instructions)   PCP Please follow up on the following pending results:    Home Health: None   Equipment/Devices: None  Consultations: CCS  Discharge Condition: Stable    CODE STATUS: Full    Diet Recommendation: Heart Healthy     Chief Complaint  Patient presents with   Flank Pain   Shoulder Pain     Brief history of present illness from the day of admission and additional interim summary    62 y.o. male with medical history significant for hypertension, hyperlipidemia, alcohol and tobacco use, history of chronic pancreatitis, pancreatic pseudocyst, recent admission (9/25-9/27/24) for contained spontaneous splenic rupture with hematoma, who initially presented to [facility] ED with complaints of left upper abdomen and left flank pain.  His symptoms have been progressively worsening for the past 3 to 4 days.  Denies any subjective fevers or chills.  Further workup showed continued extravasation of blood into the spleen possibly from a neighboring aneurysm.  General surgery was consulted and he was admitted to the hospital.                                                                   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 Luisa Hart and Barnetta Chapel, repeat CBC this morning stable after initial following this morning which could be error, discharge at home with instructions of drain care given by general surgery, patient will be provided narcotics by general surgery.  Of note he has exhibited narcotic seeking behavior consistent with previous hospital visit and during this hospitalization as well.  Request PCP to be cautious with dispensing narcotics   Mild hypovolemic hyponatremia Mild due to dehydration hydrated with IV fluids   Chronic anxiety/depression Resume home regimen.   Essential hypertension BPs stable now continue home regimen   Chronic pancreatitis Lipase 104 Continue to monitor for symptomatology Consult to quit alcohol use   History of alcohol abuse and smoking No evidence of alcohol withdrawal at the time, strictly counseled to quit alcohol and smoking.   QTc prolongation Resolved after electrolyte replacement   Type 2 diabetes with hyperglycemia  Continue  home regimen. Discharge diagnosis     Principal Problem:   Splenic hemorrhage    Discharge instructions    Discharge Instructions     Ambulatory Referral for Lung Cancer Scre   Complete by: As directed    Diet - low sodium heart healthy   Complete by: As directed    Discharge instructions   Complete by: As directed    Continue your drain care as instructed by the surgeon.  Follow with Primary MD Norm Salt, PA in 4 days   Get CBC, CMP, 2 view Chest X ray -  checked next visit with your primary MD   Activity: As tolerated with Full fall precautions use walker/cane & assistance as needed  Disposition Home   Diet: Heart Healthy   Special Instructions: If you have smoked or chewed Tobacco  in the last 2 yrs please stop smoking, stop any regular Alcohol  and or  any Recreational drug use.  On your next visit with your primary care physician please Get Medicines reviewed and adjusted.  Please request your Prim.MD to go over all Hospital Tests and Procedure/Radiological results at the follow up, please get all Hospital records sent to your Prim MD by signing hospital release before you go home.  If you experience worsening of your admission symptoms, develop shortness of breath, life threatening emergency, suicidal or homicidal thoughts you must seek medical attention immediately by calling 911 or calling your MD immediately  if symptoms less severe.  You Must read complete instructions/literature along with all the possible adverse reactions/side effects for all the Medicines you take and that have been prescribed to you. Take any new Medicines after you have completely understood and accpet all the possible adverse reactions/side effects.   Do not drive when taking Pain medications.  Do not take more than prescribed Pain, Sleep and Anxiety Medications   Increase activity slowly   Complete by: As directed    Splenectomy Post Discharge Vaccine Instructions   Complete by: As directed     The information listed below shows vaccinations which help protect your health if you do not have a functioning spleen. Follow up with a primary care provider, health department, or local pharmacy to complete the vaccination series.  Two months - Meningococcal conjugate (MenACWY) AND Meningococcal B (MenB)  One year - Meningococcal B (MenB)  Three years - Meningococcal B (MenB)  Five years - Meningococcal conjugate (MenACWY)       Discharge Medications   Allergies as of 04/23/2023       Reactions   Amoxil [amoxicillin] Anaphylaxis   Penicillins Anaphylaxis   Motrin [ibuprofen] Other (See Comments)   Stomach pain after several uses of 800mg  tablets.        Medication List     STOP taking these medications    oxyCODONE-acetaminophen 5-325 MG  tablet Commonly known as: PERCOCET/ROXICET   tiZANidine 4 MG tablet Commonly known as: ZANAFLEX       TAKE these medications    acetaminophen 500 MG tablet Commonly known as: TYLENOL Take 2 tablets (1,000 mg total) by mouth every 6 (six) hours as needed for mild pain, moderate pain, fever or headache.   DULoxetine 20 MG capsule Commonly known as: CYMBALTA Take 20 mg by mouth daily.   levofloxacin 750 MG tablet Commonly known as: LEVAQUIN Take 1 tablet (750 mg total) by mouth daily. Start taking on: April 24, 2023   losartan 25 MG tablet Commonly known as: COZAAR Take 25 mg by  mouth daily.   metFORMIN 500 MG tablet Commonly known as: Glucophage Take 1 tablet (500 mg total) by mouth daily with breakfast. What changed:  how much to take when to take this   methocarbamol 500 MG tablet Commonly known as: ROBAXIN Take 1 tablet (500 mg total) by mouth every 8 (eight) hours as needed for muscle spasms.   multivitamin tablet Take 1 tablet by mouth daily.   ondansetron 4 MG tablet Commonly known as: Zofran Take 1 tablet (4 mg total) by mouth every 8 (eight) hours as needed for nausea or vomiting.   Oxycodone HCl 10 MG Tabs Take 1 tablet (10 mg total) by mouth every 6 (six) hours as needed for moderate pain.   polyethylene glycol 17 g packet Commonly known as: MIRALAX / GLYCOLAX Take 17 g by mouth daily.   senna-docusate 8.6-50 MG tablet Commonly known as: Senokot-S Take 2 tablets by mouth at bedtime as needed for mild constipation. What changed:  when to take this reasons to take this         Follow-up Information     Cornett, Maisie Fus, MD Follow up on 05/17/2023.   Specialty: General Surgery Why: 10:40am, Arrive 15 minutes prior to your appointment time, Please bring your insurance card and photo ID Contact information: [address] [place] [address] [phone]         Norm Salt, [place]. Schedule an appointment as soon as  possible for a visit in 4 day(s).   Specialty: Physician Assistant Contact information: [address] [place] [address] [phone]         Sur[facility], [facility] Follow up on 05/04/2023.   Specialty: General Surgery Why: 10:15am, Arrive 30 minutes prior to your appointment time, Please bring your insurance card and photo ID Contact information: [address] [place] [address] [phone]                 Major procedures and Radiology Reports - PLEASE review detailed and final reports thoroughly  -     DG Chest Port 1 View  Result Date: 04/22/2023 CLINICAL DATA:  Shortness of breath EXAM: PORTABLE CHEST 1 VIEW COMPARISON:  04/07/2023 FINDINGS: Enteric tube with tip at the lower esophagus. Elevated left diaphragm with mild streaky density behind the heart suggesting atelectasis. There is a perisplenic hematoma by prior CT. No edema, effusion, or pneumothorax. Normal heart size and aortic contours. Artifact from EKG leads. These results will be called to the ordering clinician or representative by the Radiologist Assistant, and communication documented in the PACS or [employer]. IMPRESSION: 1. Enteric tube with tip at the lower esophagus. 2. Elevated left diaphragm with mild atelectasis. Electronically Signed   By: Tiburcio Pea M.D.   On: 04/22/2023 07:01   CT Angio Chest/Abd/Pel for Dissection W and/or Wo Contrast  Result Date: 04/20/2023 CLINICAL DATA:  Left flank pain radiating to left shoulder. History of perisplenic hematoma. EXAM: CT ANGIOGRAPHY CHEST, ABDOMEN AND PELVIS TECHNIQUE: Non-contrast CT of the chest was initially obtained. Multidetector CT imaging through the chest, abdomen and pelvis was performed using the standard protocol during bolus administration of intravenous contrast. Multiplanar reconstructed images and MIPs were obtained and reviewed to evaluate the vascular anatomy. RADIATION DOSE REDUCTION: This exam was performed  according to the departmental dose-optimization program which includes automated exposure control, adjustment of the mA and/or kV according to patient size and/or use of iterative reconstruction technique. CONTRAST:  OMNIPAQUE IOHEXOL 350  MG/ML SOLN COMPARISON:  04/07/2023 FINDINGS: CTA CHEST FINDINGS Cardiovascular: Heart is normal size. Aorta is normal caliber. No dissection No filling defects in the pulmonary arteries to suggest pulmonary emboli. Mediastinum/Nodes: No mediastinal, hilar, or axillary adenopathy. Trachea and esophagus are unremarkable. Thyroid unremarkable. Lungs/Pleura: Biapical scarring. Trace left pleural effusion. No confluent opacities or pneumothorax. Musculoskeletal: Chest wall soft tissues are unremarkable. No acute bony abnormality. Review of the MIP images confirms the above findings. CTA ABDOMEN AND PELVIS FINDINGS VASCULAR Aorta: Normal caliber aorta without aneurysm, dissection, vasculitis or significant stenosis. Celiac: Patent. 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 This is concerning for the source of the perisplenic hematoma. SMA: Patent without evidence of aneurysm, dissection, vasculitis or significant stenosis. Renals: Both renal arteries are patent without evidence of aneurysm, dissection, vasculitis, fibromuscular dysplasia or significant stenosis. IMA: Patent without evidence of aneurysm, dissection, vasculitis or significant stenosis. Inflow: Patent without evidence of aneurysm, dissection, vasculitis or significant stenosis. Veins: No obvious venous abnormality within the limitations of this arterial phase study. Review of the MIP images confirms the above findings. NON-VASCULAR Hepatobiliary: No focal hepatic abnormality. Gallbladder unremarkable. Pancreas: Calcifications throughout the pancreas compatible with chronic pancreatitis. No evidence of acute pancreatitis  or ductal dilatation. Spleen: Large perisplenic hematoma again noted, measuring approximately 13.5 x 6.2 cm. With measured in the same planes and at the same level on prior study, this measured approximately 13 x 4.4 cm. This is felt to be slightly larger than prior study. Adrenals/Urinary Tract: No adrenal abnormality. No focal renal abnormality. No stones or hydronephrosis. Urinary bladder is unremarkable. Stomach/Bowel: Normal appendix. Stomach, large and small bowel grossly unremarkable. Lymphatic: No adenopathy Reproductive: No visible focal abnormality. Other: No free fluid or free air. Musculoskeletal: No acute bony abnormality. Review of the MIP images confirms the above findings. IMPRESSION: 1.5 cm rounded enhancing hypervascular area adjacent to the splenic artery in the region of the splenic hilum adjacent to the pancreatic tail. 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 Large perisplenic hematoma, 13.5 x 6.2 cm, slightly increased in size since prior study. These results were called by telephone at the time of interpretation on 04/20/2023 at 4:33 pm to provider [facility] , who verbally acknowledged these results. Electronically Signed   By: Charlett Nose M.D.   On: 04/20/2023 16:34   CT Angio Chest/Abd/Pel for Dissection W and/or Wo Contrast  Result Date: 04/07/2023 CLINICAL DATA:  Severe left chest pain radiating to the neck EXAM: CT ANGIOGRAPHY CHEST, ABDOMEN AND PELVIS TECHNIQUE: Non-contrast CT of the chest was initially obtained. Multidetector CT imaging through the chest, abdomen and pelvis was performed using the standard protocol during bolus administration of intravenous contrast. Multiplanar reconstructed images and MIPs were obtained and reviewed to evaluate the vascular anatomy. RADIATION DOSE REDUCTION: This exam was performed according to the departmental dose-optimization program which includes automated exposure control, adjustment of the mA and/or kV according to  patient size and/or use of iterative reconstruction technique. CONTRAST:  OMNIPAQUE IOHEXOL 350 MG/ML SOLN COMPARISON:  Chest CT 03/17/2023, 04/07/2023 radiograph, CT 12/03/2022 FINDINGS: CTA CHEST FINDINGS Cardiovascular: Non contrasted images of the chest demonstrate no acute intramural hematoma. Negative for aneurysm or acute aortic dissection. Normal cardiac size. No pericardial effusion Mediastinum/Nodes: Midline trachea. No thyroid mass. Subcentimeter mediastinal lymph nodes. Esophagus within normal limits. Lungs/Pleura: Small left-sided pleural effusion with adjacent atelectasis at the left base. Negative for pneumothorax. Right  apical scarring with calcification. Musculoskeletal: No acute or suspicious osseous abnormality. Review of the MIP images confirms the above findings. CTA ABDOMEN AND PELVIS FINDINGS VASCULAR Aorta: Normal caliber aorta without aneurysm, dissection, vasculitis or significant stenosis. Celiac: Patent without evidence of aneurysm, dissection, vasculitis or significant stenosis. SMA: Patent without evidence of aneurysm, dissection, vasculitis or significant stenosis. Renals: 2 right and 2 left renal arteries are patent without evidence of aneurysm, dissection, vasculitis, fibromuscular dysplasia or significant stenosis. IMA: Patent without evidence of aneurysm, dissection, vasculitis or significant stenosis. Inflow: Patent without evidence of aneurysm, dissection, vasculitis or significant stenosis. Veins: No obvious venous abnormality within the limitations of this arterial phase study. Review of the MIP images confirms the above findings. NON-VASCULAR Hepatobiliary: No focal liver abnormality is seen. No gallstones, gallbladder wall thickening, or biliary dilatation. Pancreas: Mild pancreatic head calcifications felt consistent with chronic pancreatitis. Minimal stranding adjacent to the pancreatic tail likely recent or mild pancreatitis. Fluid collection at the pancreatic tail,  extending towards the splenic hilus, this measures 4 by 2.6 cm. Spleen: Interim development large Peri splenic fluid collection. Subcapsular slightly dense splenic hematoma measuring up to 2.9 cm in thickness. No extravasation to suggest active bleeding at this time. Adrenals/Urinary Tract: Adrenal glands are within normal limits. Kidneys show no hydronephrosis. The bladder is unremarkable Stomach/Bowel: The stomach is nonenlarged. There is no dilated small bowel. Negative appendix. No acute bowel wall thickening Lymphatic: No suspicious lymph node Reproductive: Negative prostate Other: No free air. Rim enhancing fluid collections in the left upper quadrant, for example 4.6 by 2.3 cm collection on series 302, image 110. This is contiguous with fluid extending along the gastric fundus, this collection measures 7 by 2.6 cm on coronal series 601, image 111. Multiple additional smaller fluid collections. Musculoskeletal: No acute or suspicious osseous abnormality. Review of the MIP images confirms the above findings. IMPRESSION: 1. Negative for acute aortic dissection or aneurysm. No significant vascular disease within the abdomen or pelvis 2. Interim development of subcapsular splenic hematoma measuring up to 2.9 cm in thickness. New large perisplenic slightly dense fluid collection suggestive of contained rupture. No extravasation of contrast to suggest active bleeding on this exam. 3. Findings suspicious for mild acute on chronic distal pancreatitis. Multiple rim enhancing fluid collections in the left upper quadrant, largest contiguous with fluid extending along the gastric fundus, measuring up to 7 cm. These probably represent pseudocyst given findings of mild pancreatic inflammatory change and additional fluid collection at the tail of pancreas. 4. Small left-sided pleural effusion with adjacent atelectasis at the left base. Electronically Signed   By: Jasmine Pang M.D.   On: 04/07/2023 16:19   DG Chest  Portable 1 View  Result Date: 04/07/2023 CLINICAL DATA:  Chest pain and shortness of breath. EXAM: PORTABLE CHEST 1 VIEW COMPARISON:  Chest x-ray 03/17/2023.  Chest CT 03/17/2023. FINDINGS: There are findings suspicious for tiny right apical pneumothorax. Biapical calcifications are again noted. Left pleural effusion has resolved. Left basilar atelectasis persists. There is no mediastinal shift. The cardiomediastinal silhouette is within limits. No acute fractures are seen. Questionable IMPRESSION: 1. Findings suspicious for tiny right apical pneumothorax. 2. Left pleural effusion has resolved. Left basilar atelectasis persists. These results were called by telephone at the time of interpretation on 04/07/2023 at 3:17 pm to provider Dr. Gayleen Orem, who verbally acknowledged these results. Electronically Signed   By: Darliss Cheney M.D.   On: 04/07/2023 15:18    Micro Results    No results found for  this or any previous visit (from the past 240 hour(s)).  Today   Subjective    Colin Rhein today has no headache,no chest abdominal pain,no new weakness tingling or numbness,   Objective   Blood pressure 101/66, pulse 64, temperature 97.7 F (36.5 C), temperature source Oral, resp. rate 10, height 6' (1.829 m), weight 97 kg, SpO2 96%.   Intake/Output Summary (Last 24 hours) at 04/23/2023 1029 Last data filed at 04/23/2023 0435 Gross per 24 hour  Intake 200 ml  Output 85 ml  Net 115 ml    Exam  Awake Alert, No new F.N deficits,    [place].AT,PERRAL Supple Neck,   Symmetrical Chest wall movement, Good air movement bilaterally, CTAB RRR,No Gallops,   +ve B.Sounds, Abd Soft, mild postop tenderness, JP drain in place No Cyanosis, Clubbing or edema    Data Review   Recent Labs  Lab 04/21/23 1725 04/21/23 2252 04/22/23 0612 04/23/23 0432 04/23/23 0830  WBC 28.5* 25.4* 19.0* 15.3* 15.7*  HGB 9.8* 8.5* 8.7* 7.0* 7.9*  HCT 29.3* 25.3* 26.1* 20.9* 23.0*  PLT 354 328 348 322 349  MCV 88.8  87.5 87.9 88.2 89.8  MCH 29.7 29.4 29.3 29.5 30.9  MCHC 33.4 33.6 33.3 33.5 34.3  RDW 14.1 14.0 14.0 13.8 13.6  LYMPHSABS  --   --  1.8 3.0  --   MONOABS  --   --  1.0 1.6*  --   EOSABS  --   --  0.0 0.2  --   BASOSABS  --   --  0.0 0.1  --     Recent Labs  Lab 04/20/23 1245 04/21/23 0122 04/21/23 0354 04/21/23 0402 04/21/23 1338 04/22/23 0612 04/23/23 0432  NA 132*  --   --  132* 131* 135 132*  K 3.5  --   --  3.6 4.2 4.5 4.1  CL 97*  --   --  99  --  99 97*  CO2 23  --   --  25  --  26 27  ANIONGAP 12  --   --  8  --  10 8  GLUCOSE 249*  --   --  134*  --  129* 117*  BUN 14  --   --  11  --  9 9  CREATININE 0.72  --   --  0.60*  --  0.68 0.54*  AST 36  --   --  18  --   --   --   ALT 99*  --   --  73*  --   --   --   ALKPHOS 117  --   --  98  --   --   --   BILITOT 0.5  --   --  0.7  --   --   --   ALBUMIN 3.2*  --   --  2.7*  --   --   --   CRP  --   --   --   --   --  12.1* 6.2*  PROCALCITON  --   --   --   --   --  <0.10 <0.10  LATICACIDVEN  --   --  0.7  --   --   --   --   INR  --  1.1  --   --   --   --   --   HGBA1C  --   --  7.0*  --   --   --   --  BNP  --   --   --   --   --  38.2 46.1  MG  --   --   --  1.9  --  1.8 1.7  CALCIUM 8.8*  --   --  8.8*  --  8.9 8.5*    Total Time in preparing paper work, data evaluation and todays exam - 35 minutes  Signature  -    Susa Raring M.D on 04/23/2023 at 10:29 AM   -  To page go to [email or web address]

## 2023-04-23 NOTE — Progress Notes (Signed)
Rolling walker was delivered to room but pt declined it saying he'll use the one at home that belongs to his mother. Nurse informed pt that he could use this one of his own but pt adamant he did not want it. Rolling walker taken back by [employer] per pt's request.

## 2023-04-24 LAB — TYPE AND SCREEN
ABO/RH(D): O POS
Antibody Screen: NEGATIVE
Unit division: 0
Unit division: 0
Unit division: 0
Unit division: 0

## 2023-04-24 LAB — BPAM RBC
Blood Product Expiration Date: 202411022359
Blood Product Expiration Date: 202411022359
Blood Product Expiration Date: 202411052359
Blood Product Expiration Date: 202411052359
ISSUE DATE / TIME: 202410091247
ISSUE DATE / TIME: 202410091247
Unit Type and Rh: 5100
Unit Type and Rh: 5100
Unit Type and Rh: 5100
Unit Type and Rh: 5100

## 2023-05-19 ENCOUNTER — Encounter (HOSPITAL_COMMUNITY): Payer: Self-pay

## 2023-05-19 ENCOUNTER — Emergency Department (HOSPITAL_COMMUNITY): Payer: 59

## 2023-05-19 ENCOUNTER — Inpatient Hospital Stay (HOSPITAL_COMMUNITY)
Admission: EM | Admit: 2023-05-19 | Discharge: 2023-05-23 | DRG: 919 | Disposition: A | Discharge status: Home / Self Care | Payer: 59 | Attending: Internal Medicine | Admitting: Internal Medicine

## 2023-05-19 DIAGNOSIS — R7401 Elevation of levels of liver transaminase levels: Secondary | ICD-10-CM | POA: Diagnosis present

## 2023-05-19 DIAGNOSIS — S301XXA Contusion of abdominal wall, initial encounter: Secondary | ICD-10-CM

## 2023-05-19 DIAGNOSIS — J439 Emphysema, unspecified: Secondary | ICD-10-CM | POA: Insufficient documentation

## 2023-05-19 DIAGNOSIS — E871 Hypo-osmolality and hyponatremia: Secondary | ICD-10-CM | POA: Insufficient documentation

## 2023-05-19 DIAGNOSIS — R6510 Systemic inflammatory response syndrome (SIRS) of non-infectious origin without acute organ dysfunction: Secondary | ICD-10-CM

## 2023-05-19 DIAGNOSIS — R000 Tachycardia, unspecified: Secondary | ICD-10-CM | POA: Diagnosis present

## 2023-05-19 DIAGNOSIS — K860 Alcohol-induced chronic pancreatitis: Secondary | ICD-10-CM | POA: Diagnosis present

## 2023-05-19 DIAGNOSIS — D7831 Postprocedural hematoma of the spleen following a procedure on the spleen: Principal | ICD-10-CM | POA: Diagnosis present

## 2023-05-19 DIAGNOSIS — G894 Chronic pain syndrome: Secondary | ICD-10-CM | POA: Insufficient documentation

## 2023-05-19 DIAGNOSIS — F1010 Alcohol abuse, uncomplicated: Secondary | ICD-10-CM | POA: Diagnosis present

## 2023-05-19 DIAGNOSIS — Z8719 Personal history of other diseases of the digestive system: Secondary | ICD-10-CM

## 2023-05-19 DIAGNOSIS — D62 Acute posthemorrhagic anemia: Secondary | ICD-10-CM | POA: Diagnosis present

## 2023-05-19 DIAGNOSIS — K863 Pseudocyst of pancreas: Secondary | ICD-10-CM | POA: Diagnosis present

## 2023-05-19 DIAGNOSIS — D735 Infarction of spleen: Secondary | ICD-10-CM | POA: Diagnosis present

## 2023-05-19 DIAGNOSIS — D649 Anemia, unspecified: Secondary | ICD-10-CM | POA: Insufficient documentation

## 2023-05-19 DIAGNOSIS — Z9081 Acquired absence of spleen: Secondary | ICD-10-CM

## 2023-05-19 DIAGNOSIS — Z886 Allergy status to analgesic agent status: Secondary | ICD-10-CM

## 2023-05-19 DIAGNOSIS — G8918 Other acute postprocedural pain: Principal | ICD-10-CM | POA: Diagnosis present

## 2023-05-19 DIAGNOSIS — I10 Essential (primary) hypertension: Secondary | ICD-10-CM | POA: Diagnosis present

## 2023-05-19 DIAGNOSIS — Z789 Other specified health status: Secondary | ICD-10-CM | POA: Insufficient documentation

## 2023-05-19 DIAGNOSIS — D72829 Elevated white blood cell count, unspecified: Secondary | ICD-10-CM | POA: Diagnosis present

## 2023-05-19 DIAGNOSIS — E119 Type 2 diabetes mellitus without complications: Secondary | ICD-10-CM | POA: Diagnosis present

## 2023-05-19 DIAGNOSIS — Y838 Other surgical procedures as the cause of abnormal reaction of the patient, or of later complication, without mention of misadventure at the time of the procedure: Secondary | ICD-10-CM | POA: Diagnosis present

## 2023-05-19 DIAGNOSIS — F1090 Alcohol use, unspecified, uncomplicated: Secondary | ICD-10-CM | POA: Insufficient documentation

## 2023-05-19 DIAGNOSIS — Z79899 Other long term (current) drug therapy: Secondary | ICD-10-CM

## 2023-05-19 DIAGNOSIS — Z880 Allergy status to penicillin: Secondary | ICD-10-CM

## 2023-05-19 DIAGNOSIS — E785 Hyperlipidemia, unspecified: Secondary | ICD-10-CM | POA: Diagnosis present

## 2023-05-19 DIAGNOSIS — R109 Unspecified abdominal pain: Secondary | ICD-10-CM

## 2023-05-19 DIAGNOSIS — Z823 Family history of stroke: Secondary | ICD-10-CM

## 2023-05-19 DIAGNOSIS — F411 Generalized anxiety disorder: Secondary | ICD-10-CM | POA: Insufficient documentation

## 2023-05-19 DIAGNOSIS — Z7984 Long term (current) use of oral hypoglycemic drugs: Secondary | ICD-10-CM

## 2023-05-19 DIAGNOSIS — F17210 Nicotine dependence, cigarettes, uncomplicated: Secondary | ICD-10-CM | POA: Diagnosis present

## 2023-05-19 DIAGNOSIS — E1169 Type 2 diabetes mellitus with other specified complication: Secondary | ICD-10-CM

## 2023-05-19 DIAGNOSIS — K661 Hemoperitoneum: Secondary | ICD-10-CM | POA: Diagnosis present

## 2023-05-19 DIAGNOSIS — E7800 Pure hypercholesterolemia, unspecified: Secondary | ICD-10-CM | POA: Diagnosis present

## 2023-05-19 LAB — I-STAT CHEM 8, ED
BUN: 18 mg/dL (ref 6–20)
Calcium, Ion: 1.13 mmol/L — ABNORMAL LOW (ref 1.15–1.40)
Chloride: 96 mmol/L — ABNORMAL LOW (ref 98–111)
Creatinine, Ser: 0.7 mg/dL (ref 0.61–1.24)
Glucose, Bld: 183 mg/dL — ABNORMAL HIGH (ref 70–99)
HCT: 34 % — ABNORMAL LOW (ref 39.0–52.0)
Hemoglobin: 11.6 g/dL — ABNORMAL LOW (ref 13.0–17.0)
Potassium: 3.9 mmol/L (ref 3.5–5.1)
Sodium: 128 mmol/L — ABNORMAL LOW (ref 135–145)
TCO2: 22 mmol/L (ref 22–32)

## 2023-05-19 LAB — PROTIME-INR
INR: 1 (ref 0.8–1.2)
Prothrombin Time: 13.8 s (ref 11.4–15.2)

## 2023-05-19 MED ORDER — FENTANYL CITRATE PF 50 MCG/ML IJ SOSY
50.0000 ug | PREFILLED_SYRINGE | Freq: Once | INTRAMUSCULAR | Status: AC
  Administered 2023-05-19: 50 ug via INTRAVENOUS
  Filled 2023-05-19: qty 1

## 2023-05-19 MED ORDER — ONDANSETRON HCL 4 MG/2ML IJ SOLN
4.0000 mg | Freq: Once | INTRAMUSCULAR | Status: AC
  Administered 2023-05-19: 4 mg via INTRAVENOUS
  Filled 2023-05-19: qty 2

## 2023-05-19 NOTE — ED Provider Notes (Signed)
[place] EMERGENCY DEPARTMENT AT [facility] Provider Note   CSN: [id number] Arrival date & time: 05/19/23  2253     History {Add pertinent medical, surgical, social history, OB history to HPI:1} Chief Complaint  Patient presents with   Abdominal Pain    Samuel Abbott is a 62 y.o. male.  The history is provided by the patient and medical records.  Abdominal Pain Samuel Abbott is a 62 y.o. 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  Significantly worsened tonight. Cold sweats. Intermittent fever for one week up to 100.  Has sob started tonight.  No cough. No vomiting.  Has nausea. No leg swelling.        Home Medications Prior to Admission medications   Medication Sig Start Date End Date Taking? Authorizing Provider  acetaminophen (TYLENOL) 500 MG tablet Take 2 tablets (1,000 mg total) by mouth every 6 (six) hours as needed for mild pain, moderate pain, fever or headache. 04/23/23  Yes Barnetta Chapel, PA-C  losartan (COZAAR) 25 MG tablet Take 25 mg by mouth daily. 09/16/20  Yes [provider]  metFORMIN (GLUCOPHAGE) 500 MG tablet Take 1 tablet (500 mg total) by mouth daily with breakfast. Patient taking differently: Take 250 mg by mouth 2 (two) times daily. 01/24/21 06/19/23 Yes Maness, Loistine Chance, MD  methocarbamol (ROBAXIN) 500 MG tablet Take 1 tablet (500 mg total) by mouth every 8 (eight) hours as needed for muscle spasms. 04/23/23  Yes Barnetta Chapel, PA-C  tiZANidine (ZANAFLEX) 4 MG tablet Take 4 mg by mouth every 8 (eight) hours as needed. 05/10/23  Yes [provider]  DULoxetine (CYMBALTA) 20 MG capsule Take 20 mg by mouth daily. 01/13/21   [provider]  levofloxacin (LEVAQUIN) 750 MG tablet Take 1 tablet (750 mg total) by mouth daily. 04/24/23   Leroy Sea, MD  Multiple Vitamin (MULTIVITAMIN) tablet Take 1 tablet by mouth daily.     [provider]  ondansetron (ZOFRAN) 4 MG tablet Take 1 tablet (4 mg total) by mouth every 8 (eight) hours as needed for nausea or vomiting. 04/23/23   Leroy Sea, MD  Oxycodone HCl 10 MG TABS Take 1 tablet (10 mg total) by mouth every 6 (six) hours as needed for moderate pain. 04/23/23   Barnetta Chapel, PA-C  polyethylene glycol powder (GLYCOLAX/MIRALAX) 17 GM/SCOOP powder Take 17 g by mouth daily. 04/23/23   Leroy Sea, MD  senna-docusate (SENOKOT-S) 8.6-50 MG tablet Take 2 tablets by mouth at bedtime as needed for mild constipation. 04/23/23   Leroy Sea, MD      Allergies    Amoxil [amoxicillin], Penicillins, and Motrin [ibuprofen]    Review of Systems   Review of Systems  Gastrointestinal:  Positive for abdominal pain.  All other systems reviewed and are negative.   Physical Exam Updated Vital Signs BP (!) 137/106 (BP Location: Right Arm)   Temp (!) 96.4 F (35.8 C) (Axillary)   Resp (!) 21   SpO2 100%  Physical Exam Vitals and nursing note reviewed.  Constitutional:      General: He is in acute distress.     Appearance: He is well-developed.  HENT:     Head: Normocephalic and atraumatic.  Cardiovascular:     Rate and Rhythm: Normal rate and regular rhythm.     Heart sounds: No murmur heard. Pulmonary:     Effort: Pulmonary effort is normal. No  respiratory distress.     Breath sounds: Normal breath sounds.  Abdominal:     Palpations: Abdomen is soft.     Tenderness: There is abdominal tenderness. There is no guarding or rebound.     Comments: Generalized abdominal tednerness  Musculoskeletal:        General: No swelling or tenderness.  Skin:    General: Skin is warm and dry.  Neurological:     Mental Status: He is alert and oriented to person, place, and time.  Psychiatric:        Behavior: Behavior normal.     ED Results / Procedures / Treatments   Labs (all labs ordered are listed, but only abnormal results are  displayed) Labs Reviewed - No data to display  EKG None  Radiology No results found.  Procedures Procedures  {Document cardiac monitor, telemetry assessment procedure when appropriate:1}  Medications Ordered in ED Medications - No data to display  ED Course/ Medical Decision Making/ A&P   {   Click here for ABCD2, HEART and other calculatorsREFRESH Note before signing :1}                              Medical Decision Making  ***  {Document critical care time when appropriate:1} {Document review of labs and clinical decision tools ie heart score, Chads2Vasc2 etc:1}  {Document your independent review of radiology images, and any outside records:1} {Document your discussion with family members, caretakers, and with consultants:1} {Document social determinants of health affecting pt's care:1} {Document your decision making why or why not admission, treatments were needed:1} Final Clinical Impression(s) / ED Diagnoses Final diagnoses:  None    Rx / DC Orders ED Discharge Orders     None

## 2023-05-19 NOTE — ED Triage Notes (Signed)
Pt BIB [facility] from home with abdominal pain that woke him up from sleep. Pt reports he woke up in cold sweat with sharp pains radiating from his whole torso down to his groin. S/p splenectomy x3 weeks ago. Denies any urinary or bowel changes. Pt reports he was fine before he went to bed. Endorses nausea with dry heaves.  88/palp initial BP. Pt received 100 mL IV NS.   EMS Vitals: HR 68 O2 100% BP 146/105 CBG 230

## 2023-05-20 ENCOUNTER — Encounter (HOSPITAL_COMMUNITY): Payer: Self-pay | Admitting: Internal Medicine

## 2023-05-20 ENCOUNTER — Emergency Department (HOSPITAL_COMMUNITY): Payer: 59

## 2023-05-20 ENCOUNTER — Inpatient Hospital Stay (HOSPITAL_COMMUNITY): Payer: 59

## 2023-05-20 DIAGNOSIS — F411 Generalized anxiety disorder: Secondary | ICD-10-CM

## 2023-05-20 DIAGNOSIS — G894 Chronic pain syndrome: Secondary | ICD-10-CM

## 2023-05-20 DIAGNOSIS — Z8719 Personal history of other diseases of the digestive system: Secondary | ICD-10-CM

## 2023-05-20 DIAGNOSIS — E7800 Pure hypercholesterolemia, unspecified: Secondary | ICD-10-CM | POA: Diagnosis present

## 2023-05-20 DIAGNOSIS — Z789 Other specified health status: Secondary | ICD-10-CM

## 2023-05-20 DIAGNOSIS — D72829 Elevated white blood cell count, unspecified: Secondary | ICD-10-CM | POA: Diagnosis present

## 2023-05-20 DIAGNOSIS — D62 Acute posthemorrhagic anemia: Secondary | ICD-10-CM | POA: Diagnosis present

## 2023-05-20 DIAGNOSIS — J439 Emphysema, unspecified: Secondary | ICD-10-CM | POA: Insufficient documentation

## 2023-05-20 DIAGNOSIS — G8918 Other acute postprocedural pain: Secondary | ICD-10-CM | POA: Diagnosis present

## 2023-05-20 DIAGNOSIS — Z9081 Acquired absence of spleen: Secondary | ICD-10-CM

## 2023-05-20 DIAGNOSIS — I10 Essential (primary) hypertension: Secondary | ICD-10-CM

## 2023-05-20 DIAGNOSIS — Z79899 Other long term (current) drug therapy: Secondary | ICD-10-CM | POA: Diagnosis not present

## 2023-05-20 DIAGNOSIS — D735 Infarction of spleen: Secondary | ICD-10-CM | POA: Diagnosis present

## 2023-05-20 DIAGNOSIS — E119 Type 2 diabetes mellitus without complications: Secondary | ICD-10-CM | POA: Diagnosis present

## 2023-05-20 DIAGNOSIS — Y838 Other surgical procedures as the cause of abnormal reaction of the patient, or of later complication, without mention of misadventure at the time of the procedure: Secondary | ICD-10-CM | POA: Diagnosis present

## 2023-05-20 DIAGNOSIS — D649 Anemia, unspecified: Secondary | ICD-10-CM

## 2023-05-20 DIAGNOSIS — R6510 Systemic inflammatory response syndrome (SIRS) of non-infectious origin without acute organ dysfunction: Secondary | ICD-10-CM

## 2023-05-20 DIAGNOSIS — R000 Tachycardia, unspecified: Secondary | ICD-10-CM | POA: Diagnosis present

## 2023-05-20 DIAGNOSIS — K863 Pseudocyst of pancreas: Secondary | ICD-10-CM | POA: Diagnosis present

## 2023-05-20 DIAGNOSIS — Z823 Family history of stroke: Secondary | ICD-10-CM | POA: Diagnosis not present

## 2023-05-20 DIAGNOSIS — F17210 Nicotine dependence, cigarettes, uncomplicated: Secondary | ICD-10-CM | POA: Diagnosis present

## 2023-05-20 DIAGNOSIS — R1012 Left upper quadrant pain: Secondary | ICD-10-CM

## 2023-05-20 DIAGNOSIS — K860 Alcohol-induced chronic pancreatitis: Secondary | ICD-10-CM | POA: Diagnosis present

## 2023-05-20 DIAGNOSIS — R109 Unspecified abdominal pain: Secondary | ICD-10-CM

## 2023-05-20 DIAGNOSIS — E871 Hypo-osmolality and hyponatremia: Secondary | ICD-10-CM | POA: Insufficient documentation

## 2023-05-20 DIAGNOSIS — Z886 Allergy status to analgesic agent status: Secondary | ICD-10-CM | POA: Diagnosis not present

## 2023-05-20 DIAGNOSIS — K661 Hemoperitoneum: Secondary | ICD-10-CM | POA: Diagnosis present

## 2023-05-20 DIAGNOSIS — D7831 Postprocedural hematoma of the spleen following a procedure on the spleen: Secondary | ICD-10-CM | POA: Diagnosis present

## 2023-05-20 DIAGNOSIS — Z880 Allergy status to penicillin: Secondary | ICD-10-CM | POA: Diagnosis not present

## 2023-05-20 DIAGNOSIS — F1010 Alcohol abuse, uncomplicated: Secondary | ICD-10-CM | POA: Diagnosis present

## 2023-05-20 DIAGNOSIS — E785 Hyperlipidemia, unspecified: Secondary | ICD-10-CM

## 2023-05-20 DIAGNOSIS — S301XXA Contusion of abdominal wall, initial encounter: Secondary | ICD-10-CM | POA: Diagnosis not present

## 2023-05-20 LAB — CBC WITH DIFFERENTIAL/PLATELET
Abs Immature Granulocytes: 0.12 10*3/uL — ABNORMAL HIGH (ref 0.00–0.07)
Basophils Absolute: 0 10*3/uL (ref 0.0–0.1)
Basophils Relative: 0 %
Eosinophils Absolute: 0 10*3/uL (ref 0.0–0.5)
Eosinophils Relative: 0 %
HCT: 32.7 % — ABNORMAL LOW (ref 39.0–52.0)
Hemoglobin: 10.2 g/dL — ABNORMAL LOW (ref 13.0–17.0)
Immature Granulocytes: 1 %
Lymphocytes Relative: 12 %
Lymphs Abs: 2.5 10*3/uL (ref 0.7–4.0)
MCH: 27.9 pg (ref 26.0–34.0)
MCHC: 31.2 g/dL (ref 30.0–36.0)
MCV: 89.6 fL (ref 80.0–100.0)
Monocytes Absolute: 1.7 10*3/uL — ABNORMAL HIGH (ref 0.1–1.0)
Monocytes Relative: 8 %
Neutro Abs: 15.8 10*3/uL — ABNORMAL HIGH (ref 1.7–7.7)
Neutrophils Relative %: 79 %
Platelets: 455 10*3/uL — ABNORMAL HIGH (ref 150–400)
RBC: 3.65 MIL/uL — ABNORMAL LOW (ref 4.22–5.81)
RDW: 14.4 % (ref 11.5–15.5)
WBC: 20.1 10*3/uL — ABNORMAL HIGH (ref 4.0–10.5)
nRBC: 0 % (ref 0.0–0.2)

## 2023-05-20 LAB — COMPREHENSIVE METABOLIC PANEL
ALT: 32 U/L (ref 0–44)
AST: 42 U/L — ABNORMAL HIGH (ref 15–41)
Albumin: 2.9 g/dL — ABNORMAL LOW (ref 3.5–5.0)
Alkaline Phosphatase: 93 U/L (ref 38–126)
Anion gap: 12 (ref 5–15)
BUN: 18 mg/dL (ref 6–20)
CO2: 21 mmol/L — ABNORMAL LOW (ref 22–32)
Calcium: 8.9 mg/dL (ref 8.9–10.3)
Chloride: 96 mmol/L — ABNORMAL LOW (ref 98–111)
Creatinine, Ser: 0.74 mg/dL (ref 0.61–1.24)
GFR, Estimated: >60 mL/min (ref >60)
Glucose, Bld: 186 mg/dL — ABNORMAL HIGH (ref 70–99)
Potassium: 3.8 mmol/L (ref 3.5–5.1)
Sodium: 129 mmol/L — ABNORMAL LOW (ref 135–145)
Total Bilirubin: 0.8 mg/dL (ref <1.2)
Total Protein: 6.5 g/dL (ref 6.5–8.1)

## 2023-05-20 LAB — RETICULOCYTES
Immature Retic Fract: 13.3 % (ref 2.3–15.9)
RBC.: 3.92 MIL/uL — ABNORMAL LOW (ref 4.22–5.81)
Retic Count, Absolute: 64.3 10*3/uL (ref 19.0–186.0)
Retic Ct Pct: 1.6 % (ref 0.4–3.1)

## 2023-05-20 LAB — CBC
HCT: 34.9 % — ABNORMAL LOW (ref 39.0–52.0)
Hemoglobin: 10.9 g/dL — ABNORMAL LOW (ref 13.0–17.0)
MCH: 27.8 pg (ref 26.0–34.0)
MCHC: 31.2 g/dL (ref 30.0–36.0)
MCV: 89 fL (ref 80.0–100.0)
Platelets: 428 10*3/uL — ABNORMAL HIGH (ref 150–400)
RBC: 3.92 MIL/uL — ABNORMAL LOW (ref 4.22–5.81)
RDW: 14.5 % (ref 11.5–15.5)
WBC: 26 10*3/uL — ABNORMAL HIGH (ref 4.0–10.5)
nRBC: 0 % (ref 0.0–0.2)

## 2023-05-20 LAB — BASIC METABOLIC PANEL
Anion gap: 9 (ref 5–15)
BUN: 22 mg/dL — ABNORMAL HIGH (ref 6–20)
CO2: 22 mmol/L (ref 22–32)
Calcium: 8.6 mg/dL — ABNORMAL LOW (ref 8.9–10.3)
Chloride: 96 mmol/L — ABNORMAL LOW (ref 98–111)
Creatinine, Ser: 0.92 mg/dL (ref 0.61–1.24)
GFR, Estimated: >60 mL/min (ref >60)
Glucose, Bld: 154 mg/dL — ABNORMAL HIGH (ref 70–99)
Potassium: 4.3 mmol/L (ref 3.5–5.1)
Sodium: 127 mmol/L — ABNORMAL LOW (ref 135–145)

## 2023-05-20 LAB — TROPONIN I (HIGH SENSITIVITY)
Troponin I (High Sensitivity): 4 ng/L (ref <18)
Troponin I (High Sensitivity): 4 ng/L (ref <18)

## 2023-05-20 LAB — VITAMIN B12: Vitamin B-12: 483 pg/mL (ref 180–914)

## 2023-05-20 LAB — PREPARE RBC (CROSSMATCH)

## 2023-05-20 LAB — FOLATE
Folate: 15.1 ng/mL (ref >5.9)
Folate: 14.2 ng/mL (ref >5.9)

## 2023-05-20 LAB — IRON AND TIBC
Iron: 15 ug/dL — ABNORMAL LOW (ref 45–182)
Saturation Ratios: 6 % — ABNORMAL LOW (ref 17.9–39.5)
TIBC: 263 ug/dL (ref 250–450)
UIBC: 248 ug/dL

## 2023-05-20 LAB — OSMOLALITY, URINE: Osmolality, Ur: 551 mosm/kg (ref 300–900)

## 2023-05-20 LAB — LIPASE, BLOOD: Lipase: 120 U/L — ABNORMAL HIGH (ref 11–51)

## 2023-05-20 LAB — SODIUM, URINE, RANDOM: Sodium, Ur: <10 mmol/L

## 2023-05-20 LAB — FERRITIN: Ferritin: 503 ng/mL — ABNORMAL HIGH (ref 24–336)

## 2023-05-20 MED ORDER — FOLIC ACID 1 MG PO TABS
1.0000 mg | ORAL_TABLET | Freq: Every day | ORAL | Status: DC
Start: 2023-05-20 — End: 2023-05-23
  Administered 2023-05-21 – 2023-05-23 (×3): 1 mg via ORAL
  Filled 2023-05-20 (×3): qty 1

## 2023-05-20 MED ORDER — LORAZEPAM 2 MG/ML IJ SOLN: 1.0000 mg | INTRAMUSCULAR | Status: DC | PRN

## 2023-05-20 MED ORDER — LEVALBUTEROL HCL 0.63 MG/3ML IN NEBU: 0.6300 mg | INHALATION_SOLUTION | Freq: Four times a day (QID) | RESPIRATORY_TRACT | Status: DC | PRN

## 2023-05-20 MED ORDER — THIAMINE MONONITRATE 100 MG PO TABS
100.0000 mg | ORAL_TABLET | Freq: Every day | ORAL | Status: DC
  Administered 2023-05-21 – 2023-05-23 (×3): 100 mg via ORAL
  Filled 2023-05-20 (×3): qty 1

## 2023-05-20 MED ORDER — MIDAZOLAM HCL 2 MG/2ML IJ SOLN
INTRAMUSCULAR | Status: AC | PRN
  Administered 2023-05-20 (×4): 1 mg via INTRAVENOUS

## 2023-05-20 MED ORDER — DULOXETINE HCL 20 MG PO CPEP
20.0000 mg | ORAL_CAPSULE | Freq: Every day | ORAL | Status: DC
Start: 2023-05-20 — End: 2023-05-23
  Administered 2023-05-21 – 2023-05-23 (×3): 20 mg via ORAL
  Filled 2023-05-20 (×4): qty 1

## 2023-05-20 MED ORDER — THIAMINE HCL 100 MG/ML IJ SOLN: 100.0000 mg | Freq: Every day | INTRAMUSCULAR | Status: DC

## 2023-05-20 MED ORDER — KETAMINE HCL 50 MG/5ML IJ SOSY
0.3000 mg/kg | PREFILLED_SYRINGE | Freq: Once | INTRAMUSCULAR | Status: AC
  Administered 2023-05-20: 21 mg via INTRAVENOUS
  Filled 2023-05-20: qty 5

## 2023-05-20 MED ORDER — MIDAZOLAM HCL 2 MG/2ML IJ SOLN
INTRAMUSCULAR | Status: AC
  Filled 2023-05-20: qty 2

## 2023-05-20 MED ORDER — SODIUM CHLORIDE 0.9% FLUSH
3.0000 mL | INTRAVENOUS | Status: DC | PRN
Start: 2023-05-20 — End: 2023-05-23

## 2023-05-20 MED ORDER — FENTANYL CITRATE (PF) 100 MCG/2ML IJ SOLN
INTRAMUSCULAR | Status: AC | PRN
  Administered 2023-05-20 (×4): 25 ug via INTRAVENOUS

## 2023-05-20 MED ORDER — HYDROMORPHONE HCL 1 MG/ML IJ SOLN
1.5000 mg | INTRAMUSCULAR | Status: DC | PRN
  Administered 2023-05-20 – 2023-05-23 (×19): 1.5 mg via INTRAVENOUS
  Filled 2023-05-20 (×5): qty 1.5
  Filled 2023-05-20: qty 2
  Filled 2023-05-20 (×3): qty 1.5
  Filled 2023-05-20: qty 2
  Filled 2023-05-20: qty 1.5
  Filled 2023-05-20: qty 2
  Filled 2023-05-20 (×7): qty 1.5
  Filled 2023-05-20: qty 2

## 2023-05-20 MED ORDER — METHOCARBAMOL 500 MG PO TABS
500.0000 mg | ORAL_TABLET | Freq: Three times a day (TID) | ORAL | Status: DC | PRN
  Administered 2023-05-20 – 2023-05-23 (×4): 500 mg via ORAL
  Filled 2023-05-20 (×4): qty 1

## 2023-05-20 MED ORDER — LORAZEPAM 1 MG PO TABS: 1.0000 mg | ORAL_TABLET | ORAL | Status: DC | PRN

## 2023-05-20 MED ORDER — LOSARTAN POTASSIUM 50 MG PO TABS: 25.0000 mg | ORAL_TABLET | Freq: Every day | ORAL | Status: DC

## 2023-05-20 MED ORDER — FENTANYL CITRATE (PF) 100 MCG/2ML IJ SOLN
INTRAMUSCULAR | Status: AC
  Filled 2023-05-20: qty 2

## 2023-05-20 MED ORDER — HYDROMORPHONE HCL 1 MG/ML IJ SOLN
1.0000 mg | Freq: Once | INTRAMUSCULAR | Status: AC
  Administered 2023-05-20: 1 mg via INTRAVENOUS
  Filled 2023-05-20: qty 1

## 2023-05-20 MED ORDER — SODIUM CHLORIDE 0.9% IV SOLUTION: Freq: Once | INTRAVENOUS | Status: AC

## 2023-05-20 MED ORDER — ONDANSETRON HCL 4 MG/2ML IJ SOLN
4.0000 mg | Freq: Four times a day (QID) | INTRAMUSCULAR | Status: DC | PRN
  Administered 2023-05-20 – 2023-05-22 (×5): 4 mg via INTRAVENOUS
  Filled 2023-05-20 (×6): qty 2

## 2023-05-20 MED ORDER — IOHEXOL 350 MG/ML SOLN
100.0000 mL | Freq: Once | INTRAVENOUS | Status: AC | PRN
  Administered 2023-05-20: 100 mL via INTRAVENOUS

## 2023-05-20 MED ORDER — SODIUM CHLORIDE 0.9 % IV SOLN
INTRAVENOUS | Status: AC
Start: 2023-05-20 — End: 2023-05-21

## 2023-05-20 MED ORDER — PROCHLORPERAZINE EDISYLATE 10 MG/2ML IJ SOLN
10.0000 mg | Freq: Four times a day (QID) | INTRAMUSCULAR | Status: DC | PRN
  Administered 2023-05-20 – 2023-05-22 (×5): 10 mg via INTRAVENOUS
  Filled 2023-05-20 (×5): qty 2

## 2023-05-20 MED ORDER — SODIUM CHLORIDE 0.9 % IV BOLUS
500.0000 mL | Freq: Once | INTRAVENOUS | Status: AC
  Administered 2023-05-20: 500 mL via INTRAVENOUS

## 2023-05-20 MED ORDER — NICOTINE 21 MG/24HR TD PT24
21.0000 mg | MEDICATED_PATCH | Freq: Every day | TRANSDERMAL | Status: DC
  Administered 2023-05-20 – 2023-05-23 (×4): 21 mg via TRANSDERMAL
  Filled 2023-05-20 (×4): qty 1

## 2023-05-20 MED ORDER — HYDROMORPHONE HCL 1 MG/ML IJ SOLN
1.0000 mg | INTRAMUSCULAR | Status: DC | PRN
  Administered 2023-05-20: 1 mg via INTRAVENOUS
  Filled 2023-05-20: qty 1

## 2023-05-20 MED ORDER — ALPRAZOLAM 0.25 MG PO TABS
0.2500 mg | ORAL_TABLET | Freq: Two times a day (BID) | ORAL | Status: DC | PRN
  Administered 2023-05-23: 0.25 mg via ORAL
  Filled 2023-05-20: qty 1

## 2023-05-20 MED ORDER — METRONIDAZOLE 500 MG/100ML IV SOLN
500.0000 mg | Freq: Two times a day (BID) | INTRAVENOUS | Status: DC
  Administered 2023-05-20 – 2023-05-23 (×7): 500 mg via INTRAVENOUS
  Filled 2023-05-20 (×7): qty 100

## 2023-05-20 MED ORDER — ACETAMINOPHEN 650 MG RE SUPP: 650.0000 mg | Freq: Four times a day (QID) | RECTAL | Status: DC | PRN

## 2023-05-20 MED ORDER — ACETAMINOPHEN 325 MG PO TABS
650.0000 mg | ORAL_TABLET | Freq: Four times a day (QID) | ORAL | Status: DC | PRN
  Administered 2023-05-23 (×2): 650 mg via ORAL
  Filled 2023-05-20 (×2): qty 2

## 2023-05-20 MED ORDER — ONDANSETRON HCL 4 MG PO TABS
4.0000 mg | ORAL_TABLET | Freq: Four times a day (QID) | ORAL | Status: DC | PRN
  Administered 2023-05-20 – 2023-05-22 (×3): 4 mg via ORAL
  Filled 2023-05-20 (×4): qty 1

## 2023-05-20 MED ORDER — ADULT MULTIVITAMIN W/MINERALS CH
1.0000 | ORAL_TABLET | Freq: Every day | ORAL | Status: DC
  Administered 2023-05-21 – 2023-05-23 (×3): 1 via ORAL
  Filled 2023-05-20 (×3): qty 1

## 2023-05-20 MED ORDER — HYDRALAZINE HCL 20 MG/ML IJ SOLN: 5.0000 mg | Freq: Three times a day (TID) | INTRAMUSCULAR | Status: DC | PRN

## 2023-05-20 MED ORDER — SODIUM CHLORIDE 0.9% FLUSH
3.0000 mL | Freq: Two times a day (BID) | INTRAVENOUS | Status: DC
  Administered 2023-05-21 – 2023-05-23 (×5): 3 mL via INTRAVENOUS

## 2023-05-20 MED ORDER — LORAZEPAM 2 MG/ML IJ SOLN: 1.0000 mg | Freq: Once | INTRAMUSCULAR | Status: DC | PRN

## 2023-05-20 MED ORDER — HYDROMORPHONE HCL 1 MG/ML IJ SOLN
1.0000 mg | INTRAMUSCULAR | Status: DC | PRN
  Administered 2023-05-20 (×2): 1 mg via INTRAVENOUS
  Filled 2023-05-20 (×2): qty 1

## 2023-05-20 MED ORDER — LACTATED RINGERS IV SOLN: INTRAVENOUS | Status: DC

## 2023-05-20 MED ORDER — OXYCODONE HCL 5 MG PO TABS
5.0000 mg | ORAL_TABLET | ORAL | Status: DC | PRN
  Administered 2023-05-20 – 2023-05-23 (×4): 5 mg via ORAL
  Filled 2023-05-20 (×4): qty 1

## 2023-05-20 MED ORDER — CIPROFLOXACIN IN D5W 400 MG/200ML IV SOLN
400.0000 mg | Freq: Two times a day (BID) | INTRAVENOUS | Status: DC
  Administered 2023-05-20 – 2023-05-23 (×7): 400 mg via INTRAVENOUS
  Filled 2023-05-20 (×7): qty 200

## 2023-05-20 MED ORDER — FENOFIBRATE 54 MG PO TABS: 54.0000 mg | ORAL_TABLET | Freq: Every day | ORAL | Status: DC

## 2023-05-20 MED ORDER — SODIUM CHLORIDE 0.9 % IV SOLN: 250.0000 mL | INTRAVENOUS | Status: AC | PRN

## 2023-05-20 NOTE — ED Notes (Signed)
ED TO INPATIENT HANDOFF REPORT  ED Nurse Name and Phone #: Delice Bison, RN  S Name/Age/Gender Samuel Abbott 62 y.o. male Room/Bed: 040C/040C  Code Status   Code Status: Full Code  Home/SNF/Other Home Patient oriented to: self, place, time, and situation Is this baseline? Yes   Triage Complete: Triage complete  Chief Complaint Abdominal hematoma [S30.1XXA]  Triage Note Pt BIB [facility] from home with abdominal pain that woke him up from sleep. Pt reports he woke up in cold sweat with sharp pains radiating from his whole torso down to his groin. S/p splenectomy x3 weeks ago. Denies any urinary or bowel changes. Pt reports he was fine before he went to bed. Endorses nausea with dry heaves.  88/palp initial BP. Pt received 100 mL IV NS.   EMS Vitals: HR 68 O2 100% BP 146/105 CBG 230   Allergies Allergies  Allergen Reactions   Amoxil [Amoxicillin] Anaphylaxis   Penicillins Anaphylaxis   Motrin [Ibuprofen] Other (See Comments)    Stomach pain after several uses of 800mg  tablets.    Level of Care/Admitting Diagnosis ED Disposition     ED Disposition  Admit   Condition  --   Comment  Hospital Area: [facility] [[id number]]  Level of Care: Progressive [102]  Admit to Progressive based on following criteria: Other see comments  Comments: Severe abdominal pain.  May admit patient to [facility] or [facility] if equivalent level of care is available:: No  Covid Evaluation: Asymptomatic - no recent exposure (last 10 days) testing not required  Diagnosis: Abdominal hematoma [[id number]]  Admitting Physician: Tereasa Coop [[id number]]  Attending Physician: Tereasa Coop [[id number]]  Certification:: I certify this patient will need inpatient services for at least 2 midnights  Expected Medical Readiness: 05/24/2023          B Medical/Surgery History Past Medical History:  Diagnosis Date   Acute pancreatitis    Alcohol abuse    Diabetes mellitus without  complication (HCC)    Eczema    High cholesterol    Hypertension    Tobacco abuse    Past Surgical History:  Procedure Laterality Date   SPLENECTOMY, TOTAL N/A 04/21/2023   Procedure: OPEN SPLENECTOMY;  Surgeon: [name], MD;  Location: [facility] OR;  Service: General;  Laterality: N/A;   WRIST SURGERY       A IV Location/Drains/Wounds Patient Lines/Drains/Airways Status     Active Line/Drains/Airways     Name Placement date Placement time Site Days   Peripheral IV 05/19/23 20 G Left Antecubital 05/19/23  --  Antecubital  1   Closed System Drain 1 Anterior;Lateral LUQ Bulb (JP) 19 Fr. 04/21/23  1416  LUQ  29   Closed System Drain 1 Left Abdomen 12 Fr. 05/20/23  1500  Abdomen  less than 1            Intake/Output Last 24 hours  Intake/Output Summary (Last 24 hours) at 05/20/2023 1621 Last data filed at 05/20/2023 2956 Gross per 24 hour  Intake 796.63 ml  Output --  Net 796.63 ml    Labs/Imaging Results for orders placed or performed during the hospital encounter of 05/19/23 (from the past 48 hour(s))  Comprehensive metabolic panel     Status: Abnormal   Collection Time: 05/19/23 11:38 PM  Result Value Ref Range   Sodium 129 (L) 135 - 145 mmol/L   Potassium 3.8 3.5 - 5.1 mmol/L   Chloride 96 (L) 98 - 111 mmol/L   CO2 21 (L)  22 - 32 mmol/L   Glucose, Bld 186 (H) 70 - 99 mg/dL    Comment: Glucose reference range applies only to samples taken after fasting for at least 8 hours.   BUN 18 6 - 20 mg/dL   Creatinine, Ser 1.61 0.61 - 1.24 mg/dL   Calcium 8.9 8.9 - 09.6 mg/dL   Total Protein 6.5 6.5 - 8.1 g/dL   Albumin 2.9 (L) 3.5 - 5.0 g/dL   AST 42 (H) 15 - 41 U/L   ALT 32 0 - 44 U/L   Alkaline Phosphatase 93 38 - 126 U/L   Total Bilirubin 0.8 <1.2 mg/dL   GFR, Estimated >04 >54 mL/min    Comment: (NOTE) Calculated using the CKD-EPI Creatinine Equation (2021)    Anion gap 12 5 - 15    Comment: Performed at [facility], [address] [place], [place]  [address]  Lipase, blood     Status: Abnormal   Collection Time: 05/19/23 11:38 PM  Result Value Ref Range   Lipase 120 (H) 11 - 51 U/L    Comment: Performed at [facility], [address] [place], [place] [address]  CBC with Differential     Status: Abnormal   Collection Time: 05/19/23 11:38 PM  Result Value Ref Range   WBC 20.1 (H) 4.0 - 10.5 K/uL   RBC 3.65 (L) 4.22 - 5.81 MIL/uL   Hemoglobin 10.2 (L) 13.0 - 17.0 g/dL   HCT 29.5 (L) 62.1 - 30.8 %   MCV 89.6 80.0 - 100.0 fL   MCH 27.9 26.0 - 34.0 pg   MCHC 31.2 30.0 - 36.0 g/dL   RDW 65.7 84.6 - 96.2 %   Platelets 455 (H) 150 - 400 K/uL   nRBC 0.0 0.0 - 0.2 %   Neutrophils Relative % 79 %   Neutro Abs 15.8 (H) 1.7 - 7.7 K/uL   Lymphocytes Relative 12 %   Lymphs Abs 2.5 0.7 - 4.0 K/uL   Monocytes Relative 8 %   Monocytes Absolute 1.7 (H) 0.1 - 1.0 K/uL   Eosinophils Relative 0 %   Eosinophils Absolute 0.0 0.0 - 0.5 K/uL   Basophils Relative 0 %   Basophils Absolute 0.0 0.0 - 0.1 K/uL   Immature Granulocytes 1 %   Abs Immature Granulocytes 0.12 (H) 0.00 - 0.07 K/uL    Comment: Performed at [facility], [address], [place], [place] [address]  Protime-INR     Status: None   Collection Time: 05/19/23 11:38 PM  Result Value Ref Range   Prothrombin Time 13.8 11.4 - 15.2 seconds   INR 1.0 0.8 - 1.2    Comment: (NOTE) INR goal varies based on device and disease states. Performed at [facility] Lab, [address], [place], [place] [address]   Troponin I (High Sensitivity)     Status: None   Collection Time: 05/19/23 11:38 PM  Result Value Ref Range   Troponin I (High Sensitivity) 4 <18 ng/L    Comment: (NOTE) Elevated high sensitivity troponin I (hsTnI) values and significant  changes across serial measurements may suggest ACS but many other  chronic and acute conditions are known to elevate hsTnI results.  Refer to the "Links" section for chest pain algorithms and additional  guidance. Performed at  [facility], [address] [place], [place] [address]   I-stat chem 8, ED     Status: Abnormal   Collection Time: 05/19/23 11:40 PM  Result Value Ref Range   Sodium 128 (L) 135 - 145 mmol/L   Potassium 3.9 3.5 - 5.1 mmol/L   Chloride 96 (L) 98 - 111 mmol/L   BUN 18 6 - 20 mg/dL   Creatinine, Ser 4.09 0.61 - 1.24 mg/dL   Glucose, Bld 811 (H) 70 - 99 mg/dL    Comment: Glucose reference range applies only to samples taken after fasting for at least 8 hours.   Calcium, Ion 1.13 (L) 1.15 - 1.40 mmol/L   TCO2 22 22 - 32 mmol/L   Hemoglobin 11.6 (L) 13.0 - 17.0 g/dL   HCT 91.4 (L) 78.2 - 95.6 %  Troponin I (High Sensitivity)     Status: None   Collection Time: 05/20/23  1:33 AM  Result Value Ref Range   Troponin I (High Sensitivity) 4 <18 ng/L    Comment: (NOTE) Elevated high sensitivity troponin I (hsTnI) values and significant  changes across serial measurements may suggest ACS but many other  chronic and acute conditions are known to elevate hsTnI results.  Refer to the "Links" section for chest pain algorithms and additional  guidance. Performed at [facility], [address], [place], [place] [address]   Type and screen [facility]     Status: None (Preliminary result)   Collection Time: 05/20/23  7:00 AM  Result Value Ref Range   ABO/RH(D) O POS    Antibody Screen NEG    Sample Expiration      05/23/2023,2359 Performed at [facility], [address] [place], [place] [address]    Unit Number [id number]    Blood Component Type RBC LR PHER1    Unit division 00    Status of Unit ALLOCATED    Transfusion Status OK TO TRANSFUSE    Crossmatch Result Compatible    Unit Number [id number]    Blood Component Type RBC LR PHER2    Unit division 00    Status of Unit ALLOCATED    Transfusion Status OK TO TRANSFUSE    Crossmatch Result Compatible   Prepare RBC (crossmatch)     Status: None   Collection Time: 05/20/23  7:00 AM  Result  Value Ref Range   Order Confirmation      ORDER PROCESSED BY BLOOD BANK Performed at [facility] Lab, [address], [place], [place] [address]   Osmolality, urine     Status: None   Collection Time: 05/20/23  8:10 AM  Result Value Ref Range   Osmolality, Ur 551 300 - 900 mOsm/kg    Comment: Performed at [facility], [address], [place], [place] [address]  Sodium, urine, random     Status: None   Collection Time: 05/20/23  8:10 AM  Result Value Ref Range   Sodium, Ur <10 mmol/L    Comment: Performed at [facility], [address], [place], [place] [address]  Folate     Status: None   Collection Time: 05/20/23  8:10 AM  Result Value Ref Range   Folate 15.1 >5.9 ng/mL    Comment: Performed at [facility], [address] [place], [place] [address]  Folate     Status: None   Collection Time: 05/20/23  1:09 PM  Result Value Ref Range   Folate 14.2 >5.9 ng/mL    Comment: Performed at [facility] Lab, [address] [place], [place] [address]  Vitamin B12     Status:  None   Collection Time: 05/20/23  1:09 PM  Result Value Ref Range   Vitamin B-12 483 180 - 914 pg/mL    Comment: (NOTE) This assay is not validated for testing neonatal or myeloproliferative syndrome specimens for Vitamin B12 levels. Performed at [facility], [address] [place], [place] [address]   Reticulocytes     Status: Abnormal   Collection Time: 05/20/23  1:09 PM  Result Value Ref Range   Retic Ct Pct 1.6 0.4 - 3.1 %   RBC. 3.92 (L) 4.22 - 5.81 MIL/uL   Retic Count, Absolute 64.3 19.0 - 186.0 K/uL   Immature Retic Fract 13.3 2.3 - 15.9 %    Comment: Performed at [facility] Lab, [address], [place], [place] [address]  CBC     Status: Abnormal   Collection Time: 05/20/23  1:15 PM  Result Value Ref Range   WBC 26.0 (H) 4.0 - 10.5 K/uL   RBC 3.92 (L) 4.22 - 5.81 MIL/uL   Hemoglobin 10.9 (L) 13.0 - 17.0 g/dL   HCT 09.8 (L) 11.9 - 14.7 %   MCV 89.0 80.0 -  100.0 fL   MCH 27.8 26.0 - 34.0 pg   MCHC 31.2 30.0 - 36.0 g/dL   RDW 82.9 56.2 - 13.0 %   Platelets 428 (H) 150 - 400 K/uL   nRBC 0.0 0.0 - 0.2 %    Comment: Performed at [facility] Lab, [address], [place], [place] [address]  Basic metabolic panel     Status: Abnormal   Collection Time: 05/20/23  1:15 PM  Result Value Ref Range   Sodium 127 (L) 135 - 145 mmol/L   Potassium 4.3 3.5 - 5.1 mmol/L   Chloride 96 (L) 98 - 111 mmol/L   CO2 22 22 - 32 mmol/L   Glucose, Bld 154 (H) 70 - 99 mg/dL    Comment: Glucose reference range applies only to samples taken after fasting for at least 8 hours.   BUN 22 (H) 6 - 20 mg/dL   Creatinine, Ser 4.69 0.61 - 1.24 mg/dL   Calcium 8.6 (L) 8.9 - 10.3 mg/dL   GFR, Estimated >62 >95 mL/min    Comment: (NOTE) Calculated using the CKD-EPI Creatinine Equation (2021)    Anion gap 9 5 - 15    Comment: Performed at [facility] Lab, [address], [place], [place] [address]  Iron and TIBC     Status: Abnormal   Collection Time: 05/20/23  1:15 PM  Result Value Ref Range   Iron 15 (L) 45 - 182 ug/dL   TIBC 244 010 - 272 ug/dL   Saturation Ratios 6 (L) 17.9 - 39.5 %   UIBC 248 ug/dL    Comment: Performed at [facility], [address] [place], [place] [address]  Ferritin     Status: Abnormal   Collection Time: 05/20/23  1:15 PM  Result Value Ref Range   Ferritin 503 (H) 24 - 336 ng/mL    Comment: Performed at [facility] Lab, [address], [place], [place] [address]   CT GUIDED PERITONEAL/RETROPERITONEAL FLUID DRAIN BY [facility] CATH  Result Date: 05/20/2023 INDICATION: 62 year old gentleman with left upper quadrant hematoma/fluid collection status post splenectomy presented to the ED with intractable abdominal pain. IR consulted for drain placement. EXAM: CT-guided left upper quadrant drain placement TECHNIQUE: Multidetector CT imaging of the abdomen was performed following the standard protocol without IV contrast. RADIATION DOSE  REDUCTION: This exam was performed according to the departmental dose-optimization program which includes automated exposure control, adjustment of the mA and/or kV according to patient size and/or use of iterative reconstruction technique. MEDICATIONS: The patient is currently admitted to the hospital and receiving intravenous antibiotics. The antibiotics were administered within an appropriate time frame prior to the initiation of the procedure. ANESTHESIA/SEDATION: Moderate (conscious) sedation was employed during this procedure. A total of Versed 4 mg and Fentanyl 100 mcg was administered intravenously by the radiology nurse. Total intra-service moderate Sedation Time: 22 minutes. The patient's level of consciousness and vital signs were monitored continuously by radiology nursing throughout the procedure under my direct supervision. COMPLICATIONS: None immediate. PROCEDURE: Informed written consent was obtained from the patient after a thorough discussion of the procedural risks, benefits and alternatives. All questions were addressed. Maximal Sterile Barrier Technique was utilized including caps, mask, sterile gowns, sterile gloves, sterile drape, hand hygiene and skin antiseptic. A timeout was performed prior to the initiation of the procedure. Patient positioned supine on the procedure table. The left upper quadrant skin prepped and draped in the usual fashion. Following local administration, the left upper quadrant fluid collection was accessed with a 19 gauge Yueh needle utilizing CT guidance. Yueh needle was removed over 0.035 inch guidewire. Serial dilation was performed followed by placement of 12 [place] multipurpose pigtail drain. Approximately 400 mL of bloody fluid was aspirated. Samples were sent for Gram stain and culture. IMPRESSION: Successful CT-guided insertion of 12 French multipurpose pigtail drain in symptomatic left upper quadrant post splenectomy hematoma. Electronically Signed   By:  Acquanetta Belling M.D.   On: 05/20/2023 15:48   CT Angio Chest/Abd/Pel for Dissection W and/or W/WO  Addendum Date: 05/20/2023   ADDENDUM REPORT: 05/20/2023 02:02 ADDENDUM: Additional history was provided in that the patient has recently undergone splenectomy via a chevron incision. In light of this, the collection seen within the left upper quadrant represents a large hematoma within the splenic bed measuring 9.8 x 14.1 x 10.6 cm (volume = 770 cm^3) with a more hyperdense region centrally possibly representing sentinel clot or hemostatic packing material. There are punctate foci of gas within the hematoma at the superior margin though this is nonspecific and may simply represent gas related to open splenectomy. Super infection, however, is not excluded on this examination alone and correlation with the patient's clinical picture is recommended. If indicated, diagnostic sampling of the hematoma for microbial analysis could be considered. Electronically Signed   By: Helyn Numbers M.D.   On: 05/20/2023 02:02   Result Date: 05/20/2023 CLINICAL DATA:  Acute aortic syndrome, abdominal pain, diaphoresis. 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 Multidetector CT imaging through the chest, abdomen and pelvis was performed using the standard protocol during bolus administration of intravenous contrast. Multiplanar reconstructed images and MIPs were obtained and reviewed to evaluate the vascular anatomy. RADIATION DOSE REDUCTION: This exam was performed according to the departmental dose-optimization program which includes automated exposure control, adjustment of the mA and/or kV according to patient size and/or use of iterative reconstruction technique. CONTRAST:  OMNIPAQUE IOHEXOL 350 MG/ML SOLN COMPARISON:  04/20/2023. FINDINGS: CTA CHEST FINDINGS Cardiovascular:  Preferential opacification of the thoracic aorta. No evidence of thoracic aortic aneurysm or dissection. Normal heart size. No pericardial effusion. Mediastinum/Nodes: No enlarged mediastinal, hilar, or axillary lymph nodes. Thyroid gland, trachea, and esophagus demonstrate no significant findings. Lungs/Pleura:  Mild emphysema. No confluent pulmonary infiltrate. No pneumothorax or pleural effusion. No central obstructing lesion. Musculoskeletal: No chest wall abnormality. No acute or significant osseous findings. Review of the MIP images confirms the above findings. CTA ABDOMEN AND PELVIS FINDINGS VASCULAR Aorta: Normal caliber aorta without aneurysm, dissection, vasculitis or significant stenosis. Celiac: Since the prior examination, the pseudoaneurysm within the splenic hilum has thrombosed. Additionally, there is markedly 2 decreased caliber of the splenic artery diffusely which may relate to interval near complete infarction of the spleen described below. The celiac axis itself is widely patent, as is the left gastric artery and common hepatic artery. No aneurysm or dissection. SMA: Patent without evidence of aneurysm, dissection, vasculitis or significant stenosis. Renals: Main renal arteries bilaterally are widely patent and demonstrate normal vascular morphology. Tiny bilateral accessory lower pole renal arteries are noted. IMA: Patent without evidence of aneurysm, dissection, vasculitis or significant stenosis. Inflow: Patent without evidence of aneurysm, dissection, vasculitis or significant stenosis. Veins: No obvious venous abnormality within the limitations of this arterial phase study. Review of the MIP images confirms the above findings. NON-VASCULAR Hepatobiliary: No focal liver abnormality is seen. No gallstones, gallbladder wall thickening, or biliary dilatation. Pancreas: Punctate calcifications within the pancreatic tail, head, and uncinate process are identified in keeping with changes of chronic  pancreatitis. No superimposed acute peripancreatic inflammatory changes or fluid collections are identified. Pancreatic duct is not dilated. 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 Small amount of residual enhancing tissue is seen within the splenic hilum. Adrenals/Urinary Tract: Adrenal glands are unremarkable. Kidneys are normal, without renal calculi, focal lesion, or hydronephrosis. Bladder is unremarkable. Stomach/Bowel: Interval development of mildly hyperdense fluid throughout the abdomen in keeping with mild hemoperitoneum. This is of relatively low attenuation, however (16-18 Hounsfield units) suggesting that this may be subacute in nature or reflect underlying anemia. The stomach, small bowel, and large bowel are unremarkable. Appendix normal. No free intraperitoneal gas. Lymphatic: No pathologic adenopathy within the abdomen and pelvis. Reproductive: Prostate is unremarkable. Other: No abdominal wall hernia Musculoskeletal: Degenerative changes are seen at L1-2. No acute bone abnormality. No lytic or blastic bone lesion. Review of the MIP images confirms the above findings. IMPRESSION: 1. 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 2. Interval development of mild hemoperitoneum throughout the abdomen. This is of relatively low attenuation, however, suggesting that this may be subacute in nature or reflect underlying anemia. 3. No evidence of thoracic or abdominal aortic aneurysm or dissection. 4. Changes of chronic pancreatitis. No superimposed acute peripancreatic inflammatory changes or fluid collections are identified. Emphysema (ICD10-J43.9). Electronically Signed: By: Helyn Numbers M.D. On: 05/20/2023 01:03   DG Chest Port 1 View  Result Date: 05/19/2023 CLINICAL DATA:  Chest and abdominal pain EXAM: PORTABLE CHEST 1 VIEW COMPARISON:  04/22/2023 FINDINGS: Single frontal view of the chest demonstrates a  stable cardiac silhouette. Chronic elevation of the left hemidiaphragm. No acute airspace disease, effusion, or pneumothorax. No acute bony abnormality. IMPRESSION: 1. Stable chest, no acute process. Electronically Signed   By: Sharlet Salina M.D.   On: 05/19/2023 23:52    Pending Labs Unresulted Labs (From admission, onward)     Start     Ordered   05/21/23 0500  Basic metabolic panel  Daily,   R     Question:  Specimen collection method  Answer:  Lab=Lab collect   05/20/23 1550   05/21/23 0500  CBC with Differential/Platelet  Daily,  R     Question:  Specimen collection method  Answer:  Lab=Lab collect   05/20/23 1550   05/21/23 0500  Magnesium  Daily,   R     Question:  Specimen collection method  Answer:  Lab=Lab collect   05/20/23 1550   05/20/23 1458  Aerobic/Anaerobic Culture w Gram Stain (surgical/deep wound)  Once,   STAT        05/20/23 1458   05/20/23 0551  Culture, blood (Routine X 2) w Reflex to ID Panel  BLOOD CULTURE X 2,   R (with TIMED occurrences)     Question Answer Comment  Patient immune status Normal   Release to patient Immediate      05/20/23 0550   05/20/23 0551  Body fluid cell count with differential  Once,   R       Question Answer Comment  Are there also cytology or pathology orders on this specimen? No   Release to patient Immediate      05/20/23 0550   05/20/23 0551  Body fluid culture w Gram Stain  Once,   R       Question Answer Comment  Are there also cytology or pathology orders on this specimen? No   Patient immune status Normal   Release to patient Immediate      05/20/23 0550   05/19/23 2320  Urinalysis, Routine w reflex microscopic -Urine, Clean Catch  Once,   URGENT       Question:  Specimen Source  Answer:  Urine, Clean Catch   05/19/23 2320            Vitals/Pain Today's Vitals   05/20/23 1455 05/20/23 1500 05/20/23 1505 05/20/23 1522  BP: 123/69 119/68 111/74 (!) 123/105  Pulse: 84 83 83 88  Resp: 18 18 19 20   Temp:       TempSrc:      SpO2: 99% 99% 97% 100%  Weight:      PainSc:  0-No pain      Isolation Precautions No active isolations  Medications Medications  ALPRAZolam (XANAX) tablet 0.25 mg (has no administration in time range)  DULoxetine (CYMBALTA) DR capsule 20 mg (0 mg Oral Hold 05/20/23 0937)  methocarbamol (ROBAXIN) tablet 500 mg (500 mg Oral Given 05/20/23 0803)  sodium chloride flush (NS) 0.9 % injection 3 mL (3 mLs Intravenous Not Given 05/20/23 0843)  sodium chloride flush (NS) 0.9 % injection 3 mL (has no administration in time range)  0.9 %  sodium chloride infusion (has no administration in time range)  acetaminophen (TYLENOL) tablet 650 mg (has no administration in time range)    Or  acetaminophen (TYLENOL) suppository 650 mg (has no administration in time range)  oxyCODONE (Oxy IR/ROXICODONE) immediate release tablet 5 mg (has no administration in time range)  ondansetron (ZOFRAN) tablet 4 mg (4 mg Oral Given 05/20/23 0804)    Or  ondansetron (ZOFRAN) injection 4 mg ( Intravenous See Alternative 05/20/23 0804)  hydrALAZINE (APRESOLINE) injection 5 mg (has no administration in time range)  thiamine (VITAMIN B1) tablet 100 mg ( Oral See Alternative 05/20/23 0937)    Or  thiamine (VITAMIN B1) injection 100 mg (0 mg Intravenous Hold 05/20/23 0937)  folic acid (FOLVITE) tablet 1 mg (0 mg Oral Hold 05/20/23 0937)  multivitamin with minerals tablet 1 tablet (0 tablets Oral Hold 05/20/23 0904)  0.9 %  sodium chloride infusion ( Intravenous New Bag/Given 05/20/23 0829)  ciprofloxacin (CIPRO) IVPB 400 mg (0 mg Intravenous Stopped  05/20/23 0815)  metroNIDAZOLE (FLAGYL) IVPB 500 mg (0 mg Intravenous Stopped 05/20/23 0929)  levalbuterol (XOPENEX) nebulizer solution 0.63 mg (has no administration in time range)  0.9 %  sodium chloride infusion (Manually program via Guardrails IV Fluids) (0 mLs Intravenous Hold 05/20/23 0756)  HYDROmorphone (DILAUDID) injection 1.5 mg (1.5 mg Intravenous Given 05/20/23  1600)  nicotine (NICODERM CQ - dosed in mg/24 hours) patch 21 mg (21 mg Transdermal Patch Applied 05/20/23 1025)  prochlorperazine (COMPAZINE) injection 10 mg (10 mg Intravenous Given 05/20/23 1131)  fentaNYL (SUBLIMAZE) injection 50 mcg (50 mcg Intravenous Given 05/19/23 2338)  ondansetron (ZOFRAN) injection 4 mg (4 mg Intravenous Given 05/19/23 2335)  HYDROmorphone (DILAUDID) injection 1 mg (1 mg Intravenous Given 05/20/23 0010)  iohexol (OMNIPAQUE) 350 MG/ML injection 100 mL (100 mLs Intravenous Contrast Given 05/20/23 0040)  HYDROmorphone (DILAUDID) injection 1 mg (1 mg Intravenous Given 05/20/23 0109)  ketamine 50 mg in normal saline 5 mL (10 mg/mL) syringe (21 mg Intravenous Given 05/20/23 0149)  sodium chloride 0.9 % bolus 500 mL (0 mLs Intravenous Stopped 05/20/23 0321)  midazolam (VERSED) injection (1 mg Intravenous Given 05/20/23 1453)  fentaNYL (SUBLIMAZE) injection (25 mcg Intravenous Given 05/20/23 1454)    Mobility walks     Focused Assessments Cardiac Assessment Handoff:  Cardiac Rhythm: Normal sinus rhythm No results found for: "CKTOTAL", "CKMB", "CKMBINDEX", "TROPONINI" Lab Results  Component Value Date   DDIMER 3.19 (H) 03/17/2023   Does the Patient currently have chest pain? No    R Recommendations: See Admitting Provider Note  Report given to:   Additional Notes:

## 2023-05-20 NOTE — Consult Note (Signed)
Reason for Consult/Chief Complaint: Pain s/p splenectomy Consultant: Donata Duff, MD  Samuel Abbott is an 62 y.o. male.   HPI: 62 yo M who is s/p splenectomy on 10/9 who presents this evening due to left shoulder/neck pain radiating to groin. Patient states pain has been present since operation. I recently saw patient in clinic on 10/23 for staple removal and he was complaining of some pain and advised him to schedule OTC meds and gave additional 10 of oxycodone for nighttime only and advised he come to ED if pain worsened. Patient states pain has more or less been stable, difficulty sleeping due to pain at times. Tonight it woke him from his sleep prompting him to come to ED. CT scan obtained showed large hematoma in splenic fossa. Unclear if pain as severe as stated or drug seeking behavior. Asked for dilaudid by name as soon as examined.  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 He has a 60 pack-year smoking history. He has never used smokeless tobacco. He reports that he does not currently use alcohol. He reports that he does not currently use drugs.  Allergies:  Allergies  Allergen Reactions   Amoxil [Amoxicillin] Anaphylaxis   Penicillins Anaphylaxis   Motrin [Ibuprofen] Other (See Comments)    Stomach pain after several uses of 800mg  tablets.    Medications: I have reviewed the patient's current medications.  Results for orders placed or performed during the hospital  encounter of 05/19/23 (from the past 48 hour(s))  Comprehensive metabolic panel     Status: Abnormal   Collection Time: 05/19/23 11:38 PM  Result Value Ref Range   Sodium 129 (L) 135 - 145 mmol/L   Potassium 3.8 3.5 - 5.1 mmol/L   Chloride 96 (L) 98 - 111 mmol/L   CO2 21 (L) 22 - 32 mmol/L   Glucose, Bld 186 (H) 70 - 99 mg/dL    Comment: Glucose reference range applies only to samples taken after fasting for at least 8 hours.   BUN 18 6 - 20 mg/dL   Creatinine, Ser 6.04 0.61 - 1.24 mg/dL   Calcium 8.9 8.9 - 54.0 mg/dL   Total Protein 6.5 6.5 - 8.1 g/dL   Albumin 2.9 (L) 3.5 - 5.0 g/dL   AST 42 (H) 15 - 41 U/L   ALT 32 0 - 44 U/L   Alkaline Phosphatase 93 38 - 126 U/L   Total Bilirubin 0.8 <1.2 mg/dL   GFR, Estimated >98 >11 mL/min    Comment: (NOTE) Calculated using the CKD-EPI Creatinine Equation (2021)    Anion gap 12 5 - 15    Comment: Performed at [facility], [address] [place], [place] [address]  Lipase, blood     Status: Abnormal   Collection Time: 05/19/23 11:38 PM  Result Value Ref Range   Lipase 120 (H) 11 - 51  U/L    Comment: Performed at [facility], [address], [place], [place] [address]  CBC with Differential     Status: Abnormal   Collection Time: 05/19/23 11:38 PM  Result Value Ref Range   WBC 20.1 (H) 4.0 - 10.5 K/uL   RBC 3.65 (L) 4.22 - 5.81 MIL/uL   Hemoglobin 10.2 (L) 13.0 - 17.0 g/dL   HCT 60.4 (L) 54.0 - 98.1 %   MCV 89.6 80.0 - 100.0 fL   MCH 27.9 26.0 - 34.0 pg   MCHC 31.2 30.0 - 36.0 g/dL   RDW 19.1 47.8 - 29.5 %   Platelets 455 (H) 150 - 400 K/uL   nRBC 0.0 0.0 - 0.2 %   Neutrophils Relative % 79 %   Neutro Abs 15.8 (H) 1.7 - 7.7 K/uL   Lymphocytes Relative 12 %   Lymphs Abs 2.5 0.7 - 4.0 K/uL   Monocytes Relative 8 %   Monocytes Absolute 1.7 (H) 0.1 - 1.0 K/uL   Eosinophils Relative 0 %   Eosinophils Absolute 0.0 0.0 - 0.5 K/uL   Basophils Relative 0 %   Basophils Absolute 0.0 0.0 - 0.1 K/uL   Immature  Granulocytes 1 %   Abs Immature Granulocytes 0.12 (H) 0.00 - 0.07 K/uL    Comment: Performed at [facility], [address], [place], [place] [address]  Protime-INR     Status: None   Collection Time: 05/19/23 11:38 PM  Result Value Ref Range   Prothrombin Time 13.8 11.4 - 15.2 seconds   INR 1.0 0.8 - 1.2    Comment: (NOTE) INR goal varies based on device and disease states. Performed at [facility], [address] [place], [place] [address]   Troponin I (High Sensitivity)     Status: None   Collection Time: 05/19/23 11:38 PM  Result Value Ref Range   Troponin I (High Sensitivity) 4 <18 ng/L    Comment: (NOTE) Elevated high sensitivity troponin I (hsTnI) values and significant  changes across serial measurements may suggest ACS but many other  chronic and acute conditions are known to elevate hsTnI results.  Refer to the "Links" section for chest pain algorithms and additional  guidance. Performed at [facility], [address], [place], [place] [address]   I-stat chem 8, ED     Status: Abnormal   Collection Time: 05/19/23 11:40 PM  Result Value Ref Range   Sodium 128 (L) 135 - 145 mmol/L   Potassium 3.9 3.5 - 5.1 mmol/L   Chloride 96 (L) 98 - 111 mmol/L   BUN 18 6 - 20 mg/dL   Creatinine, Ser 9.52 0.61 - 1.24 mg/dL   Glucose, Bld 841 (H) 70 - 99 mg/dL    Comment: Glucose reference range applies only to samples taken after fasting for at least 8 hours.   Calcium, Ion 1.13 (L) 1.15 - 1.40 mmol/L   TCO2 22 22 - 32 mmol/L   Hemoglobin 11.6 (L) 13.0 - 17.0 g/dL   HCT 32.4 (L) 40.1 - 02.7 %  Troponin I (High Sensitivity)     Status: None   Collection Time: 05/20/23  1:33 AM  Result Value Ref Range   Troponin I (High Sensitivity) 4 <18 ng/L    Comment: (NOTE) Elevated high sensitivity troponin I (hsTnI) values and significant  changes across serial measurements may suggest ACS but many other  chronic and acute conditions are known to elevate hsTnI  results.  Refer to the "Links" section for chest pain algorithms and additional  guidance. Performed at [facility], [address], [place], [place] [address]     CT Angio Chest/Abd/Pel for Dissection W and/or W/WO  Addendum Date: 05/20/2023   ADDENDUM REPORT: 05/20/2023 02:02 ADDENDUM: Additional history was provided in that the patient has recently undergone splenectomy via a chevron incision. In light of this, the collection seen within the left upper quadrant represents a large hematoma within the splenic bed measuring 9.8 x 14.1 x 10.6 cm (volume = 770 cm^3) with a more hyperdense region centrally possibly representing sentinel clot or hemostatic packing material. There are punctate foci of gas within the hematoma at the superior margin though this is nonspecific and may simply represent gas related to open splenectomy. Super infection, however, is not excluded on this examination alone and correlation with the patient's clinical picture is recommended. If indicated, diagnostic sampling of the hematoma for microbial analysis could be considered. Electronically Signed   By: Helyn Numbers M.D.   On: 05/20/2023 02:02   Result Date: 05/20/2023 CLINICAL DATA:  Acute aortic syndrome, abdominal pain, diaphoresis. 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 Multidetector CT imaging through the chest, abdomen and pelvis was performed using the standard protocol during bolus administration of intravenous contrast. Multiplanar reconstructed images and MIPs were obtained and reviewed to evaluate the vascular anatomy. RADIATION DOSE REDUCTION: This exam was performed according to the departmental dose-optimization program which includes automated exposure control, adjustment of the mA and/or kV according to patient size and/or use of iterative reconstruction  technique. CONTRAST:  OMNIPAQUE IOHEXOL 350 MG/ML SOLN COMPARISON:  04/20/2023. FINDINGS: CTA CHEST FINDINGS Cardiovascular: Preferential opacification of the thoracic aorta. No evidence of thoracic aortic aneurysm or dissection. Normal heart size. No pericardial effusion. Mediastinum/Nodes: No enlarged mediastinal, hilar, or axillary lymph nodes. Thyroid gland, trachea, and esophagus demonstrate no significant findings. Lungs/Pleura: Mild emphysema. No confluent pulmonary infiltrate. No pneumothorax or pleural effusion. No central obstructing lesion. Musculoskeletal: No chest wall abnormality. No acute or significant osseous findings. Review of the MIP images confirms the above findings. CTA ABDOMEN AND PELVIS FINDINGS VASCULAR Aorta: Normal caliber aorta without aneurysm, dissection, vasculitis or significant stenosis. Celiac: Since the prior examination, the pseudoaneurysm within the splenic hilum has thrombosed. Additionally, there is markedly 2 decreased caliber of the splenic artery diffusely which may relate to interval near complete infarction of the spleen described below. The celiac axis itself is widely patent, as is the left gastric artery and common hepatic artery. No aneurysm or dissection. SMA: Patent without evidence of aneurysm, dissection, vasculitis or significant stenosis. Renals: Main renal arteries bilaterally are widely patent and demonstrate normal vascular morphology. Tiny bilateral accessory lower pole renal arteries are noted. IMA: Patent without evidence of aneurysm, dissection, vasculitis or significant stenosis. Inflow: Patent without evidence of aneurysm, dissection, vasculitis or significant stenosis. Veins: No obvious venous abnormality within the limitations of this arterial phase study. Review of the MIP images confirms the above findings. NON-VASCULAR Hepatobiliary: No focal liver abnormality is seen. No gallstones, gallbladder wall thickening, or biliary dilatation.  Pancreas: Punctate calcifications within the pancreatic tail, head, and uncinate process are identified in keeping with changes of chronic pancreatitis. No superimposed acute peripancreatic inflammatory changes or fluid collections are identified. Pancreatic duct is not dilated. 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 Small amount of residual enhancing  tissue is seen within the splenic hilum. Adrenals/Urinary Tract: Adrenal glands are unremarkable. Kidneys are normal, without renal calculi, focal lesion, or hydronephrosis. Bladder is unremarkable. Stomach/Bowel: Interval development of mildly hyperdense fluid throughout the abdomen in keeping with mild hemoperitoneum. This is of relatively low attenuation, however (16-18 Hounsfield units) suggesting that this may be subacute in nature or reflect underlying anemia. The stomach, small bowel, and large bowel are unremarkable. Appendix normal. No free intraperitoneal gas. Lymphatic: No pathologic adenopathy within the abdomen and pelvis. Reproductive: Prostate is unremarkable. Other: No abdominal wall hernia Musculoskeletal: Degenerative changes are seen at L1-2. No acute bone abnormality. No lytic or blastic bone lesion. Review of the MIP images confirms the above findings. IMPRESSION: 1. 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 2. Interval development of mild hemoperitoneum throughout the abdomen. This is of relatively low attenuation, however, suggesting that this may be subacute in nature or reflect underlying anemia. 3. No evidence of thoracic or abdominal aortic aneurysm or dissection. 4. Changes of chronic pancreatitis. No superimposed acute peripancreatic inflammatory changes or fluid collections are identified. Emphysema (ICD10-J43.9). Electronically Signed: By: Helyn Numbers M.D. On: 05/20/2023 01:03   DG Chest Port 1 View  Result Date: 05/19/2023 CLINICAL DATA:   Chest and abdominal pain EXAM: PORTABLE CHEST 1 VIEW COMPARISON:  04/22/2023 FINDINGS: Single frontal view of the chest demonstrates a stable cardiac silhouette. Chronic elevation of the left hemidiaphragm. No acute airspace disease, effusion, or pneumothorax. No acute bony abnormality. IMPRESSION: 1. Stable chest, no acute process. Electronically Signed   By: Sharlet Salina M.D.   On: 05/19/2023 23:52    ROS 10 point review of systems is negative except as listed above in HPI.   Physical Exam Blood pressure 113/89, pulse (!) 106, temperature (!) 96.4 F (35.8 C), temperature source Axillary, resp. rate 19, weight 70.3 kg, SpO2 100%. Constitutional: mild distress due to pain, minimally diaphoretic, unclear if having withdrawal symptoms HEENT: PERRL Neck: no thyromegaly, trachea midline Chest: normal work of breathing on room air  Abdomen: tender to palpation, voluntary guarding GU: Deferred Extremities: WWP MSK: normal gait/station, no clubbing/cyanosis of fingers/toes, normal ROM of all four extremities Skin: warm, dry, no rashes Psych: normal memory, concern for some signs of withdrawal, fidgety     Assessment/Plan: 62 yo M s/p open splenectomy on 10/9 who presents to ED with left shoulder/neck pain not controlled at home and CT imaging showed large hematoma in splenic fossa  - Recommend medicine admission - Pain control, unclear how much is true pain versus drug seeking behavior - Consider discussion with IR in AM about aspiration of hematoma to send for culture. Unclear if leukocytosis related to splenectomy v infection. No obvious infection on CT imaging but given history of subjective fevers at home reasonable to rule out - Keep NPO for now, will reassess exam later this AM/follow up if any planned intervention before advancing diet. - Would consider CIWA given alcohol abuse history DVT - SCDs,    Dispo - Medicine admission, surgery will continue to follow  I spent a total of 60  minutes in both face-to-face and non-face-to-face activities, excluding procedures performed, for this visit on the date of this encounter. I discussed patient with ED provider Dr. Madilyn Hook. I personally reviewed imaging and interpreted as well as discussed with radiologist to relay patient had splenectomy.    Donata Duff, MD [facility]

## 2023-05-20 NOTE — H&P (Signed)
Chief Complaint: Patient was seen in consultation today for splenic fossa hematoma aspiration and possible drain placement Chief Complaint  Patient presents with   Abdominal Pain   at the request of Dr. Orest Dikes, MD   Supervising Physician: Mir, Mauri Reading  Patient Status: [facility] - Out-pt  History of Present Illness: Samuel Abbott is a 62 y.o. 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 He is followed by Dr. Rosanne Sack at [facility], last seen 10/23. He was treated for pain with OTC meds and 10mg  Oxycodone QHS Patient states pain has more or less been stable, difficulty sleeping due to pain at times.   He presented to [facility] ED by EMS on 11/7 with LUQ pain that woke him from his sleep prompting him to come to ED. Pain radiates to his torso and groin.  CT scan obtained showed large hematoma in splenic fossa. Unclear if pain as severe as stated or drug seeking behavior. Asked for dilaudid by name as soon as examined.   CT Angio Chest/Abd/Pel on 11/7 IMPRESSION: 1. 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 2. Interval development of mild hemoperitoneum throughout the abdomen. This is of relatively low attenuation, however, suggesting that this may be subacute in nature or reflect underlying anemia. 3. No evidence of thoracic or abdominal aortic aneurysm or dissection. 4. Changes of chronic pancreatitis. No superimposed acute peripancreatic inflammatory changes or fluid collections are identified. ADDENDUM: Additional history was provided in that the patient has recently undergone splenectomy via a chevron incision. In light of this, the collection seen within the left upper quadrant represents a large hematoma within the splenic bed measuring 9.8 x 14.1 x 10.6 cm (volume = 770 cm^3) with a more hyperdense region centrally possibly representing sentinel clot or hemostatic packing  material. There are punctate foci of gas within the hematoma at the superior margin though this is nonspecific and may simply represent gas related to open splenectomy. Super infection, however, is not excluded on this examination alone and correlation with the patient's clinical picture is recommended. If indicated, diagnostic sampling of the hematoma for microbial analysis could be considered.   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 Authorizing Provider  acetaminophen (TYLENOL) 500 MG tablet Take 2 tablets (1,000 mg total) by mouth every 6 (six) hours as needed for mild pain, moderate pain, fever or headache. 04/23/23  Yes Barnetta Chapel, PA-C  ALPRAZolam Prudy Feeler) 0.25 MG tablet Take 1 tablet by mouth 2 (two) times daily as needed for anxiety.   Yes [provider]  DULoxetine (CYMBALTA) 20 MG capsule Take 20 mg by mouth daily. For back pain 01/13/21  Yes [provider]  losartan (COZAAR) 25 MG tablet Take 25 mg by mouth daily.  09/16/20  Yes [provider]  metFORMIN (GLUCOPHAGE) 500 MG tablet Take 1 tablet (500 mg total) by mouth daily with breakfast. Patient taking differently: Take 250 mg by mouth 2 (two) times daily. 01/24/21 06/19/23 Yes Maness, Loistine Chance, MD  methocarbamol (ROBAXIN) 500 MG tablet Take 1 tablet (500 mg total) by mouth  every 8 (eight) hours as needed for muscle spasms. 04/23/23  Yes Barnetta Chapel, PA-C  Multiple Vitamin (MULTIVITAMIN) tablet Take 1 tablet by mouth daily.   Yes [provider]  ondansetron (ZOFRAN) 4 MG tablet Take 1 tablet (4 mg total) by mouth every 8 (eight) hours as needed for nausea or vomiting. 04/23/23  Yes Leroy Sea, MD  oxyCODONE (OXY IR/ROXICODONE) 5 MG immediate release tablet Take 5 mg by mouth at bedtime as needed for moderate pain (pain score 4-6).   Yes [provider]  polyethylene glycol powder (GLYCOLAX/MIRALAX) 17 GM/SCOOP powder Take 17 g by mouth daily. 04/23/23  Yes Leroy Sea, MD  predniSONE (DELTASONE) 20 MG tablet Take 20 mg by mouth daily. 05/10/23  Yes [provider]  senna-docusate (SENOKOT-S) 8.6-50 MG tablet Take 2 tablets by mouth at bedtime as needed for mild constipation. 04/23/23  Yes Leroy Sea, MD  tiZANidine (ZANAFLEX) 4 MG tablet Take 4 mg by mouth every 8 (eight) hours as needed for muscle spasms. 05/10/23  Yes [provider]  fenofibrate (TRICOR) 145 MG tablet Take 1 tablet by mouth daily. Patient not taking: Reported on 05/20/2023    [provider]     Family History  Problem Relation Age of Onset   CVA Father     Social History   Socioeconomic History   Marital status: Single    Spouse name: Not on file   Number of children: Not on file   Years of education: Not on file   Highest education level: Not on file  Occupational History   Not on file  Tobacco Use   Smoking status: Every Day    Current packs/day: 1.50    Average packs/day: 1.5 packs/day for 40.0 years (60.0 ttl pk-yrs)    Types: Cigarettes   Smokeless tobacco: Never  Substance and Sexual Activity   Alcohol use: Not Currently    Comment: States no alcohol in over a week   Drug use: Not Currently   Sexual activity: Not Currently  Other Topics Concern   Not on file  Social History Narrative   Not on file    Social Determinants of Health   Financial Resource Strain: Not on file  Food Insecurity: No Food Insecurity (04/21/2023)   Hunger Vital Sign    Worried About Running Out of Food in the Last Year: Never true    Ran Out of Food in the Last Year: Never true  Transportation Needs: No Transportation Needs (04/21/2023)   PRAPARE - Administrator, Civil Service (Medical): No    Lack of Transportation (Non-Medical): No  Physical Activity: Not on file  Stress: Not on file  Social Connections: Not on file    Review of Systems: A 12 point ROS discussed and pertinent positives are indicated in the HPI above.  All other systems are negative.  Review of Systems  Constitutional:  Negative for appetite change, diaphoresis and fatigue.  Respiratory:  Negative for chest tightness, shortness of breath and wheezing.   Cardiovascular:  Negative for chest pain.  Gastrointestinal:  Positive for abdominal pain and nausea. Negative for blood in stool, diarrhea and  vomiting.  Skin:  Negative for color change and pallor.  Neurological:  Negative for weakness and headaches.  Psychiatric/Behavioral:  Negative for behavioral problems and confusion.     Vital Signs: BP 121/70   Pulse 85   Temp 98.1 F (36.7 C) (Oral)   Resp 17   Wt 155 lb (70.3 kg)   SpO2 98%   BMI 21.02 kg/m     Physical Exam Constitutional:      General: He is in acute distress.     Comments: Mild acute distress from pain  Cardiovascular:     Rate and Rhythm: Normal rate and regular rhythm.     Heart sounds: No murmur heard. Pulmonary:     Effort: No respiratory distress.     Breath sounds: Normal breath sounds. No wheezing.  Abdominal:     Tenderness: There is abdominal tenderness in the left upper quadrant.  Skin:    General: Skin is warm and dry.  Neurological:     Mental Status: He is alert and oriented to person, place, and time.  Psychiatric:        Mood and Affect: Mood is anxious.        Behavior:  Behavior normal.     Imaging: CT Angio Chest/Abd/Pel for Dissection W and/or W/WO  Addendum Date: 05/20/2023   ADDENDUM REPORT: 05/20/2023 02:02 ADDENDUM: Additional history was provided in that the patient has recently undergone splenectomy via a chevron incision. In light of this, the collection seen within the left upper quadrant represents a large hematoma within the splenic bed measuring 9.8 x 14.1 x 10.6 cm (volume = 770 cm^3) with a more hyperdense region centrally possibly representing sentinel clot or hemostatic packing material. There are punctate foci of gas within the hematoma at the superior margin though this is nonspecific and may simply represent gas related to open splenectomy. Super infection, however, is not excluded on this examination alone and correlation with the patient's clinical picture is recommended. If indicated, diagnostic sampling of the hematoma for microbial analysis could be considered. Electronically Signed   By: Helyn Numbers M.D.   On: 05/20/2023 02:02   Result Date: 05/20/2023 CLINICAL DATA:  Acute aortic syndrome, abdominal pain, diaphoresis. 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 Multidetector CT imaging through the chest, abdomen and pelvis was performed using the standard protocol during bolus administration of intravenous contrast. Multiplanar reconstructed images and MIPs were obtained and reviewed to evaluate the vascular anatomy. RADIATION DOSE REDUCTION: This exam was performed according to the departmental dose-optimization program which includes automated exposure control, adjustment of the mA and/or kV according to patient size and/or use of iterative reconstruction technique. CONTRAST:  OMNIPAQUE IOHEXOL 350 MG/ML SOLN COMPARISON:  04/20/2023. FINDINGS: CTA CHEST FINDINGS Cardiovascular: Preferential  opacification of the thoracic aorta. No evidence of thoracic aortic aneurysm or dissection. Normal heart size. No pericardial effusion. Mediastinum/Nodes: No enlarged mediastinal, hilar, or axillary lymph nodes. Thyroid gland, trachea, and esophagus demonstrate no significant findings. Lungs/Pleura: Mild emphysema. No confluent pulmonary infiltrate. No pneumothorax or pleural effusion. No central obstructing lesion. Musculoskeletal: No chest wall abnormality. No acute or significant osseous findings. Review of the MIP images confirms the above findings. CTA ABDOMEN AND PELVIS FINDINGS VASCULAR Aorta: Normal caliber aorta without aneurysm, dissection, vasculitis or significant stenosis. Celiac: Since the prior examination, the pseudoaneurysm within the splenic hilum has thrombosed. Additionally, there is markedly 2  decreased caliber of the splenic artery diffusely which may relate to interval near complete infarction of the spleen described below. The celiac axis itself is widely patent, as is the left gastric artery and common hepatic artery. No aneurysm or dissection. SMA: Patent without evidence of aneurysm, dissection, vasculitis or significant stenosis. Renals: Main renal arteries bilaterally are widely patent and demonstrate normal vascular morphology. Tiny bilateral accessory lower pole renal arteries are noted. IMA: Patent without evidence of aneurysm, dissection, vasculitis or significant stenosis. Inflow: Patent without evidence of aneurysm, dissection, vasculitis or significant stenosis. Veins: No obvious venous abnormality within the limitations of this arterial phase study. Review of the MIP images confirms the above findings. NON-VASCULAR Hepatobiliary: No focal liver abnormality is seen. No gallstones, gallbladder wall thickening, or biliary dilatation. Pancreas: Punctate calcifications within the pancreatic tail, head, and uncinate process are identified in keeping with changes of chronic  pancreatitis. No superimposed acute peripancreatic inflammatory changes or fluid collections are identified. Pancreatic duct is not dilated. 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 Small amount of residual enhancing tissue is seen within the splenic hilum. Adrenals/Urinary Tract: Adrenal glands are unremarkable. Kidneys are normal, without renal calculi, focal lesion, or hydronephrosis. Bladder is unremarkable. Stomach/Bowel: Interval development of mildly hyperdense fluid throughout the abdomen in keeping with mild hemoperitoneum. This is of relatively low attenuation, however (16-18 Hounsfield units) suggesting that this may be subacute in nature or reflect underlying anemia. The stomach, small bowel, and large bowel are unremarkable. Appendix normal. No free intraperitoneal gas. Lymphatic: No pathologic adenopathy within the abdomen and pelvis. Reproductive: Prostate is unremarkable. Other: No abdominal wall hernia Musculoskeletal: Degenerative changes are seen at L1-2. No acute bone abnormality. No lytic or blastic bone lesion. Review of the MIP images confirms the above findings. IMPRESSION: 1. 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 2. Interval development of mild hemoperitoneum throughout the abdomen. This is of relatively low attenuation, however, suggesting that this may be subacute in nature or reflect underlying anemia. 3. No evidence of thoracic or abdominal aortic aneurysm or dissection. 4. Changes of chronic pancreatitis. No superimposed acute peripancreatic inflammatory changes or fluid collections are identified. Emphysema (ICD10-J43.9). Electronically Signed: By: Helyn Numbers M.D. On: 05/20/2023 01:03   DG Chest Port 1 View  Result Date: 05/19/2023 CLINICAL DATA:  Chest and abdominal pain EXAM: PORTABLE CHEST 1 VIEW COMPARISON:  04/22/2023 FINDINGS: Single frontal view of the chest demonstrates a  stable cardiac silhouette. Chronic elevation of the left hemidiaphragm. No acute airspace disease, effusion, or pneumothorax. No acute bony abnormality. IMPRESSION: 1. Stable chest, no acute process. Electronically Signed   By: Sharlet Salina M.D.   On: 05/19/2023 23:52   DG Chest Port 1 View  Result Date: 04/22/2023 CLINICAL DATA:  Shortness of breath EXAM: PORTABLE CHEST 1 VIEW COMPARISON:  04/07/2023 FINDINGS: Enteric tube with tip at the lower esophagus. Elevated left diaphragm with mild streaky density behind the heart suggesting atelectasis. There is a perisplenic hematoma by prior CT. No edema, effusion, or pneumothorax. Normal heart size and aortic contours. Artifact from EKG leads. These results will be called to the ordering clinician or representative by the Radiologist Assistant, and communication documented in the PACS or [employer]. IMPRESSION: 1. Enteric tube with tip at the lower esophagus. 2. Elevated left diaphragm with mild atelectasis. Electronically Signed   By: Tiburcio Pea M.D.   On: 04/22/2023 07:01   CT Angio Chest/Abd/Pel for Dissection W  and/or Wo Contrast  Result Date: 04/20/2023 CLINICAL DATA:  Left flank pain radiating to left shoulder. History of perisplenic hematoma. EXAM: CT ANGIOGRAPHY CHEST, ABDOMEN AND PELVIS TECHNIQUE: Non-contrast CT of the chest was initially obtained. Multidetector CT imaging through the chest, abdomen and pelvis was performed using the standard protocol during bolus administration of intravenous contrast. Multiplanar reconstructed images and MIPs were obtained and reviewed to evaluate the vascular anatomy. RADIATION DOSE REDUCTION: This exam was performed according to the departmental dose-optimization program which includes automated exposure control, adjustment of the mA and/or kV according to patient size and/or use of iterative reconstruction technique. CONTRAST:  OMNIPAQUE IOHEXOL 350 MG/ML SOLN COMPARISON:  04/07/2023 FINDINGS:  CTA CHEST FINDINGS Cardiovascular: Heart is normal size. Aorta is normal caliber. No dissection No filling defects in the pulmonary arteries to suggest pulmonary emboli. Mediastinum/Nodes: No mediastinal, hilar, or axillary adenopathy. Trachea and esophagus are unremarkable. Thyroid unremarkable. Lungs/Pleura: Biapical scarring. Trace left pleural effusion. No confluent opacities or pneumothorax. Musculoskeletal: Chest wall soft tissues are unremarkable. No acute bony abnormality. Review of the MIP images confirms the above findings. CTA ABDOMEN AND PELVIS FINDINGS VASCULAR Aorta: Normal caliber aorta without aneurysm, dissection, vasculitis or significant stenosis. Celiac: Patent. 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 This is concerning for the source of the perisplenic hematoma. SMA: Patent without evidence of aneurysm, dissection, vasculitis or significant stenosis. Renals: Both renal arteries are patent without evidence of aneurysm, dissection, vasculitis, fibromuscular dysplasia or significant stenosis. IMA: Patent without evidence of aneurysm, dissection, vasculitis or significant stenosis. Inflow: Patent without evidence of aneurysm, dissection, vasculitis or significant stenosis. Veins: No obvious venous abnormality within the limitations of this arterial phase study. Review of the MIP images confirms the above findings. NON-VASCULAR Hepatobiliary: No focal hepatic abnormality. Gallbladder unremarkable. Pancreas: Calcifications throughout the pancreas compatible with chronic pancreatitis. No evidence of acute pancreatitis or ductal dilatation. Spleen: Large perisplenic hematoma again noted, measuring approximately 13.5 x 6.2 cm. With measured in the same planes and at the same level on prior study, this measured approximately 13 x 4.4 cm. This is felt to be slightly larger than prior study. Adrenals/Urinary  Tract: No adrenal abnormality. No focal renal abnormality. No stones or hydronephrosis. Urinary bladder is unremarkable. Stomach/Bowel: Normal appendix. Stomach, large and small bowel grossly unremarkable. Lymphatic: No adenopathy Reproductive: No visible focal abnormality. Other: No free fluid or free air. Musculoskeletal: No acute bony abnormality. Review of the MIP images confirms the above findings. IMPRESSION: 1.5 cm rounded enhancing hypervascular area adjacent to the splenic artery in the region of the splenic hilum adjacent to the pancreatic tail. 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 Large perisplenic hematoma, 13.5 x 6.2 cm, slightly increased in size since prior study. These results were called by telephone at the time of interpretation on 04/20/2023 at 4:33 pm to provider [facility] , who verbally acknowledged these results. Electronically Signed   By: Charlett Nose M.D.   On: 04/20/2023 16:34    Labs:  CBC: Recent Labs    04/22/23 0612 04/23/23 0432 04/23/23 0830 05/19/23 2338 05/19/23 2340  WBC 19.0* 15.3* 15.7* 20.1*  --   HGB 8.7* 7.0* 7.9* 10.2* 11.6*  HCT 26.1* 20.9* 23.0* 32.7* 34.0*  PLT 348 322 349 455*  --     COAGS: Recent Labs    04/21/23 0122 05/19/23 2338  INR 1.1 1.0    BMP: Recent  Labs    04/21/23 0402 04/21/23 1338 04/22/23 0612 04/23/23 0432 05/19/23 2338 05/19/23 2340  NA 132*   < > 135 132* 129* 128*  K 3.6   < > 4.5 4.1 3.8 3.9  CL 99  --  99 97* 96* 96*  CO2 25  --  26 27 21*  --   GLUCOSE 134*  --  129* 117* 186* 183*  BUN 11  --  9 9 18 18   CALCIUM 8.8*  --  8.9 8.5* 8.9  --   CREATININE 0.60*  --  0.68 0.54* 0.74 0.70  GFRNONAA >60  --  >60 >60 >60  --    < > = values in this interval not displayed.    LIVER FUNCTION TESTS: Recent Labs    04/07/23 1243 04/20/23 1245 04/21/23 0402 05/19/23 2338  BILITOT 1.1 0.5 0.7 0.8  AST 23 36 18 42*  ALT 22 99* 73* 32  ALKPHOS 114 117 98 93  PROT 8.1 6.9 6.0* 6.5   ALBUMIN 3.7 3.2* 2.7* 2.9*    TUMOR MARKERS: No results for input(s): "AFPTM", "CEA", "CA199", "CHROMGRNA" in the last 8760 hours.  Assessment and Plan:  Patient presents for splenic fossa hematoma aspiration and possible drain placement. Risks and benefits discussed with the patient including bleeding, infection, damage to adjacent structures, bowel perforation/fistula connection, and sepsis.  All of the patient's questions were answered, patient is agreeable to proceed. Consent signed and in chart.   Thank you for this interesting consult.  I greatly enjoyed meeting Samuel Abbott and look forward to participating in their care.  A copy of this report was sent to the requesting provider on this date.  Electronically Signed: Robet Leu, PA-C 05/20/2023, 9:46 AM   I spent a total of  30 Minutes   in face to face in clinical consultation, greater than 50% of which was counseling/coordinating care for splenic fossa hematoma aspiration and possible drain placement.

## 2023-05-20 NOTE — ED Notes (Signed)
Pt returned from IR.

## 2023-05-20 NOTE — Procedures (Signed)
Interventional Radiology Procedure Note  Procedure: CT guided left upper quadrant hematoma  Indication: Splenic bed hematoma  Findings: Please refer to procedural dictation for full description.  Complications: None  EBL: < 10 mL  Acquanetta Belling, MD [phone]

## 2023-05-20 NOTE — ED Notes (Signed)
Pt tx to IR on transport monitor

## 2023-05-20 NOTE — ED Notes (Signed)
Admitting provider at bedside and updated that pt is reporting that the dilaudid isn't helping.

## 2023-05-20 NOTE — Hospital Course (Addendum)
Mr. Nevares is a 62 y.o. 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  He was hospitalized in of September and again in October. He again returned with ongoing abdominal pain.  CT abdomen/pelvis showed underlying large hematoma within the splenic bed measuring 9.8 x 14.1 x 10.6 cm.  He was admitted for IR evaluation for possible aspiration of fluid collection. He was afebrile but had ongoing leukocytosis higher than prior values and was started on antibiotics in case of infection related to hematoma.

## 2023-05-20 NOTE — ED Notes (Signed)
IR providers at bedside and want pt to be NPO

## 2023-05-20 NOTE — Plan of Care (Signed)

## 2023-05-20 NOTE — Progress Notes (Signed)
Progress Note    MIKLOS BIDINGER   ONG:[id number]  DOB: [birth year]  DOA: 05/19/2023     0 PCP: Norm Salt, PA  Initial CC: abdominal pain  Hospital Course: Mr. Schembri is a 62 y.o. 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  He was hospitalized in of September and again in October. He again returned with ongoing abdominal pain.  CT abdomen/pelvis showed underlying large hematoma within the splenic bed measuring 9.8 x 14.1 x 10.6 cm.  He was admitted for IR evaluation for possible aspiration of fluid collection. He was afebrile but had ongoing leukocytosis higher than prior values and was started on antibiotics in case of infection related to hematoma.   Interval History:  Seen in ER. Comfortable but complaining of uncontrolled pain from dilaudid. Vitals noted to be stable and he was in no distress. Discussed plan for IR eval to see if hematoma can be aspirated and possible drain re-inserted.   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  Tentative plan for aspiration and possible drain placement -Continuing pain control for now - Continue Cipro and Flagyl given underlying persistent leukocytosis and possible infected fluid collection.  - Follow-up fluid studies   Normocytic anemia -Despite large hematoma, hemoglobin improved/stable.  Does not appear to be significantly volume depleted -Will continue to trend and transfuse if necessary   Persistent abdominal pain and spasm - notes reviewed; some pain seeking behavior in past hospitalizations  - continue dilaudid and oxy; will treat pain as appropriate - continue robaxin    Transaminitis -  Slightly elevated AST 42.  Normal ALT 32 and bilirubin 0.08 - Continue to monitor hepatic panel -Patient still endorsing alcohol use   Chronic pancreatitis -Patient is complaining about severe abdominal pain mostly on the left upper quadrant.  Lipase 120. - CT abdomen is of chronic pancreatitis.  No superimposed acute peripancreatic inflammatory change or fluid collection. - still drinking etoh; last drink a couple days PTA, if no CIWA scores will d/c CIWA   Hyponatremia - Serum sodium 129 and low chloride 96..  Patient baseline sodium around 131-132. -Hyponatremia secondary to poor oral solute intake. -Continue NS 50 cc/h for 1 day - trend BMP - check urine studies     Emphysema - CTA chest abdomen pelvis showed emphysema. - continue nebs as needed   Chronic pain syndrome -Patient has history of chronic pain syndrome.  At home patient is on Robaxin, Zanaflex and Roxicodone. - Currently continuing Roxicodone and Robaxin.   Hyperlipidemia -Continue Tricor once daily.   Essential hypertension - Holding losartan in the setting of SIRS. - Continue hydralazine as needed.   Generalized anxiety disorder -Continue duloxetine 20 mg daily and Xanax 0.25 mg twice daily as needed for anxiety.   Chronic alcohol use - Patient reported he has been decreased drinking alcohol over the course of last 1 week and last drink was about 3 to 4 days ago. - Continue CIWA protocol with Ativan as needed - Continue folic acid, multivitamin and thiamine. - Continue cardiac monitoring.  Old records reviewed in assessment of this patient  Antimicrobials: Ciprofloxacin 05/20/2023 >> current Flagyl 05/20/2023 >> current  DVT prophylaxis:  SCDs Start: 05/20/23 0551  Code Status:   Code Status: Full Code  Mobility Assessment (Last 72 Hours)     Mobility Assessment     Row Name 05/20/23 08:37:27           Does patient have an order for bedrest or is patient medically unstable No - Continue  assessment       What is the highest level of mobility based on the progressive mobility assessment? Level 6 (Walks independently in room and hall) - Balance while walking in room without assist - Complete                Barriers to discharge: none Disposition Plan:  Home Status is: Inpt  Objective: Blood pressure 122/80, pulse 84, temperature 98.1 F (36.7 C), temperature source Oral, resp. rate 15, weight 70.3 kg, SpO2 95%.  Examination:  Physical Exam Constitutional:      General: He is not in acute distress.    Appearance: Normal appearance.  HENT:     Head: Normocephalic and atraumatic.     Mouth/Throat:     Mouth: Mucous membranes are moist.  Eyes:     Extraocular Movements: Extraocular movements intact.  Cardiovascular:     Rate and Rhythm: Normal rate and regular rhythm.  Pulmonary:     Effort: Pulmonary effort is normal. No respiratory distress.     Breath sounds: Normal breath sounds. No wheezing.  Abdominal:     General: Bowel sounds are normal. There is no distension.     Palpations: Abdomen is soft.     Tenderness: There is abdominal tenderness (generalized). There is no guarding.     Comments: Splenectomy scar noted and healed well  Musculoskeletal:        General: Normal range of motion.     Cervical back: Normal range of motion and neck supple.  Skin:    General: Skin is warm and dry.  Neurological:     General: No focal deficit present.     Mental Status: He is alert.  Psychiatric:        Mood and Affect: Mood normal.        Behavior: Behavior normal.      Consultants:  IR  Procedures:    Data Reviewed: Results for orders placed or performed during the hospital encounter of 05/19/23 (from the past 24 hour(s))  Comprehensive metabolic panel     Status: Abnormal   Collection Time: 05/19/23 11:38 PM  Result Value Ref Range   Sodium 129 (L) 135 - 145 mmol/L   Potassium 3.8 3.5 - 5.1 mmol/L   Chloride 96 (L) 98 - 111 mmol/L   CO2 21 (L) 22  - 32 mmol/L   Glucose, Bld 186 (H) 70 - 99 mg/dL   BUN 18 6 - 20 mg/dL   Creatinine, Ser 1.61 0.61 - 1.24 mg/dL   Calcium 8.9 8.9 - 09.6 mg/dL   Total Protein 6.5 6.5 - 8.1 g/dL   Albumin 2.9 (L) 3.5 - 5.0 g/dL   AST 42 (H) 15 - 41 U/L   ALT 32 0 - 44 U/L   Alkaline Phosphatase 93 38 - 126 U/L   Total Bilirubin 0.8 <1.2 mg/dL   GFR, Estimated >04 >54 mL/min   Anion gap 12 5 - 15  Lipase, blood     Status: Abnormal   Collection Time: 05/19/23 11:38 PM  Result Value Ref Range   Lipase 120 (H) 11 - 51 U/L  CBC with Differential     Status: Abnormal  Collection Time: 05/19/23 11:38 PM  Result Value Ref Range   WBC 20.1 (H) 4.0 - 10.5 K/uL   RBC 3.65 (L) 4.22 - 5.81 MIL/uL   Hemoglobin 10.2 (L) 13.0 - 17.0 g/dL   HCT 16.1 (L) 09.6 - 04.5 %   MCV 89.6 80.0 - 100.0 fL   MCH 27.9 26.0 - 34.0 pg   MCHC 31.2 30.0 - 36.0 g/dL   RDW 40.9 81.1 - 91.4 %   Platelets 455 (H) 150 - 400 K/uL   nRBC 0.0 0.0 - 0.2 %   Neutrophils Relative % 79 %   Neutro Abs 15.8 (H) 1.7 - 7.7 K/uL   Lymphocytes Relative 12 %   Lymphs Abs 2.5 0.7 - 4.0 K/uL   Monocytes Relative 8 %   Monocytes Absolute 1.7 (H) 0.1 - 1.0 K/uL   Eosinophils Relative 0 %   Eosinophils Absolute 0.0 0.0 - 0.5 K/uL   Basophils Relative 0 %   Basophils Absolute 0.0 0.0 - 0.1 K/uL   Immature Granulocytes 1 %   Abs Immature Granulocytes 0.12 (H) 0.00 - 0.07 K/uL  Protime-INR     Status: None   Collection Time: 05/19/23 11:38 PM  Result Value Ref Range   Prothrombin Time 13.8 11.4 - 15.2 seconds   INR 1.0 0.8 - 1.2  Troponin I (High Sensitivity)     Status: None   Collection Time: 05/19/23 11:38 PM  Result Value Ref Range   Troponin I (High Sensitivity) 4 <18 ng/L  I-stat chem 8, ED     Status: Abnormal   Collection Time: 05/19/23 11:40 PM  Result Value Ref Range   Sodium 128 (L) 135 - 145 mmol/L   Potassium 3.9 3.5 - 5.1 mmol/L   Chloride 96 (L) 98 - 111 mmol/L   BUN 18 6 - 20 mg/dL   Creatinine, Ser 7.82 0.61 - 1.24  mg/dL   Glucose, Bld 956 (H) 70 - 99 mg/dL   Calcium, Ion 2.13 (L) 1.15 - 1.40 mmol/L   TCO2 22 22 - 32 mmol/L   Hemoglobin 11.6 (L) 13.0 - 17.0 g/dL   HCT 08.6 (L) 57.8 - 46.9 %  Troponin I (High Sensitivity)     Status: None   Collection Time: 05/20/23  1:33 AM  Result Value Ref Range   Troponin I (High Sensitivity) 4 <18 ng/L  Type and screen [facility]     Status: None (Preliminary result)   Collection Time: 05/20/23  7:00 AM  Result Value Ref Range   ABO/RH(D) O POS    Antibody Screen NEG    Sample Expiration      05/23/2023,2359 Performed at [facility], [address], [place], [place] [address]    Unit Number [id number]    Blood Component Type RBC LR PHER1    Unit division 00    Status of Unit ALLOCATED    Transfusion Status OK TO TRANSFUSE    Crossmatch Result Compatible    Unit Number [id number]    Blood Component Type RBC LR PHER2    Unit division 00    Status of Unit ALLOCATED    Transfusion Status OK TO TRANSFUSE    Crossmatch Result Compatible   Prepare RBC (crossmatch)     Status: None   Collection Time: 05/20/23  7:00 AM  Result Value Ref Range   Order Confirmation      ORDER PROCESSED BY BLOOD BANK Performed at [facility], [address]  [place], [place] [address]   Osmolality, urine     Status: None   Collection Time: 05/20/23  8:10 AM  Result Value Ref Range   Osmolality, Ur 551 300 - 900 mOsm/kg  Sodium, urine, random     Status: None   Collection Time: 05/20/23  8:10 AM  Result Value Ref Range   Sodium, Ur <10 mmol/L  Folate     Status: None   Collection Time: 05/20/23  8:10 AM  Result Value Ref Range   Folate 15.1 >5.9 ng/mL    I have reviewed pertinent nursing notes, vitals, labs, and images as necessary. I have ordered labwork to follow up on as indicated.  I have reviewed the last notes from staff over past 24 hours. I have discussed patient's care plan and test results with nursing staff, CM/SW,  and other staff as appropriate.  Time spent: Greater than 50% of the 55 minute visit was spent in counseling/coordination of care for the patient as laid out in the A&P.   LOS: 0 days   Lewie Chamber, MD [facility] 05/20/2023, 12:47 PM

## 2023-05-20 NOTE — H&P (Signed)
History and Physical    Samuel Abbott NWG:[id number] DOB: 1960/10/26 DOA: 05/19/2023  PCP: Norm Salt, PA   Patient coming from: Home   Chief Complaint:  Chief Complaint  Patient presents with   Abdominal Pain   ED TRIAGE note:Pt BIB [facility] from home with abdominal pain that woke him up from sleep. Pt reports he woke up in cold sweat with sharp pains radiating from his whole torso down to his groin. S/p splenectomy x3 weeks ago. Denies any urinary or bowel changes. Pt reports he was fine before he went to bed. Endorses nausea with dry heaves.   HPI:  Samuel Abbott is a 62 y.o. 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  Patient reported he woke up earlier this morning with cold sweat with sharp pain of the abdomen radiating whole torso down to his groin.  Patient denies any fever, nausea, vomiting, constipation or diarrhea.  Denies any chest pain, palpitation, headache, blurry vision, shortness of breath and lower extremity swelling.    At presentation to ED patient is hemodynamically stable except temperature 96.4.  And tachycardia 106. -UA pending - CMP showed low sodium 129, chloride 96, bicarb 21, blood glucose 186, low albumin 2.9 and elevated AST 42. -Elevated lipase 120. - CBC showing leukocytosis 20.1, hemoglobin 10.2, hematocrit 32 and platelet count 455. -High sensitive troponin within normal range  - CT chest/abdomAdditional history was provided in that the patient has recently undergone splenectomy via a chevron incision.  In light of this, the collection seen within the  left upper quadrant represents a large hematoma within the splenic bed measuring 9.8 x 14.1 x 10.6 cm(volume = 770 cm^3) with a more hyperdense region centrally possibly representing sentinel clot or hemostatic packing material. There are punctate foci of gas within the hematoma at the superior margin though this is nonspecific and may simply represent gas related to open splenectomy.  General surgery Dr. Azucena Cecil amended pain control and at this point unclear it is true pain versus drug-seeking behavior.  Recommended discussion with IR in the morning for aspiration of the hematoma and send it for culture.  Unclear if leukocytosis related to splenectomy versus infection.  There is no obvious infection on CT imaging but given history of subjective fever at home reasonable to rule it out.  Recommended to keep patient n.p.o. and general surgery will reassess in the morning/follow-up if any planned intervention before advancing diet.  Also recommended consider CIWA for alcohol abuse and history.  ED physician Dr. Pauline Aus stated that patient is complaining about significant abdominal pain which required required multiple doses of fentanyl and Dilaudid.  Hospitalist has been contacted for further evaluation management of abdominal pain and hematoma management.   Review of Systems:  Review of Systems  Constitutional:  Positive for chills. Negative for fever, malaise/fatigue and weight loss.  Eyes:  Negative for blurred vision.  Respiratory:  Negative for cough and shortness of breath.   Cardiovascular:  Negative for chest pain and palpitations.  Gastrointestinal:  Positive for abdominal pain and nausea. Negative for constipation, diarrhea, heartburn and vomiting.  Musculoskeletal:  Negative for back pain, joint pain, myalgias and neck pain.  Neurological:  Negative for dizziness  and headaches.  Psychiatric/Behavioral:  The patient is not nervous/anxious.     Past Medical History:  Diagnosis Date    Acute pancreatitis    Alcohol abuse    Diabetes mellitus without complication (HCC)    Eczema    High cholesterol    Hypertension    Tobacco abuse     Past Surgical History:  Procedure Laterality Date   SPLENECTOMY, TOTAL N/A 04/21/2023   Procedure: OPEN SPLENECTOMY;  Surgeon: [name], MD;  Location: [facility] OR;  Service: General;  Laterality: N/A;   WRIST SURGERY       reports that he has been smoking cigarettes. He has a 60 pack-year smoking history. He has never used smokeless tobacco. He reports that he does not currently use alcohol. He reports that he does not currently use drugs.  Allergies  Allergen Reactions   Amoxil [Amoxicillin] Anaphylaxis   Penicillins Anaphylaxis   Motrin [Ibuprofen] Other (See Comments)    Stomach pain after several uses of 800mg  tablets.    Family History  Problem Relation Age of Onset   CVA Father     Prior to Admission medications   Medication Sig Start Date End Date Taking? Authorizing Provider  acetaminophen (TYLENOL) 500 MG tablet Take 2 tablets (1,000 mg total) by mouth every 6 (six) hours as needed for mild pain, moderate pain, fever or headache. 04/23/23  Yes Barnetta Chapel, PA-C  ALPRAZolam Prudy Feeler) 0.25 MG tablet Take 1 tablet by mouth 2 (two) times daily as needed for anxiety.   Yes [provider]  DULoxetine (CYMBALTA) 20 MG capsule Take 20 mg by mouth daily. For back pain 01/13/21  Yes [provider]  losartan (COZAAR) 25 MG tablet Take 25 mg by mouth daily. 09/16/20  Yes [provider]  metFORMIN (GLUCOPHAGE) 500 MG tablet Take 1 tablet (500 mg total) by mouth daily with breakfast. Patient taking differently: Take 250 mg by mouth 2 (two) times daily. 01/24/21 06/19/23 Yes Maness, Loistine Chance, MD  methocarbamol (ROBAXIN) 500 MG tablet Take 1 tablet (500 mg total) by mouth every 8 (eight) hours as needed for muscle spasms. 04/23/23  Yes Barnetta Chapel, PA-C  Multiple Vitamin (MULTIVITAMIN) tablet Take 1 tablet by  mouth daily.   Yes [provider]  ondansetron (ZOFRAN) 4 MG tablet Take 1 tablet (4 mg total) by mouth every 8 (eight) hours as needed for nausea or vomiting. 04/23/23  Yes Leroy Sea, MD  oxyCODONE (OXY IR/ROXICODONE) 5 MG immediate release tablet Take 5 mg by mouth at bedtime as needed for moderate pain (pain score 4-6).   Yes [provider]  polyethylene glycol powder (GLYCOLAX/MIRALAX) 17 GM/SCOOP powder Take 17 g by mouth daily. 04/23/23  Yes Leroy Sea, MD  predniSONE (DELTASONE) 20 MG tablet Take 20 mg by mouth daily. 05/10/23  Yes [provider]  senna-docusate (SENOKOT-S) 8.6-50 MG tablet Take 2 tablets by mouth at bedtime as needed for mild constipation. 04/23/23  Yes Leroy Sea, MD  tiZANidine (ZANAFLEX) 4 MG tablet Take 4 mg by mouth every 8 (eight) hours as needed for muscle spasms. 05/10/23  Yes [provider]  fenofibrate (TRICOR) 145 MG tablet Take 1 tablet by mouth daily. Patient not taking: Reported on 05/20/2023    [provider]     Physical Exam: Vitals:   05/20/23 0325 05/20/23 0330 05/20/23 0430 05/20/23 0538  BP:  134/63 137/71 127/69  Pulse:  86 94 99  Resp:  19 (!) 24 (!) 22  Temp: 98.2 F (36.8 C)     TempSrc:      SpO2:  100% 100% 97%  Weight:        Physical Exam Constitutional:      General: He is not in acute distress.    Appearance: He is ill-appearing.  Cardiovascular:     Rate and Rhythm: Normal rate and regular rhythm.     Heart sounds: Normal heart sounds.  Abdominal:     General: Abdomen is flat. Bowel sounds are normal. There is no distension.     Palpations: Abdomen is soft. There is no shifting dullness, hepatomegaly, splenomegaly or mass.     Tenderness: There is abdominal tenderness in the left upper quadrant. There is guarding. There is no rebound.     Hernia: No hernia is present.  Skin:    General: Skin is dry.     Capillary Refill: Capillary refill takes less  than 2 seconds.  Neurological:     Mental Status: He is oriented to person, place, and time.  Psychiatric:        Mood and Affect: Mood normal.      Labs on Admission: I have personally reviewed following labs and imaging studies  CBC: Recent Labs  Lab 05/19/23 2338 05/19/23 2340  WBC 20.1*  --   NEUTROABS 15.8*  --   HGB 10.2* 11.6*  HCT 32.7* 34.0*  MCV 89.6  --   PLT 455*  --    Basic Metabolic Panel: Recent Labs  Lab 05/19/23 2338 05/19/23 2340  NA 129* 128*  K 3.8 3.9  CL 96* 96*  CO2 21*  --   GLUCOSE 186* 183*  BUN 18 18  CREATININE 0.74 0.70  CALCIUM 8.9  --    GFR: Estimated Creatinine Clearance: 97.6 mL/min (by C-G formula based on SCr of 0.7 mg/dL). Liver Function Tests: Recent Labs  Lab 05/19/23 2338  AST 42*  ALT 32  ALKPHOS 93  BILITOT 0.8  PROT 6.5  ALBUMIN 2.9*   Recent Labs  Lab 05/19/23 2338  LIPASE 120*   No results for input(s): "AMMONIA" in the last 168 hours. Coagulation Profile: Recent Labs  Lab 05/19/23 2338  INR 1.0   Cardiac Enzymes: Recent Labs  Lab 05/19/23 2338 05/20/23 0133  TROPONINIHS 4 4   BNP (last 3 results) Recent Labs    04/22/23 0612 04/23/23 0432  BNP 38.2 46.1   HbA1C: No results for input(s): "HGBA1C" in the last 72 hours. CBG: No results for input(s): "GLUCAP" in the last 168 hours. Lipid Profile: No results for input(s): "CHOL", "HDL", "LDLCALC", "TRIG", "CHOLHDL", "LDLDIRECT" in the last 72 hours. Thyroid Function Tests: No results for input(s): "TSH", "T4TOTAL", "FREET4", "T3FREE", "THYROIDAB" in the last 72 hours. Anemia Panel: No results for input(s): "VITAMINB12", "FOLATE", "FERRITIN", "TIBC", "IRON", "RETICCTPCT" in the last 72 hours. Urine analysis:    Component Value Date/Time   COLORURINE YELLOW 12/03/2022 1700   APPEARANCEUR CLEAR 12/03/2022 1700   LABSPEC >1.046 (H) 12/03/2022 1700   PHURINE 6.5 12/03/2022 1700   GLUCOSEU NEGATIVE 12/03/2022 1700   HGBUR NEGATIVE  12/03/2022 1700   BILIRUBINUR NEGATIVE 12/03/2022 1700   KETONESUR 15 (A) 12/03/2022 1700   PROTEINUR TRACE (A) 12/03/2022 1700   NITRITE NEGATIVE 12/03/2022 1700   LEUKOCYTESUR NEGATIVE 12/03/2022 1700    Radiological Exams on Admission: I have personally reviewed images CT Angio Chest/Abd/Pel for Dissection W and/or W/WO  Addendum Date: 05/20/2023   ADDENDUM REPORT: 05/20/2023 02:02 ADDENDUM: Additional history was  provided in that the patient has recently undergone splenectomy via a chevron incision. In light of this, the collection seen within the left upper quadrant represents a large hematoma within the splenic bed measuring 9.8 x 14.1 x 10.6 cm (volume = 770 cm^3) with a more hyperdense region centrally possibly representing sentinel clot or hemostatic packing material. There are punctate foci of gas within the hematoma at the superior margin though this is nonspecific and may simply represent gas related to open splenectomy. Super infection, however, is not excluded on this examination alone and correlation with the patient's clinical picture is recommended. If indicated, diagnostic sampling of the hematoma for microbial analysis could be considered. Electronically Signed   By: Helyn Numbers M.D.   On: 05/20/2023 02:02   Result Date: 05/20/2023 CLINICAL DATA:  Acute aortic syndrome, abdominal pain, diaphoresis. 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 Multidetector CT imaging through the chest, abdomen and pelvis was performed using the standard protocol during bolus administration of intravenous contrast. Multiplanar reconstructed images and MIPs were obtained and reviewed to evaluate the vascular anatomy. RADIATION DOSE REDUCTION: This exam was performed according to the departmental dose-optimization program which includes automated exposure  control, adjustment of the mA and/or kV according to patient size and/or use of iterative reconstruction technique. CONTRAST:  OMNIPAQUE IOHEXOL 350 MG/ML SOLN COMPARISON:  04/20/2023. FINDINGS: CTA CHEST FINDINGS Cardiovascular: Preferential opacification of the thoracic aorta. No evidence of thoracic aortic aneurysm or dissection. Normal heart size. No pericardial effusion. Mediastinum/Nodes: No enlarged mediastinal, hilar, or axillary lymph nodes. Thyroid gland, trachea, and esophagus demonstrate no significant findings. Lungs/Pleura: Mild emphysema. No confluent pulmonary infiltrate. No pneumothorax or pleural effusion. No central obstructing lesion. Musculoskeletal: No chest wall abnormality. No acute or significant osseous findings. Review of the MIP images confirms the above findings. CTA ABDOMEN AND PELVIS FINDINGS VASCULAR Aorta: Normal caliber aorta without aneurysm, dissection, vasculitis or significant stenosis. Celiac: Since the prior examination, the pseudoaneurysm within the splenic hilum has thrombosed. Additionally, there is markedly 2 decreased caliber of the splenic artery diffusely which may relate to interval near complete infarction of the spleen described below. The celiac axis itself is widely patent, as is the left gastric artery and common hepatic artery. No aneurysm or dissection. SMA: Patent without evidence of aneurysm, dissection, vasculitis or significant stenosis. Renals: Main renal arteries bilaterally are widely patent and demonstrate normal vascular morphology. Tiny bilateral accessory lower pole renal arteries are noted. IMA: Patent without evidence of aneurysm, dissection, vasculitis or significant stenosis. Inflow: Patent without evidence of aneurysm, dissection, vasculitis or significant stenosis. Veins: No obvious venous abnormality within the limitations of this arterial phase study. Review of the MIP images confirms the above findings. NON-VASCULAR Hepatobiliary: No  focal liver abnormality is seen. No gallstones, gallbladder wall thickening, or biliary dilatation. Pancreas: Punctate calcifications within the pancreatic tail, head, and uncinate process are identified in keeping with changes of chronic pancreatitis. No superimposed acute peripancreatic inflammatory changes or fluid collections are identified. Pancreatic duct is not dilated. 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 Small amount of residual enhancing tissue is seen within the splenic hilum. Adrenals/Urinary Tract: Adrenal glands are unremarkable. Kidneys are normal, without renal calculi, focal lesion, or hydronephrosis. Bladder is unremarkable. Stomach/Bowel: Interval development of mildly hyperdense fluid throughout the abdomen in keeping with mild hemoperitoneum. This is of relatively low attenuation, however (16-18 Hounsfield units) suggesting  that this may be subacute in nature or reflect underlying anemia. The stomach, small bowel, and large bowel are unremarkable. Appendix normal. No free intraperitoneal gas. Lymphatic: No pathologic adenopathy within the abdomen and pelvis. Reproductive: Prostate is unremarkable. Other: No abdominal wall hernia Musculoskeletal: Degenerative changes are seen at L1-2. No acute bone abnormality. No lytic or blastic bone lesion. Review of the MIP images confirms the above findings. IMPRESSION: 1. 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 2. Interval development of mild hemoperitoneum throughout the abdomen. This is of relatively low attenuation, however, suggesting that this may be subacute in nature or reflect underlying anemia. 3. No evidence of thoracic or abdominal aortic aneurysm or dissection. 4. Changes of chronic pancreatitis. No superimposed acute peripancreatic inflammatory changes or fluid collections are identified. Emphysema (ICD10-J43.9). Electronically Signed: By: Helyn Numbers M.D. On: 05/20/2023 01:03   DG Chest Port 1 View  Result Date: 05/19/2023 CLINICAL DATA:  Chest and abdominal pain EXAM: PORTABLE CHEST 1 VIEW COMPARISON:  04/22/2023 FINDINGS: Single frontal view of the chest demonstrates a stable cardiac silhouette. Chronic elevation of the left hemidiaphragm. No acute airspace disease, effusion, or pneumothorax. No acute bony abnormality. IMPRESSION: 1. Stable chest, no acute process. Electronically Signed   By: Sharlet Salina M.D.   On: 05/19/2023 23:52    EKG: My personal interpretation of EKG shows: EKG showing sinus rhythm heart rate 68.    Assessment/Plan: Principal Problem:   Abdominal hematoma-hematoma of the splenic fossa Active Problems:   Status post splenectomy   SIRS (systemic inflammatory response syndrome) (HCC)   Abdominal pain   Hyperlipidemia   Essential hypertension   Alcohol use   History of chronic pancreatitis   Pain syndrome, chronic   GAD (generalized anxiety disorder)   Chronic anemia   Emphysema lung (HCC)    Assessment and Plan: Hematoma of the splenic fossa SIRS History of present splenectomy 04/21/2023 -Patient presenting with complaining of severe abdominal pain with radiation to bilateral shoulder and back and radiation to bilateral groin as well.  Patient was having chills and sweating as well.  Denies any fever. Patient is complaining about severe abdominal pain.  Required multiple doses of Dilaudid, and fentanyl in the ED. - Patient underwent recent splenectomy on 04/21/2023. - And presentation to ED patient found tachycardic heart rate was 102, borderline hypotensive which has been improved and O2 sat variable 77 to 97%. -Labs showing leukocytosis 20.  Stable hemoglobin 10.2 and hematocrit 32. -Patient is tachycardic, has leukocytosis, and CT abdomen pelvis possible source of infection.  Meets SIRS criteria. -CTA chest/abdomen/pelvis splenectomy via a chevron incision. In light of this, the collection  seen within the left upper quadrant represents a large hematoma within the splenic bed measuring 9.8 x 14.1 x 10.6 cm (volume = 770 cm^3) with a more hyperdense region centrally possibly representing sentinel clot or hemostatic packing material. There are punctate foci of gas within the hematoma at the superior margin though this is nonspecific and may simply represent gas related to open splenectomy. -General Surgery has been consulted.  Dr. Tilford Pillar recommended IR consult for aspiration of splenic fossa hematoma.  And need to send fluid for cell count, Gram stain and culture.  As there is concern for leukocytosis related to splenectomy versus infection. - Consulting IR for requesting for aspiration of the splenic fossa hematoma - Obtaining blood cultures. - Informed by pharmacy that patient has history of anaphylaxis with penicillin in the  past. - Initiating IV ciprofloxacin 400 mg twice daily and metronidazole 500 mg twice daily for 7 days. - Will follow-up with blood cultures, abdominal hematoma fluid cell count, Gram stain and culture result. - Continue to monitor fever curve and WBC. - At bedside patient seems very anxious.  Ordered Ativan 1 mg to give prior to abdominal hematoma aspiration procedure. -Being patient NPO. - Currently on maintenance fluid NS 50 cc/h (as also managing for hyponatremia).  Chronic normocytic anemia - Stable H&H 10.2 and 32.  MCV 89.  Baseline hemoglobin in between 11 to 14. -Continue to monitor CBC in the setting of abdominal splenic hematoma. -Checking H&H 3 times daily, goal hemoglobin above 7. - Checking type and screen and preparing to units of blood in case patient needs blood transfusion. -Checking anemia panel.  Persistent abdominal pain and spasm -Patient reported severe abdominal pain 10 out of 10 intensity.  In the ED patient received Dilaudid 1 mg x 2 and fentanyl 50 mcg still complaining about abdominal pain.   -Plan to continue Roxicodone 5 mg  every 4 hour as needed for moderate pain and Dilaudid 1 mg every 2 hour as needed for severe and breakthrough pain. -Continue Robaxin 3 times daily as needed.  Transaminitis - Slightly elevated AST 42.  Normal ALT 32 and bilirubin 0.08 - Continue to monitor hepatic panel.  Chronic pancreatitis -Patient is complaining about severe abdominal pain mostly on the left upper quadrant.  Lipase 120. - CT abdomen is of chronic pancreatitis.  No superimposed acute peripancreatic inflammatory change or fluid collection.  Hyponatremia - Serum sodium 129 and low chloride 96..  Patient baseline sodium around 131-132. -Hyponatremia secondary to poor oral solute intake. -Continue NS 50 cc/h for 1 day.  Continue to monitor serum sodium level 3 times daily.  Checking serum osmolarity, urine Osmo and urine sodium level   Emphysema - CTA chest abdomen pelvis showed emphysema. - In the ED patient O2 sat dropped to 77% at room air.  Currently O2 sat 97%.  I believe dropping of oxygen saturation in the setting of hypoventilation in the context of 6.  Abdominal pain - Continue to monitor pulse ox and continue supplemental oxygen as needed to keep O2 sat above 92%. - Continue Xopenex as needed for any wheezing or shortness of breath.  Chronic pain syndrome -Patient has history of chronic pain syndrome.  At home patient is on Robaxin, Zanaflex and Roxicodone. - Currently continuing Roxicodone and Robaxin.  Hyperlipidemia -Continue Tricor once daily.  Essential hypertension -In the ED patient blood pressure dropped to 99/44 however patient blood pressure is within good range. - Holding losartan in the setting of SIRS. - Continue hydralazine as needed.  Generalized anxiety disorder -Continue duloxetine 20 mg daily and Xanax 0.25 mg twice daily as needed for anxiety.  Chronic alcohol use - Patient reported he has been decreased drinking alcohol over the course of last 1 week and last drink was about 3 to 4  days ago. - Continue CIWA protocol with Ativan as needed - Continue folic acid, multivitamin and thiamine. - Continue cardiac monitoring.   DVT prophylaxis:  SCDs.  Deferring pharmacological prophylaxis as plan for intra-abdominal hematoma drainage today with IR consult. Code Status:  Full Code Diet: Currently n.p.o. for abdominal hematoma drainage later today.  Consulted interventional radiology regarding this. Family Communication: No family member at the bedside now. Disposition Plan: Pending clinical improvement. Consults: General Surgery and interventional radiology Admission status:   Inpatient, Step Down Unit  Severity of Illness: The appropriate patient status for this patient is INPATIENT. Inpatient status is judged to be reasonable and necessary in order to provide the required intensity of service to ensure the patient's safety. The patient's presenting symptoms, physical exam findings, and initial radiographic and laboratory data in the context of their chronic comorbidities is felt to place them at high risk for further clinical deterioration. Furthermore, it is not anticipated that the patient will be medically stable for discharge from the hospital within 2 midnights of admission.   * I certify that at the point of admission it is my clinical judgment that the patient will require inpatient hospital care spanning beyond 2 midnights from the point of admission due to high intensity of service, high risk for further deterioration and high frequency of surveillance required.Marland Kitchen    Tereasa Coop, MD [facility]  How to contact the [facility] Attending or Consulting provider 7A - 7P or covering provider during after hours 7P -7A, for this patient.  Check the care team in [facility] and look for a) attending/consulting [facility] provider listed and b) the [facility] team listed Log into [email or web address] and use [facility] universal password to access. If you do not have the password, please contact  the hospital operator. Locate the [facility] provider you are looking for under [facility] and page to a number that you can be directly reached. If you still have difficulty reaching the provider, please page the [facility] (Director on Call) for the Hospitalists listed on amion for assistance.  05/20/2023, 6:38 AM

## 2023-05-20 NOTE — ED Notes (Signed)
Pt refusing oral meds right now d/t reported nausea; explained to pt that he was just given something for nausea. Pt reports pain is now 8/10; notified pt that he can have pain meds again at 1002. Pt is also asking for nicotine patch. Will notify MD about patch.

## 2023-05-21 ENCOUNTER — Other Ambulatory Visit: Payer: Self-pay

## 2023-05-21 DIAGNOSIS — D649 Anemia, unspecified: Secondary | ICD-10-CM | POA: Diagnosis not present

## 2023-05-21 DIAGNOSIS — S301XXA Contusion of abdominal wall, initial encounter: Secondary | ICD-10-CM | POA: Diagnosis not present

## 2023-05-21 DIAGNOSIS — G894 Chronic pain syndrome: Secondary | ICD-10-CM | POA: Diagnosis not present

## 2023-05-21 DIAGNOSIS — I10 Essential (primary) hypertension: Secondary | ICD-10-CM | POA: Diagnosis not present

## 2023-05-21 LAB — CBC WITH DIFFERENTIAL/PLATELET
Abs Immature Granulocytes: 0.14 10*3/uL — ABNORMAL HIGH (ref 0.00–0.07)
Basophils Absolute: 0 10*3/uL (ref 0.0–0.1)
Basophils Relative: 0 %
Eosinophils Absolute: 0 10*3/uL (ref 0.0–0.5)
Eosinophils Relative: 0 %
HCT: 28.7 % — ABNORMAL LOW (ref 39.0–52.0)
Hemoglobin: 9 g/dL — ABNORMAL LOW (ref 13.0–17.0)
Immature Granulocytes: 1 %
Lymphocytes Relative: 8 %
Lymphs Abs: 1.6 10*3/uL (ref 0.7–4.0)
MCH: 27.6 pg (ref 26.0–34.0)
MCHC: 31.4 g/dL (ref 30.0–36.0)
MCV: 88 fL (ref 80.0–100.0)
Monocytes Absolute: 2.6 10*3/uL — ABNORMAL HIGH (ref 0.1–1.0)
Monocytes Relative: 12 %
Neutro Abs: 16.6 10*3/uL — ABNORMAL HIGH (ref 1.7–7.7)
Neutrophils Relative %: 79 %
Platelets: 391 10*3/uL (ref 150–400)
RBC: 3.26 MIL/uL — ABNORMAL LOW (ref 4.22–5.81)
RDW: 14.3 % (ref 11.5–15.5)
WBC: 21 10*3/uL — ABNORMAL HIGH (ref 4.0–10.5)
nRBC: 0 % (ref 0.0–0.2)

## 2023-05-21 LAB — BASIC METABOLIC PANEL
Anion gap: 6 (ref 5–15)
BUN: 18 mg/dL (ref 6–20)
CO2: 26 mmol/L (ref 22–32)
Calcium: 8.7 mg/dL — ABNORMAL LOW (ref 8.9–10.3)
Chloride: 97 mmol/L — ABNORMAL LOW (ref 98–111)
Creatinine, Ser: 0.6 mg/dL — ABNORMAL LOW (ref 0.61–1.24)
GFR, Estimated: >60 mL/min (ref >60)
Glucose, Bld: 127 mg/dL — ABNORMAL HIGH (ref 70–99)
Potassium: 4.2 mmol/L (ref 3.5–5.1)
Sodium: 129 mmol/L — ABNORMAL LOW (ref 135–145)

## 2023-05-21 LAB — GLUCOSE, CAPILLARY
Glucose-Capillary: 130 mg/dL — ABNORMAL HIGH (ref 70–99)
Glucose-Capillary: 137 mg/dL — ABNORMAL HIGH (ref 70–99)
Glucose-Capillary: 160 mg/dL — ABNORMAL HIGH (ref 70–99)

## 2023-05-21 LAB — MAGNESIUM: Magnesium: 1.9 mg/dL (ref 1.7–2.4)

## 2023-05-21 MED ORDER — BENZONATATE 100 MG PO CAPS
200.0000 mg | ORAL_CAPSULE | Freq: Three times a day (TID) | ORAL | Status: DC | PRN
  Administered 2023-05-21: 200 mg via ORAL
  Filled 2023-05-21: qty 2

## 2023-05-21 MED ORDER — INSULIN ASPART 100 UNIT/ML IJ SOLN
0.0000 [IU] | Freq: Three times a day (TID) | INTRAMUSCULAR | Status: DC
Start: 2023-05-21 — End: 2023-05-23

## 2023-05-21 NOTE — Progress Notes (Signed)
Requested received from [facility] to remove the LUQ drain.    Went to remove the drain, patient became nauseous after eating couple bites of his lunch. Patient asks drain to be removed later. RN notified that patient is in need of antiemetic meds.   After further discussion with [facility] team, decision was made to hold off drain removal.  Will follow cx result and decide the appropriate time for drain removal.   Please call IR for questions and concerns.   Lynann Bologna Quinnley Colasurdo PA-C 05/21/2023 2:46 PM

## 2023-05-21 NOTE — Progress Notes (Signed)
Subjective: Patient got a drain yesterday.  Tolerating CLD.    ROS: See above, otherwise other systems negative  Objective: Vital signs in last 24 hours: Temp:  [98 F (36.7 C)-98.8 F (37.1 C)] 98.5 F (36.9 C) (11/08 0800) Pulse Rate:  [74-95] 74 (11/08 0800) Resp:  [11-25] 18 (11/08 0800) BP: (102-134)/(60-105) 121/69 (11/08 0800) SpO2:  [89 %-100 %] 100 % (11/08 0359)    Intake/Output from previous day: 11/07 0701 - 11/08 0700 In: 834.1 [I.V.:237.5; IV Piggyback:596.6] Out: 80 [Drains:80] Intake/Output this shift: No intake/output data recorded.  PE: Abd: soft, tender in LUQ, JP with some thin old bloody output, incision from surgery well healed  Lab Results:  Recent Labs    05/20/23 1315 05/21/23 0355  WBC 26.0* 21.0*  HGB 10.9* 9.0*  HCT 34.9* 28.7*  PLT 428* 391   BMET Recent Labs    05/20/23 1315 05/21/23 0355  NA 127* 129*  K 4.3 4.2  CL 96* 97*  CO2 22 26  GLUCOSE 154* 127*  BUN 22* 18  CREATININE 0.92 0.60*  CALCIUM 8.6* 8.7*   PT/INR Recent Labs    05/19/23 2338  LABPROT 13.8  INR 1.0   CMP     Component Value Date/Time   NA 129 (L) 05/21/2023 0355   K 4.2 05/21/2023 0355   CL 97 (L) 05/21/2023 0355   CO2 26 05/21/2023 0355   GLUCOSE 127 (H) 05/21/2023 0355   BUN 18 05/21/2023 0355   CREATININE 0.60 (L) 05/21/2023 0355   CALCIUM 8.7 (L) 05/21/2023 0355   PROT 6.5 05/19/2023 2338   ALBUMIN 2.9 (L) 05/19/2023 2338   AST 42 (H) 05/19/2023 2338   ALT 32 05/19/2023 2338   ALKPHOS 93 05/19/2023 2338   BILITOT 0.8 05/19/2023 2338   GFRNONAA >60 05/21/2023 0355   GFRAA >60 09/10/2018 0401   Lipase     Component Value Date/Time   LIPASE 120 (H) 05/19/2023 2338       Studies/Results: CT GUIDED PERITONEAL/RETROPERITONEAL FLUID DRAIN BY PERC CATH  Result Date: 05/20/2023 INDICATION: 62 year old gentleman with left upper quadrant hematoma/fluid collection status post splenectomy presented to the ED with intractable  abdominal pain. IR consulted for drain placement. EXAM: CT-guided left upper quadrant drain placement TECHNIQUE: Multidetector CT imaging of the abdomen was performed following the standard protocol without IV contrast. RADIATION DOSE REDUCTION: This exam was performed according to the departmental dose-optimization program which includes automated exposure control, adjustment of the mA and/or kV according to patient size and/or use of iterative reconstruction technique. MEDICATIONS: The patient is currently admitted to the hospital and receiving intravenous antibiotics. The antibiotics were administered within an appropriate time frame prior to the initiation of the procedure. ANESTHESIA/SEDATION: Moderate (conscious) sedation was employed during this procedure. A total of Versed 4 mg and Fentanyl 100 mcg was administered intravenously by the radiology nurse. Total intra-service moderate Sedation Time: 22 minutes. The patient's level of consciousness and vital signs were monitored continuously by radiology nursing throughout the procedure under my direct supervision. COMPLICATIONS: None immediate. PROCEDURE: Informed written consent was obtained from the patient after a thorough discussion of the procedural risks, benefits and alternatives. All questions were addressed. Maximal Sterile Barrier Technique was utilized including caps, mask, sterile gowns, sterile gloves, sterile drape, hand hygiene and skin antiseptic. A timeout was performed prior to the initiation of the procedure. Patient positioned supine on the procedure table. The left upper quadrant skin prepped and draped in the usual  fashion. Following local administration, the left upper quadrant fluid collection was accessed with a 19 gauge Yueh needle utilizing CT guidance. Yueh needle was removed over 0.035 inch guidewire. Serial dilation was performed followed by placement of 12 [place] multipurpose pigtail drain. Approximately 400 mL of bloody fluid was  aspirated. Samples were sent for Gram stain and culture. IMPRESSION: Successful CT-guided insertion of 12 [place] multipurpose pigtail drain in symptomatic left upper quadrant post splenectomy hematoma. Electronically Signed   By: Acquanetta Belling M.D.   On: 05/20/2023 15:48   CT Angio Chest/Abd/Pel for Dissection W and/or W/WO  Addendum Date: 05/20/2023   ADDENDUM REPORT: 05/20/2023 02:02 ADDENDUM: Additional history was provided in that the patient has recently undergone splenectomy via a chevron incision. In light of this, the collection seen within the left upper quadrant represents a large hematoma within the splenic bed measuring 9.8 x 14.1 x 10.6 cm (volume = 770 cm^3) with a more hyperdense region centrally possibly representing sentinel clot or hemostatic packing material. There are punctate foci of gas within the hematoma at the superior margin though this is nonspecific and may simply represent gas related to open splenectomy. Super infection, however, is not excluded on this examination alone and correlation with the patient's clinical picture is recommended. If indicated, diagnostic sampling of the hematoma for microbial analysis could be considered. Electronically Signed   By: Helyn Numbers M.D.   On: 05/20/2023 02:02   Result Date: 05/20/2023 CLINICAL DATA:  Acute aortic syndrome, abdominal pain, diaphoresis. 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 Multidetector CT imaging through the chest, abdomen and pelvis was performed using the standard protocol during bolus administration of intravenous contrast. Multiplanar reconstructed images and MIPs were obtained and reviewed to evaluate the vascular anatomy. RADIATION DOSE REDUCTION: This exam was performed according to the departmental dose-optimization program which includes automated exposure control,  adjustment of the mA and/or kV according to patient size and/or use of iterative reconstruction technique. CONTRAST:  OMNIPAQUE IOHEXOL 350 MG/ML SOLN COMPARISON:  04/20/2023. FINDINGS: CTA CHEST FINDINGS Cardiovascular: Preferential opacification of the thoracic aorta. No evidence of thoracic aortic aneurysm or dissection. Normal heart size. No pericardial effusion. Mediastinum/Nodes: No enlarged mediastinal, hilar, or axillary lymph nodes. Thyroid gland, trachea, and esophagus demonstrate no significant findings. Lungs/Pleura: Mild emphysema. No confluent pulmonary infiltrate. No pneumothorax or pleural effusion. No central obstructing lesion. Musculoskeletal: No chest wall abnormality. No acute or significant osseous findings. Review of the MIP images confirms the above findings. CTA ABDOMEN AND PELVIS FINDINGS VASCULAR Aorta: Normal caliber aorta without aneurysm, dissection, vasculitis or significant stenosis. Celiac: Since the prior examination, the pseudoaneurysm within the splenic hilum has thrombosed. Additionally, there is markedly 2 decreased caliber of the splenic artery diffusely which may relate to interval near complete infarction of the spleen described below. The celiac axis itself is widely patent, as is the left gastric artery and common hepatic artery. No aneurysm or dissection. SMA: Patent without evidence of aneurysm, dissection, vasculitis or significant stenosis. Renals: Main renal arteries bilaterally are widely patent and demonstrate normal vascular morphology. Tiny bilateral accessory lower pole renal arteries are noted. IMA: Patent without evidence of aneurysm, dissection, vasculitis or significant stenosis. Inflow: Patent without evidence of aneurysm, dissection, vasculitis or significant stenosis. Veins: No obvious venous abnormality within the limitations of this arterial phase study. Review of the MIP images confirms the above findings. NON-VASCULAR Hepatobiliary: No  focal liver  abnormality is seen. No gallstones, gallbladder wall thickening, or biliary dilatation. Pancreas: Punctate calcifications within the pancreatic tail, head, and uncinate process are identified in keeping with changes of chronic pancreatitis. No superimposed acute peripancreatic inflammatory changes or fluid collections are identified. Pancreatic duct is not dilated. 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 Small amount of residual enhancing tissue is seen within the splenic hilum. Adrenals/Urinary Tract: Adrenal glands are unremarkable. Kidneys are normal, without renal calculi, focal lesion, or hydronephrosis. Bladder is unremarkable. Stomach/Bowel: Interval development of mildly hyperdense fluid throughout the abdomen in keeping with mild hemoperitoneum. This is of relatively low attenuation, however (16-18 Hounsfield units) suggesting that this may be subacute in nature or reflect underlying anemia. The stomach, small bowel, and large bowel are unremarkable. Appendix normal. No free intraperitoneal gas. Lymphatic: No pathologic adenopathy within the abdomen and pelvis. Reproductive: Prostate is unremarkable. Other: No abdominal wall hernia Musculoskeletal: Degenerative changes are seen at L1-2. No acute bone abnormality. No lytic or blastic bone lesion. Review of the MIP images confirms the above findings. IMPRESSION: 1. 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 2. Interval development of mild hemoperitoneum throughout the abdomen. This is of relatively low attenuation, however, suggesting that this may be subacute in nature or reflect underlying anemia. 3. No evidence of thoracic or abdominal aortic aneurysm or dissection. 4. Changes of chronic pancreatitis. No superimposed acute peripancreatic inflammatory changes or fluid collections are identified. Emphysema (ICD10-J43.9). Electronically Signed: By: Helyn Numbers M.D.  On: 05/20/2023 01:03   DG Chest Port 1 View  Result Date: 05/19/2023 CLINICAL DATA:  Chest and abdominal pain EXAM: PORTABLE CHEST 1 VIEW COMPARISON:  04/22/2023 FINDINGS: Single frontal view of the chest demonstrates a stable cardiac silhouette. Chronic elevation of the left hemidiaphragm. No acute airspace disease, effusion, or pneumothorax. No acute bony abnormality. IMPRESSION: 1. Stable chest, no acute process. Electronically Signed   By: Sharlet Salina M.D.   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 Luisa Hart on 04/21/23. Now with hematoma, ? Infection -s/p IR drain yesterday -doesn't appear infected by looks of fluid -WBC improving, but still may be elevated secondary to asplenic state -await cultures -if negative, would likely recommend removing drain prior to discharge -carb mod diet  FEN - carb mod diet VTE - ok for DVT prophylaxis from our standpoint ID - cipro/flagyl  DM Chronic pain - may  need outpatient pain doctor ETOH abuse  I reviewed Consultant IR notes, hospitalist notes, last 24 h vitals and pain scores, last 48 h intake and output, last 24 h labs and trends, and last 24 h imaging results.   LOS: 1 day    Letha Cape , [facility] 05/21/2023, 9:02 AM Please see Amion for pager number during day hours 7:00am-4:30pm or 7:00am -  11:30am on weekends

## 2023-05-21 NOTE — Progress Notes (Signed)
PROGRESS NOTE        PATIENT DETAILS Name: Samuel Abbott Age: 62 y.o. Sex: male Date of Birth: Feb 14, 1961 Admit Date: 05/19/2023 Admitting Physician Tereasa Coop, MD FAO:[id number], Suzan Slick, PA  Brief Summary: Patient is a 61 y.o.  male with history of recent splenectomy for a spontaneous splenic hematoma-presenting with subjective fever/abdominal pain-CT abdomen/pelvis showed a large hematoma in the splenic bed-evaluated by IR/CCS-underwent CT-guided drain placement on 11/7.    Significant events: 11/6>> admit to [facility]  Significant studies: 11/7>> CT angiography chest/abdomen/pelvis: Large hematoma in the splenic bed.  Significant microbiology data: 11/7>> blood culture: No growth 11/7>> hematoma splenic bed culture: No growth  Procedures: 11/7>> CT-guided drain placement-left upper quadrant hematoma  Consults: General surgery IR  Subjective: Lying comfortably in bed-denies any chest pain or shortness of breath.  Pain currently well-controlled with IV Dilaudid.  Objective: Vitals: Blood pressure 121/69, pulse 74, temperature 98.5 F (36.9 C), temperature source Oral, resp. rate 18, weight 70.3 kg, SpO2 100%.   Exam: Gen Exam:Alert awake-not in any distress HEENT:atraumatic, normocephalic Chest: B/L clear to auscultation anteriorly CVS:S1S2 regular Abdomen:soft mildly tender in the left upper quadrant area. 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 Follow CBC.    HLD Tricor  HTN BP stable Losartan on hold-resume on discharge  DM-2 (A1c 7.0 on 10/9) SSI while inpatient Resume metformin on discharge  Chronic pancreatitis Exam stable-on narcotics for pain.  Generalized anxiety disorder Appears stable-pain well-controlled Continue Cymbalta As needed Xanax  Chronic pain syndrome Reviewed prior notes-has exhibited narcotic seeking behavior in the past For now continue with current dosing of narcotics Patient aware that he will be only provided a few day supply of oral narcotics on discharge-and for further refills he will need to follow-up with his primary care practitioner.  Alcohol use No withdrawal symptoms currently Ativan per CIWA protocol  BMI: Estimated body mass index is 21.02 kg/m as calculated from the following:   Height as of 04/21/23: 6' (1.829 m).   Weight as of this encounter: 70.3 kg.   Code status:   Code Status: Full Code   DVT Prophylaxis: SCDs Start: 05/20/23 0551   Family Communication: None at bedside   Disposition Plan: Status is: Inpatient Remains inpatient appropriate because: Severity of illness  Planned Discharge Destination:Home   Diet: Diet Order             Diet Carb Modified Fluid consistency: Thin  Diet effective now                     Antimicrobial agents: Anti-infectives (From admission, onward)    Start     Dose/Rate Route Frequency Ordered Stop    05/20/23 0630  ciprofloxacin (CIPRO) IVPB 400 mg        400 mg 200 mL/hr over 60 Minutes Intravenous Every 12 hours 05/20/23 0617 05/27/23 0629   05/20/23 0630  metroNIDAZOLE (FLAGYL) IVPB 500 mg        500 mg 100 mL/hr over 60 Minutes Intravenous Every 12 hours 05/20/23 0617 05/23/23 1829        MEDICATIONS: Scheduled Meds:  sodium chloride   Intravenous Once   DULoxetine  20 mg Oral Daily   folic acid  1 mg Oral Daily   multivitamin with minerals  1 tablet Oral Daily   nicotine  21 mg Transdermal Daily   sodium chloride flush  3 mL Intravenous Q12H   thiamine  100 mg Oral Daily   Continuous Infusions:  ciprofloxacin 400 mg (05/21/23 0650)   metronidazole 500 mg (05/21/23 0531)   PRN Meds:.acetaminophen **OR** acetaminophen, ALPRAZolam, hydrALAZINE, HYDROmorphone (DILAUDID) injection, levalbuterol, methocarbamol, ondansetron **OR** ondansetron (ZOFRAN) IV, oxyCODONE, prochlorperazine, sodium chloride flush   I have personally reviewed following labs and imaging studies  LABORATORY DATA: CBC: Recent Labs  Lab 05/19/23 2338 05/19/23 2340 05/20/23 1315 05/21/23 0355  WBC 20.1*  --  26.0* 21.0*  NEUTROABS 15.8*  --   --  16.6*  HGB 10.2* 11.6* 10.9* 9.0*  HCT 32.7* 34.0* 34.9* 28.7*  MCV 89.6  --  89.0 88.0  PLT 455*  --  428* 391    Basic Metabolic Panel: Recent Labs  Lab 05/19/23 2338 05/19/23 2340 05/20/23 1315 05/21/23 0355  NA 129* 128* 127* 129*  K 3.8 3.9 4.3 4.2  CL 96* 96* 96* 97*  CO2 21*  --  22 26  GLUCOSE 186* 183* 154* 127*  BUN 18 18 22* 18  CREATININE 0.74 0.70 0.92 0.60*  CALCIUM 8.9  --  8.6* 8.7*  MG  --   --   --  1.9    GFR: Estimated Creatinine Clearance: 97.6 mL/min (A) (by C-G formula based on SCr of 0.6 mg/dL (L)).  Liver Function Tests: Recent Labs  Lab 05/19/23 2338  AST 42*  ALT 32  ALKPHOS 93  BILITOT 0.8  PROT 6.5  ALBUMIN 2.9*   Recent Labs  Lab 05/19/23 2338  LIPASE 120*   No results for input(s):  "AMMONIA" in the last 168 hours.  Coagulation Profile: Recent Labs  Lab 05/19/23 2338  INR 1.0    Cardiac Enzymes: No results for input(s): "CKTOTAL", "CKMB", "CKMBINDEX", "TROPONINI" in the last 168 hours.  BNP (last 3 results) No results for input(s): "PROBNP" in the last 8760 hours.  Lipid Profile: No results for input(s): "CHOL", "HDL", "LDLCALC", "TRIG", "CHOLHDL", "LDLDIRECT" in the last 72 hours.  Thyroid Function Tests: No results for input(s): "TSH", "T4TOTAL", "FREET4", "T3FREE", "THYROIDAB" in the last 72 hours.  Anemia Panel: Recent Labs    05/20/23 0810 05/20/23 1309 05/20/23 1315  VITAMINB12  --  483  --   FOLATE 15.1 14.2  --   FERRITIN  --   --  503*  TIBC  --   --  263  IRON  --   --  15*  RETICCTPCT  --  1.6  --     Urine analysis:    Component Value Date/Time   COLORURINE YELLOW 12/03/2022 1700   APPEARANCEUR CLEAR 12/03/2022 1700   LABSPEC >1.046 (H) 12/03/2022 1700   PHURINE 6.5 12/03/2022 1700   GLUCOSEU NEGATIVE 12/03/2022 1700   HGBUR NEGATIVE 12/03/2022 1700   BILIRUBINUR NEGATIVE 12/03/2022 1700   KETONESUR 15 (A) 12/03/2022 1700   PROTEINUR TRACE (A) 12/03/2022 1700   NITRITE NEGATIVE 12/03/2022 1700   LEUKOCYTESUR NEGATIVE 12/03/2022 1700    Sepsis Labs: Lactic Acid, Venous    Component Value Date/Time   LATICACIDVEN 0.7 04/21/2023 0354    MICROBIOLOGY: Recent Results (from the past 240 hour(s))  Culture, blood (Routine X 2) w Reflex to ID Panel     Status: None (Preliminary result)   Collection Time: 05/20/23  6:37 AM   Specimen: BLOOD RIGHT ARM  Result Value Ref Range Status   Specimen Description BLOOD RIGHT ARM  Final   Special Requests   Final    BOTTLES DRAWN AEROBIC AND ANAEROBIC Blood Culture results may not be optimal due to an excessive volume of blood received in culture bottles   Culture   Final    NO GROWTH 1 DAY Performed at [facility], [address] [place], [place] [address]    Report Status  PENDING  Incomplete  Culture, blood (Routine X 2) w Reflex to ID Panel     Status: None (Preliminary result)   Collection Time: 05/20/23  6:37 AM   Specimen: BLOOD RIGHT HAND  Result Value Ref Range Status   Specimen Description BLOOD RIGHT HAND  Final   Special Requests   Final    BOTTLES DRAWN AEROBIC AND ANAEROBIC Blood Culture results may not be optimal due to an excessive volume of blood received in culture bottles   Culture   Final    NO GROWTH 1 DAY Performed at [facility], [address] [place], [place] [address]    Report Status PENDING  Incomplete  Aerobic/Anaerobic Culture w Gram Stain (surgical/deep wound)     Status: None (Preliminary result)   Collection Time: 05/20/23  2:58 PM   Specimen: Abdomen; Abscess  Result Value Ref Range Status   Specimen Description ABDOMEN ABSCESS  Final   Special Requests NONE  Final   Gram Stain NO WBC SEEN NO ORGANISMS SEEN   Final   Culture   Final    NO GROWTH < 24 HOURS Performed at [facility], [address] [place], [place] [address]    Report Status PENDING  Incomplete    RADIOLOGY STUDIES/RESULTS: CT GUIDED PERITONEAL/RETROPERITONEAL FLUID DRAIN BY PERC CATH  Result Date: 05/20/2023 INDICATION: 62 year old gentleman with left upper quadrant hematoma/fluid collection status post splenectomy presented to the ED with intractable abdominal pain. IR consulted for drain placement. EXAM: CT-guided left upper quadrant drain placement TECHNIQUE: Multidetector CT imaging of the abdomen was performed following the standard protocol without IV contrast. RADIATION DOSE REDUCTION: This exam was performed according to the departmental dose-optimization program which includes automated exposure control, adjustment of the mA and/or kV according to patient size and/or use of iterative reconstruction technique. MEDICATIONS: The patient is currently admitted to the hospital and receiving intravenous antibiotics. The antibiotics were  administered within an appropriate time frame prior to the initiation of the procedure. ANESTHESIA/SEDATION: Moderate (conscious) sedation was employed during this procedure. A total of Versed 4  mg and Fentanyl 100 mcg was administered intravenously by the radiology nurse. Total intra-service moderate Sedation Time: 22 minutes. The patient's level of consciousness and vital signs were monitored continuously by radiology nursing throughout the procedure under my direct supervision. COMPLICATIONS: None immediate. PROCEDURE: Informed written consent was obtained from the patient after a thorough discussion of the procedural risks, benefits and alternatives. All questions were addressed. Maximal Sterile Barrier Technique was utilized including caps, mask, sterile gowns, sterile gloves, sterile drape, hand hygiene and skin antiseptic. A timeout was performed prior to the initiation of the procedure. Patient positioned supine on the procedure table. The left upper quadrant skin prepped and draped in the usual fashion. Following local administration, the left upper quadrant fluid collection was accessed with a 19 gauge Yueh needle utilizing CT guidance. Yueh needle was removed over 0.035 inch guidewire. Serial dilation was performed followed by placement of 12 [place] multipurpose pigtail drain. Approximately 400 mL of bloody fluid was aspirated. Samples were sent for Gram stain and culture. IMPRESSION: Successful CT-guided insertion of 12 French multipurpose pigtail drain in symptomatic left upper quadrant post splenectomy hematoma. Electronically Signed   By: Acquanetta Belling M.D.   On: 05/20/2023 15:48   CT Angio Chest/Abd/Pel for Dissection W and/or W/WO  Addendum Date: 05/20/2023   ADDENDUM REPORT: 05/20/2023 02:02 ADDENDUM: Additional history was provided in that the patient has recently undergone splenectomy via a chevron incision. In light of this, the collection seen within the left upper quadrant represents a  large hematoma within the splenic bed measuring 9.8 x 14.1 x 10.6 cm (volume = 770 cm^3) with a more hyperdense region centrally possibly representing sentinel clot or hemostatic packing material. There are punctate foci of gas within the hematoma at the superior margin though this is nonspecific and may simply represent gas related to open splenectomy. Super infection, however, is not excluded on this examination alone and correlation with the patient's clinical picture is recommended. If indicated, diagnostic sampling of the hematoma for microbial analysis could be considered. Electronically Signed   By: Helyn Numbers M.D.   On: 05/20/2023 02:02   Result Date: 05/20/2023 CLINICAL DATA:  Acute aortic syndrome, abdominal pain, diaphoresis. 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 Multidetector CT imaging through the chest, abdomen and pelvis was performed using the standard protocol during bolus administration of intravenous contrast. Multiplanar reconstructed images and MIPs were obtained and reviewed to evaluate the vascular anatomy. RADIATION DOSE REDUCTION: This exam was performed according to the departmental dose-optimization program which includes automated exposure control, adjustment of the mA and/or kV according to patient size and/or use of iterative reconstruction technique. CONTRAST:  OMNIPAQUE IOHEXOL 350 MG/ML SOLN COMPARISON:  04/20/2023. FINDINGS: CTA CHEST FINDINGS Cardiovascular: Preferential opacification of the thoracic aorta. No evidence of thoracic aortic aneurysm or dissection. Normal heart size. No pericardial effusion. Mediastinum/Nodes: No enlarged mediastinal, hilar, or axillary lymph nodes. Thyroid gland, trachea, and esophagus demonstrate no significant findings. Lungs/Pleura: Mild emphysema. No confluent pulmonary infiltrate. No  pneumothorax or pleural effusion. No central obstructing lesion. Musculoskeletal: No chest wall abnormality. No acute or significant osseous findings. Review of the MIP images confirms the above findings. CTA ABDOMEN AND PELVIS FINDINGS VASCULAR Aorta: Normal caliber aorta without aneurysm, dissection, vasculitis or significant stenosis. Celiac: Since the prior examination, the pseudoaneurysm within the splenic hilum has thrombosed. Additionally, there is markedly 2 decreased caliber of the splenic  artery diffusely which may relate to interval near complete infarction of the spleen described below. The celiac axis itself is widely patent, as is the left gastric artery and common hepatic artery. No aneurysm or dissection. SMA: Patent without evidence of aneurysm, dissection, vasculitis or significant stenosis. Renals: Main renal arteries bilaterally are widely patent and demonstrate normal vascular morphology. Tiny bilateral accessory lower pole renal arteries are noted. IMA: Patent without evidence of aneurysm, dissection, vasculitis or significant stenosis. Inflow: Patent without evidence of aneurysm, dissection, vasculitis or significant stenosis. Veins: No obvious venous abnormality within the limitations of this arterial phase study. Review of the MIP images confirms the above findings. NON-VASCULAR Hepatobiliary: No focal liver abnormality is seen. No gallstones, gallbladder wall thickening, or biliary dilatation. Pancreas: Punctate calcifications within the pancreatic tail, head, and uncinate process are identified in keeping with changes of chronic pancreatitis. No superimposed acute peripancreatic inflammatory changes or fluid collections are identified. Pancreatic duct is not dilated. 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 Small amount of residual enhancing tissue is seen within the splenic hilum. Adrenals/Urinary Tract: Adrenal glands are unremarkable. Kidneys are normal, without renal  calculi, focal lesion, or hydronephrosis. Bladder is unremarkable. Stomach/Bowel: Interval development of mildly hyperdense fluid throughout the abdomen in keeping with mild hemoperitoneum. This is of relatively low attenuation, however (16-18 Hounsfield units) suggesting that this may be subacute in nature or reflect underlying anemia. The stomach, small bowel, and large bowel are unremarkable. Appendix normal. No free intraperitoneal gas. Lymphatic: No pathologic adenopathy within the abdomen and pelvis. Reproductive: Prostate is unremarkable. Other: No abdominal wall hernia Musculoskeletal: Degenerative changes are seen at L1-2. No acute bone abnormality. No lytic or blastic bone lesion. Review of the MIP images confirms the above findings. IMPRESSION: 1. 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 2. Interval development of mild hemoperitoneum throughout the abdomen. This is of relatively low attenuation, however, suggesting that this may be subacute in nature or reflect underlying anemia. 3. No evidence of thoracic or abdominal aortic aneurysm or dissection. 4. Changes of chronic pancreatitis. No superimposed acute peripancreatic inflammatory changes or fluid collections are identified. Emphysema (ICD10-J43.9). Electronically Signed: By: Helyn Numbers M.D. On: 05/20/2023 01:03   DG Chest Port 1 View  Result Date: 05/19/2023 CLINICAL DATA:  Chest and abdominal pain EXAM: PORTABLE CHEST 1 VIEW COMPARISON:  04/22/2023 FINDINGS: Single frontal view of the chest demonstrates a stable cardiac silhouette. Chronic elevation of the left hemidiaphragm. No acute airspace disease, effusion, or pneumothorax. No acute bony abnormality. IMPRESSION: 1. Stable chest, no acute process. Electronically Signed   By: Sharlet Salina M.D.   On: 05/19/2023 23:52     LOS: 1 day   Jeoffrey Massed, MD  [facility]    To contact the attending  provider between 7A-7P or the covering provider during after hours 7P-7A, please log into the web site [email or web address] and access using universal [place] password for that web site. If you do not have the password, please call the hospital operator.  05/21/2023, 11:05 AM

## 2023-05-21 NOTE — Discharge Instructions (Signed)
Interventional Radiology Percutaneous Abscess Drain Placement After Care  This sheet gives you information about how to care for yourself after your procedure. Your health care provider may also give you more specific instructions. Your drain was placed by an interventional radiologist with [facility]. If you have questions or concerns, contact [facility] at [phone]. What is a percutaneous drain? A drain is a small plastic tube (catheter) that goes into the fluid collection in your body through your skin. How long will I need the drain? How long the drain needs to stay in is determined by where the drain is, how much comes out of the drain each day and if you are having any other surgical procedures. Interventional radiology will determine when it is time to remove the drain. It is important to follow up as directed so that the drain can be removed as soon as it is safe to do so. What can I expect after the procedure? After the procedure, it is common to have: A small amount of bruising and discomfort in the area where the drainage tube (catheter) was placed. Sleepiness and fatigue. This should go away after the medicines you were given have worn off. Follow these instructions at home: Insertion site care Check your insertion site when you change the bandage. Check for: More redness, swelling, or pain. More fluid or blood. Warmth. Pus or a bad smell. When caring for your insertion site: Wash your hands with soap and water for at least 20 seconds before and after you change your bandage (dressing). If soap and water are not available, use hand sanitizer. You do not need to change your dressing everyday if it is clean and dry. Change your dressing every 3 days or as needed when it is soiled, wet or becoming dislodged. You will need to change your dressing each time you shower. Leave stitches (sutures), skin glue, or adhesive strips in place. These skin closures may need  to stay in place for 2 weeks or longer. If adhesive strip edges start to loosen and curl up, you may trim the loose edges. Do not remove adhesive strips completely unless your health care provider tells you to do so.  Catheter care Flush the catheter once per day with 5 mL of 0.9% normal saline unless you are told otherwise by your healthcare provider. This helps to prevent clogs in the catheter. To disconnect the drain, turn the clear plastic tube to the left. Attach the saline syringe by placing it on the white end of the drain and turning gently to the right. Once attached gently push the plunger to the 5 mL mark. After you are done flushing, disconnect the syringe by turning to the left and reattach your drainage container    If you have a bulb please be sure the bulb is charged after reconnecting it - to do this pinch the bulb between your thumb and first finger and close the stopper located on the top of the bulb.   Check for fluid leaking from around your catheter (instead of fluid draining through your catheter). This may be a sign that the drain is no longer working correctly. Write down the following information every time you empty your bag: The date and time. The amount of drainage. Activity Rest at home for 1-2 days after your procedure. For the first 48 hours do not lift anything more than 10 lbs (about a gallon of milk). You may perform moderate activities/exercise. Please avoid strenuous activities during this  time. Avoid any activities which may pull on your drain as this can cause your drain to become dislodged. If you were given a sedative during the procedure, it can affect you for several hours. Do not drive or operate machinery until your health care provider says that it is safe. General instructions For mild pain take over-the-counter medications as needed for pain such as Tylenol or Advil. If you are experiencing severe pain please call our office as this may indicate an  issue with your drain.  If you were prescribed an antibiotic medicine, take it as told by your health care provider. Do not stop using the antibiotic even if you start to feel better. You may shower 24 hours after the drain is placed. To do this cover the insertion site with a water tight material such as saran wrap and seal the edges with tape, you may also purchase waterproof dressings at your local drug store. Shower as usual and then remove the water tight dressing and any gauze/tape underneath it once you have exited the shower and dried off. Allow the area to air dry or pat dry with a clean towel. Once the skin is completely dry place a new gauze dressing. It is important to keep the site dry at all times to prevent infection. Do not submerge the drain - this means you cannot take baths, swim, use a hot tub, etc. until the drain is removed.  Do not use any products that contain nicotine or tobacco, such as cigarettes, e-cigarettes, and chewing tobacco. If you need help quitting, ask your health care provider. Keep all follow-up visits as told by your health care provider. This is important. Contact a health care provider if: You have less than 10 mL of drainage a day for 2-3 days in a row, or as directed by your health care provider. You have any of these signs of infection: More redness, swelling, or pain around your incision area. More fluid or blood coming from your incision area. Warmth coming from your incision area. Pus or a bad smell coming from your incision area. You have fluid leaking from around your catheter (instead of through your catheter). You are unable to flush the drain. You have a fever or chills. You have pain that does not get better with medicine. You have not been contacted to schedule a drain follow up appointment within 10 days of discharge from the hospital. Please call [facility] at [phone] with any questions or concerns. Get help right away  if: Your catheter comes out. You suddenly stop having drainage from your catheter. You suddenly have blood in the fluid that is draining from your catheter. You become dizzy or you faint. You develop a rash. You have nausea or vomiting. You have difficulty breathing or you feel short of breath. You develop chest pain. You have problems with your speech or vision. You have trouble balancing or moving your arms or legs. Summary It is common to have a small amount of bruising and discomfort in the area where the drainage tube (catheter) was placed. You may also have minor discomfort with movement while the drain is in place. Flush the drain once per day with 5 mL of 0.9% normal saline (unless you were told otherwise by your healthcare provider).  Record the amount of drainage from the bag every time you empty it. Change the dressing every 3 days or earlier if soiled/wet. Keep the skin dry under the dressing. You may shower with  the drain in place. Do not submerge the drain (no baths, swimming, hot tubs, etc.). Contact [facility] at [phone] if you have more redness, swelling, or pain around your incision area or if you have pain that does not get better with medicine. This information is not intended to replace advice given to you by your health care provider. Make sure you discuss any questions you have with your health care provider. Document Revised: 10/02/2021 Document Reviewed: 06/24/2019 [employer] Patient Education  2023 [employer]    Interventional Radiology Drain Record Empty your drain at least once per day. You may empty it as often as needed. Use this form to write down the amount of fluid that has collected in the drainage container. Bring this form with you to your follow-up visits. Please call [facility] at [phone] with any questions or concerns prior to your appointment. Drain #1 location: ___________________ Date __________ Time __________  Amount __________ Date __________ Time __________ Amount __________ Date __________ Time __________ Amount __________ Date __________ Time __________ Amount __________ Date __________ Time __________ Amount __________ Date __________ Time __________ Amount __________ Date __________ Time __________ Amount __________ Date __________ Time __________ Amount __________ Date __________ Time __________ Amount __________ Date __________ Time __________ Amount __________ Date __________ Time __________ Amount __________ Date __________ Time __________ Amount __________ Date __________ Time __________ Amount __________ Date __________ Time __________ Amount __________     [facility] #2 location: ___________________ Date __________ Time __________ Amount __________ Date __________ Time __________ Amount __________ Date __________ Time __________ Amount __________ Date __________ Time __________ Amount __________ Date __________ Time __________ Amount __________ Date __________ Time __________ Amount __________ Date __________ Time __________ Amount __________ Date __________ Time __________ Amount __________ Date __________ Time __________ Amount __________ Date __________ Time __________ Amount __________ Date __________ Time __________ Amount __________ Date __________ Time __________ Amount __________ Date __________ Time __________ Amount __________ Date __________ Time __________ Amount __________

## 2023-05-21 NOTE — Progress Notes (Addendum)
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 Bryn Gulling on 11/7   Subjective:  Patient sitting in a recliner, NAD.  Abd pain is better today, may go home tomorrow. Drain care and f/u discussed with the patient in detail.   Allergies: Amoxil [amoxicillin], Penicillins, and Motrin [ibuprofen]  Medications: Prior to Admission medications   Medication Sig Start Date End Date Taking? Authorizing Provider  acetaminophen (TYLENOL) 500 MG tablet Take 2 tablets (1,000 mg total) by mouth every 6 (six) hours as needed for mild pain, moderate pain, fever or headache. 04/23/23  Yes Barnetta Chapel, PA-C  ALPRAZolam Prudy Feeler) 0.25 MG tablet Take 1 tablet by mouth 2 (two) times daily as needed for anxiety.   Yes [provider]  DULoxetine (CYMBALTA) 20 MG capsule Take 20 mg by mouth daily. For back pain 01/13/21  Yes [provider]  losartan (COZAAR) 25 MG tablet Take 25 mg by mouth daily. 09/16/20  Yes [provider]  metFORMIN (GLUCOPHAGE) 500 MG tablet Take 1 tablet (500 mg total) by mouth daily with breakfast. Patient taking differently: Take 250 mg by mouth 2 (two) times daily. 01/24/21 06/19/23 Yes Maness, Loistine Chance, MD  methocarbamol (ROBAXIN) 500 MG tablet Take 1 tablet (500 mg total) by mouth every 8 (eight) hours as needed for muscle spasms. 04/23/23  Yes Barnetta Chapel, PA-C  Multiple Vitamin (MULTIVITAMIN) tablet Take 1 tablet by mouth daily.   Yes [provider]  ondansetron (ZOFRAN) 4 MG tablet Take 1 tablet (4 mg total) by mouth every 8 (eight) hours as needed for nausea or vomiting. 04/23/23  Yes Leroy Sea, MD  oxyCODONE (OXY IR/ROXICODONE) 5 MG immediate release tablet Take 5 mg by mouth at bedtime as needed for moderate pain (pain score 4-6).   Yes [provider]   polyethylene glycol powder (GLYCOLAX/MIRALAX) 17 GM/SCOOP powder Take 17 g by mouth daily. 04/23/23  Yes Leroy Sea, MD  predniSONE (DELTASONE) 20 MG tablet Take 20 mg by mouth daily. 05/10/23  Yes [provider]  senna-docusate (SENOKOT-S) 8.6-50 MG tablet Take 2 tablets by mouth at bedtime as needed for mild constipation. 04/23/23  Yes Leroy Sea, MD  tiZANidine (ZANAFLEX) 4 MG tablet Take 4 mg by mouth every 8 (eight) hours as needed for muscle spasms. 05/10/23  Yes [provider]  fenofibrate (TRICOR) 145 MG tablet Take 1 tablet by mouth daily. Patient not taking: Reported on 05/20/2023    [provider]     Vital Signs: BP 121/69 (BP Location: Right Arm)   Pulse 74   Temp 98.5 F (36.9 C) (Oral)   Resp 18   Wt 155 lb (70.3 kg)   SpO2 100%   BMI 21.02 kg/m   Physical Exam Vitals reviewed.  Constitutional:      General: He is not in acute distress.    Appearance: He is not ill-appearing.  HENT:     Head: Normocephalic and atraumatic.  Pulmonary:     Effort: Pulmonary effort is normal.  Abdominal:     General: Abdomen is flat.     Comments: Positive LLQ/upper lateral drain to a suction bulb. Site is unremarkable with no erythema, edema, tenderness, bleeding or drainage. Suture and stat lock in place. Dressing is clean, dry, and intact. ~15 ml of  bloody fluid noted in the  bulb. Drain flushed well, does not aspirate.   Neurological:     Mental Status: He is alert.  Psychiatric:        Mood and Affect: Mood normal.        Behavior: Behavior normal.     Imaging: CT GUIDED PERITONEAL/RETROPERITONEAL FLUID DRAIN BY PERC CATH  Result Date: 05/20/2023 INDICATION: 62 year old gentleman with left upper quadrant hematoma/fluid collection status post splenectomy presented to the ED with intractable abdominal pain. IR consulted for drain placement. EXAM: CT-guided left upper quadrant drain placement TECHNIQUE: Multidetector CT imaging of  the abdomen was performed following the standard protocol without IV contrast. RADIATION DOSE REDUCTION: This exam was performed according to the departmental dose-optimization program which includes automated exposure control, adjustment of the mA and/or kV according to patient size and/or use of iterative reconstruction technique. MEDICATIONS: The patient is currently admitted to the hospital and receiving intravenous antibiotics. The antibiotics were administered within an appropriate time frame prior to the initiation of the procedure. ANESTHESIA/SEDATION: Moderate (conscious) sedation was employed during this procedure. A total of Versed 4 mg and Fentanyl 100 mcg was administered intravenously by the radiology nurse. Total intra-service moderate Sedation Time: 22 minutes. The patient's level of consciousness and vital signs were monitored continuously by radiology nursing throughout the procedure under my direct supervision. COMPLICATIONS: None immediate. PROCEDURE: Informed written consent was obtained from the patient after a thorough discussion of the procedural risks, benefits and alternatives. All questions were addressed. Maximal Sterile Barrier Technique was utilized including caps, mask, sterile gowns, sterile gloves, sterile drape, hand hygiene and skin antiseptic. A timeout was performed prior to the initiation of the procedure. Patient positioned supine on the procedure table. The left upper quadrant skin prepped and draped in the usual fashion. Following local administration, the left upper quadrant fluid collection was accessed with a 19 gauge Yueh needle utilizing CT guidance. Yueh needle was removed over 0.035 inch guidewire. Serial dilation was performed followed by placement of 12 [place] multipurpose pigtail drain. Approximately 400 mL of bloody fluid was aspirated. Samples were sent for Gram stain and culture. IMPRESSION: Successful CT-guided insertion of 12 French multipurpose pigtail drain  in symptomatic left upper quadrant post splenectomy hematoma. Electronically Signed   By: Acquanetta Belling M.D.   On: 05/20/2023 15:48   CT Angio Chest/Abd/Pel for Dissection W and/or W/WO  Addendum Date: 05/20/2023   ADDENDUM REPORT: 05/20/2023 02:02 ADDENDUM: Additional history was provided in that the patient has recently undergone splenectomy via a chevron incision. In light of this, the collection seen within the left upper quadrant represents a large hematoma within the splenic bed measuring 9.8 x 14.1 x 10.6 cm (volume = 770 cm^3) with a more hyperdense region centrally possibly representing sentinel clot or hemostatic packing material. There are punctate foci of gas within the hematoma at the superior margin though this is nonspecific and may simply represent gas related to open splenectomy. Super infection, however, is not excluded on this examination alone and correlation with the patient's clinical picture is recommended. If indicated, diagnostic sampling of the hematoma for microbial analysis could be considered. Electronically Signed   By: Helyn Numbers M.D.   On: 05/20/2023 02:02   Result Date: 05/20/2023 CLINICAL DATA:  Acute aortic syndrome, abdominal pain, diaphoresis. 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 Multidetector CT imaging through the chest, abdomen and pelvis was performed using the standard  protocol during bolus administration of intravenous contrast. Multiplanar reconstructed images and MIPs were obtained and reviewed to evaluate the vascular anatomy. RADIATION DOSE REDUCTION: This exam was performed according to the departmental dose-optimization program which includes automated exposure control, adjustment of the mA and/or kV according to patient size and/or use of iterative reconstruction technique. CONTRAST:  OMNIPAQUE IOHEXOL 350  MG/ML SOLN COMPARISON:  04/20/2023. FINDINGS: CTA CHEST FINDINGS Cardiovascular: Preferential opacification of the thoracic aorta. No evidence of thoracic aortic aneurysm or dissection. Normal heart size. No pericardial effusion. Mediastinum/Nodes: No enlarged mediastinal, hilar, or axillary lymph nodes. Thyroid gland, trachea, and esophagus demonstrate no significant findings. Lungs/Pleura: Mild emphysema. No confluent pulmonary infiltrate. No pneumothorax or pleural effusion. No central obstructing lesion. Musculoskeletal: No chest wall abnormality. No acute or significant osseous findings. Review of the MIP images confirms the above findings. CTA ABDOMEN AND PELVIS FINDINGS VASCULAR Aorta: Normal caliber aorta without aneurysm, dissection, vasculitis or significant stenosis. Celiac: Since the prior examination, the pseudoaneurysm within the splenic hilum has thrombosed. Additionally, there is markedly 2 decreased caliber of the splenic artery diffusely which may relate to interval near complete infarction of the spleen described below. The celiac axis itself is widely patent, as is the left gastric artery and common hepatic artery. No aneurysm or dissection. SMA: Patent without evidence of aneurysm, dissection, vasculitis or significant stenosis. Renals: Main renal arteries bilaterally are widely patent and demonstrate normal vascular morphology. Tiny bilateral accessory lower pole renal arteries are noted. IMA: Patent without evidence of aneurysm, dissection, vasculitis or significant stenosis. Inflow: Patent without evidence of aneurysm, dissection, vasculitis or significant stenosis. Veins: No obvious venous abnormality within the limitations of this arterial phase study. Review of the MIP images confirms the above findings. NON-VASCULAR Hepatobiliary: No focal liver abnormality is seen. No gallstones, gallbladder wall thickening, or biliary dilatation. Pancreas: Punctate calcifications within the pancreatic  tail, head, and uncinate process are identified in keeping with changes of chronic pancreatitis. No superimposed acute peripancreatic inflammatory changes or fluid collections are identified. Pancreatic duct is not dilated. 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 Small amount of residual enhancing tissue is seen within the splenic hilum. Adrenals/Urinary Tract: Adrenal glands are unremarkable. Kidneys are normal, without renal calculi, focal lesion, or hydronephrosis. Bladder is unremarkable. Stomach/Bowel: Interval development of mildly hyperdense fluid throughout the abdomen in keeping with mild hemoperitoneum. This is of relatively low attenuation, however (16-18 Hounsfield units) suggesting that this may be subacute in nature or reflect underlying anemia. The stomach, small bowel, and large bowel are unremarkable. Appendix normal. No free intraperitoneal gas. Lymphatic: No pathologic adenopathy within the abdomen and pelvis. Reproductive: Prostate is unremarkable. Other: No abdominal wall hernia Musculoskeletal: Degenerative changes are seen at L1-2. No acute bone abnormality. No lytic or blastic bone lesion. Review of the MIP images confirms the above findings. IMPRESSION: 1. 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 2. Interval development of mild hemoperitoneum throughout the abdomen. This is of relatively low attenuation, however, suggesting that this may be subacute in nature or reflect underlying anemia. 3. No evidence of thoracic or abdominal aortic aneurysm or dissection. 4. Changes of chronic pancreatitis. No superimposed acute peripancreatic inflammatory changes or fluid collections are identified. Emphysema (ICD10-J43.9). Electronically Signed: By: Helyn Numbers M.D. On: 05/20/2023 01:03   DG Chest Port 1 View  Result Date: 05/19/2023 CLINICAL DATA:  Chest and abdominal pain EXAM: PORTABLE CHEST 1 VIEW  COMPARISON:  04/22/2023 FINDINGS:  Single frontal view of the chest demonstrates a stable cardiac silhouette. Chronic elevation of the left hemidiaphragm. No acute airspace disease, effusion, or pneumothorax. No acute bony abnormality. IMPRESSION: 1. Stable chest, no acute process. Electronically Signed   By: Sharlet Salina M.D.   On: 05/19/2023 23:52    Labs:  CBC: Recent Labs    04/23/23 0830 05/19/23 2338 05/19/23 2340 05/20/23 1315 05/21/23 0355  WBC 15.7* 20.1*  --  26.0* 21.0*  HGB 7.9* 10.2* 11.6* 10.9* 9.0*  HCT 23.0* 32.7* 34.0* 34.9* 28.7*  PLT 349 455*  --  428* 391    COAGS: Recent Labs    04/21/23 0122 05/19/23 2338  INR 1.1 1.0    BMP: Recent Labs    04/23/23 0432 05/19/23 2338 05/19/23 2340 05/20/23 1315 05/21/23 0355  NA 132* 129* 128* 127* 129*  K 4.1 3.8 3.9 4.3 4.2  CL 97* 96* 96* 96* 97*  CO2 27 21*  --  22 26  GLUCOSE 117* 186* 183* 154* 127*  BUN 9 18 18  22* 18  CALCIUM 8.5* 8.9  --  8.6* 8.7*  CREATININE 0.54* 0.74 0.70 0.92 0.60*  GFRNONAA >60 >60  --  >60 >60    LIVER FUNCTION TESTS: Recent Labs    04/07/23 1243 04/20/23 1245 04/21/23 0402 05/19/23 2338  BILITOT 1.1 0.5 0.7 0.8  AST 23 36 18 42*  ALT 22 99* 73* 32  ALKPHOS 114 117 98 93  PROT 8.1 6.9 6.0* 6.5  ALBUMIN 3.7 3.2* 2.7* 2.9*    Assessment and Plan:  62 y.o. male with splenic fossa hematoma after splenectomy on 10/9; S/p aspiration and drain placement by Dr. Bryn Gulling on 11/7   CBC stable  VSS  Cx showed NG less than 24 hrs, final result pending   Drain Location: LUQ Size: Fr size: 12 Fr Date of placement: 11/7  Currently to: Drain collection device: suction bulb 24 hour output:  Output by Drain (mL) 05/19/23 0701 - 05/19/23 1900 05/19/23 1901 - 05/20/23 0700 05/20/23 0701 - 05/20/23 1900 05/20/23 1901 - 05/21/23 0700 05/21/23 0701 - 05/21/23 0951  Closed System Drain 1 Anterior;Lateral LUQ Bulb (JP) 19 Fr.       Closed System Drain 1 Left Abdomen 12 Fr.    80      Interval imaging/drain manipulation:  None   Current examination: Flushes easily, does not aspirate.  Insertion site unremarkable. Suture and stat lock in place. Dressed appropriately.   Plan: Continue TID flushes with 5 cc NS. Record output Q shift. Dressing changes QD or PRN if soiled.  Call IR APP or on call IR MD if difficulty flushing or sudden change in drain output.  Repeat imaging/possible drain injection once output < 10 mL/QD (excluding flush material). Consideration for drain removal if output is < 10 mL/QD (excluding flush material), pending discussion with the providing surgical service.  Discharge planning: Patient states that he may be discharged tomorrow.  Drain care and f/u discussed in detail.  Outpatient f/u has been set, patient states that he has bunch of pre filled saline flush at home.  Please contact IR APP or on call IR MD prior to patient d/c to ensure appropriate follow up plans are in place. Typically patient will follow up with IR clinic 10-14 days post d/c for repeat imaging/possible drain injection. IR scheduler will contact patient with date/time of appointment. Patient will need to flush drain QD with 5 cc NS, record output QD, dressing changes every 2-3 days  or earlier if soiled.   IR will continue to follow - please call with questions or concerns.     Electronically Signed: Willette Brace, PA-C 05/21/2023, 9:45 AM   I spent a total of 15 Minutes at the the patient's bedside AND on the patient's hospital floor or unit, greater than 50% of which was counseling/coordinating care for LUQ drain f/u.   This chart was dictated using voice recognition software.  Despite best efforts to proofread,  errors can occur which can change the documentation meaning.

## 2023-05-22 DIAGNOSIS — S301XXA Contusion of abdominal wall, initial encounter: Secondary | ICD-10-CM | POA: Diagnosis not present

## 2023-05-22 DIAGNOSIS — Z8719 Personal history of other diseases of the digestive system: Secondary | ICD-10-CM | POA: Diagnosis not present

## 2023-05-22 DIAGNOSIS — I10 Essential (primary) hypertension: Secondary | ICD-10-CM | POA: Diagnosis not present

## 2023-05-22 DIAGNOSIS — E871 Hypo-osmolality and hyponatremia: Secondary | ICD-10-CM | POA: Diagnosis not present

## 2023-05-22 LAB — MAGNESIUM: Magnesium: 1.8 mg/dL (ref 1.7–2.4)

## 2023-05-22 LAB — BASIC METABOLIC PANEL
Anion gap: 9 (ref 5–15)
BUN: 10 mg/dL (ref 6–20)
CO2: 27 mmol/L (ref 22–32)
Calcium: 9 mg/dL (ref 8.9–10.3)
Chloride: 94 mmol/L — ABNORMAL LOW (ref 98–111)
Creatinine, Ser: 0.47 mg/dL — ABNORMAL LOW (ref 0.61–1.24)
GFR, Estimated: >60 mL/min (ref >60)
Glucose, Bld: 111 mg/dL — ABNORMAL HIGH (ref 70–99)
Potassium: 4.2 mmol/L (ref 3.5–5.1)
Sodium: 130 mmol/L — ABNORMAL LOW (ref 135–145)

## 2023-05-22 LAB — CBC WITH DIFFERENTIAL/PLATELET
Abs Immature Granulocytes: 0.11 10*3/uL — ABNORMAL HIGH (ref 0.00–0.07)
Basophils Absolute: 0 10*3/uL (ref 0.0–0.1)
Basophils Relative: 0 %
Eosinophils Absolute: 0.1 10*3/uL (ref 0.0–0.5)
Eosinophils Relative: 1 %
HCT: 25.3 % — ABNORMAL LOW (ref 39.0–52.0)
Hemoglobin: 7.9 g/dL — ABNORMAL LOW (ref 13.0–17.0)
Immature Granulocytes: 1 %
Lymphocytes Relative: 10 %
Lymphs Abs: 1.5 10*3/uL (ref 0.7–4.0)
MCH: 27.4 pg (ref 26.0–34.0)
MCHC: 31.2 g/dL (ref 30.0–36.0)
MCV: 87.8 fL (ref 80.0–100.0)
Monocytes Absolute: 1.9 10*3/uL — ABNORMAL HIGH (ref 0.1–1.0)
Monocytes Relative: 12 %
Neutro Abs: 12 10*3/uL — ABNORMAL HIGH (ref 1.7–7.7)
Neutrophils Relative %: 76 %
Platelets: 353 10*3/uL (ref 150–400)
RBC: 2.88 MIL/uL — ABNORMAL LOW (ref 4.22–5.81)
RDW: 14.3 % (ref 11.5–15.5)
WBC: 15.6 10*3/uL — ABNORMAL HIGH (ref 4.0–10.5)
nRBC: 0 % (ref 0.0–0.2)

## 2023-05-22 LAB — GLUCOSE, CAPILLARY
Glucose-Capillary: 141 mg/dL — ABNORMAL HIGH (ref 70–99)
Glucose-Capillary: 114 mg/dL — ABNORMAL HIGH (ref 70–99)
Glucose-Capillary: 130 mg/dL — ABNORMAL HIGH (ref 70–99)
Glucose-Capillary: 126 mg/dL — ABNORMAL HIGH (ref 70–99)

## 2023-05-22 NOTE — Progress Notes (Signed)
PROGRESS NOTE        PATIENT DETAILS Name: Samuel Abbott Age: 62 y.o. Sex: male Date of Birth: 07-05-1961 Admit Date: 05/19/2023 Admitting Physician Tereasa Coop, MD [id number], Suzan Slick, PA  Brief Summary: Patient is a 62 y.o.  male with history of recent splenectomy for a spontaneous splenic hematoma-presenting with subjective fever/abdominal pain-CT abdomen/pelvis showed a large hematoma in the splenic bed-evaluated by IR/CCS-underwent CT-guided drain placement on 11/7.    Significant events: 11/6>> admit to [facility]  Significant studies: 11/7>> CT angiography chest/abdomen/pelvis: Large hematoma in the splenic bed.  Significant microbiology data: 11/7>> blood culture: No growth 11/7>> hematoma splenic bed culture: No growth  Procedures: 11/7>> CT-guided drain placement-left upper quadrant hematoma  Consults: General surgery IR  Subjective: No major complaints  Objective: Vitals: Blood pressure 112/68, pulse 74, temperature 98.7 F (37.1 C), temperature source Oral, resp. rate 18, height 6' (1.829 m), weight 70.3 kg, SpO2 97%.   Exam: Gen Exam:Alert awake-not in any distress HEENT:atraumatic, normocephalic Chest: B/L clear to auscultation anteriorly CVS:S1S2 regular Abdomen: Soft-mildly tender in the left upper quadrant area. 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 Continue empiric IV antibiotics in the interim  IR/CCS following-await further recommendations.  Hyponatremia Mild-asymptomatic Volume status stable Sodium level slowly improving.  Normocytic anemia Due to acute illness/acute blood loss due to splenic bed hematoma-recent splenectomy Hb downtrending-continue to follow-if significant drop-will need transfusion.  HLD Tricor  HTN BP stable Losartan on hold-resume on discharge  DM-2 (A1c 7.0 on 10/9) SSI Resume metformin on discharge  Recent Labs    05/21/23 1601 05/21/23 1921 05/22/23 0808  GLUCAP 137* 160* 141*     Chronic pancreatitis Exam stable-on narcotics for pain.  Generalized anxiety disorder Appears stable-pain well-controlled Continue Cymbalta As needed Xanax  Chronic pain syndrome Reviewed prior notes-has exhibited narcotic seeking behavior in the past For now continue with current dosing of narcotics Patient aware that he will be only provided a few day supply of oral narcotics on discharge-and for further refills he will need to follow-up with his primary care practitioner.  Alcohol use No withdrawal symptoms currently Ativan per CIWA protocol  BMI: Estimated body mass index is 21.02 kg/m as calculated from the following:   Height as of this encounter: 6' (1.829 m).   Weight as of this encounter: 70.3 kg.   Code status:   Code Status: Full Code   DVT Prophylaxis: SCDs Start: 05/20/23 0551   Family Communication: None at bedside   Disposition Plan: Status is: Inpatient Remains  inpatient appropriate because: Severity of illness   Planned Discharge Destination:Home   Diet: Diet Order             Diet Carb Modified Fluid consistency: Thin  Diet effective now                     Antimicrobial agents: Anti-infectives (From admission, onward)    Start      Dose/Rate Route Frequency Ordered Stop   05/20/23 0630  ciprofloxacin (CIPRO) IVPB 400 mg        400 mg 200 mL/hr over 60 Minutes Intravenous Every 12 hours 05/20/23 0617 05/27/23 0629   05/20/23 0630  metroNIDAZOLE (FLAGYL) IVPB 500 mg        500 mg 100 mL/hr over 60 Minutes Intravenous Every 12 hours 05/20/23 0617 05/23/23 1829        MEDICATIONS: Scheduled Meds:  sodium chloride   Intravenous Once   DULoxetine  20 mg Oral Daily   folic acid  1 mg Oral Daily   insulin aspart  0-9 Units Subcutaneous TID WC   multivitamin with minerals  1 tablet Oral Daily   nicotine  21 mg Transdermal Daily   sodium chloride flush  3 mL Intravenous Q12H   thiamine  100 mg Oral Daily   Continuous Infusions:  ciprofloxacin 400 mg (05/22/23 0532)   metronidazole 500 mg (05/22/23 0647)   PRN Meds:.acetaminophen **OR** acetaminophen, ALPRAZolam, benzonatate, hydrALAZINE, HYDROmorphone (DILAUDID) injection, levalbuterol, methocarbamol, ondansetron **OR** ondansetron (ZOFRAN) IV, oxyCODONE, prochlorperazine, sodium chloride flush   I have personally reviewed following labs and imaging studies  LABORATORY DATA: CBC: Recent Labs  Lab 05/19/23 2338 05/19/23 2340 05/20/23 1315 05/21/23 0355 05/22/23 0415  WBC 20.1*  --  26.0* 21.0* 15.6*  NEUTROABS 15.8*  --   --  16.6* 12.0*  HGB 10.2* 11.6* 10.9* 9.0* 7.9*  HCT 32.7* 34.0* 34.9* 28.7* 25.3*  MCV 89.6  --  89.0 88.0 87.8  PLT 455*  --  428* 391 353    Basic Metabolic Panel: Recent Labs  Lab 05/19/23 2338 05/19/23 2340 05/20/23 1315 05/21/23 0355 05/22/23 0415  NA 129* 128* 127* 129* 130*  K 3.8 3.9 4.3 4.2 4.2  CL 96* 96* 96* 97* 94*  CO2 21*  --  22 26 27   GLUCOSE 186* 183* 154* 127* 111*  BUN 18 18 22* 18 10  CREATININE 0.74 0.70 0.92 0.60* 0.47*  CALCIUM 8.9  --  8.6* 8.7* 9.0  MG  --   --   --  1.9 1.8    GFR: Estimated Creatinine Clearance: 97.6 mL/min (A) (by C-G formula based on SCr of 0.47 mg/dL (L)).  Liver  Function Tests: Recent Labs  Lab 05/19/23 2338  AST 42*  ALT 32  ALKPHOS 93  BILITOT 0.8  PROT 6.5  ALBUMIN 2.9*   Recent Labs  Lab 05/19/23 2338  LIPASE 120*   No results for input(s): "AMMONIA" in the last 168 hours.  Coagulation Profile: Recent Labs  Lab 05/19/23 2338  INR 1.0    Cardiac Enzymes: No results for input(s): "CKTOTAL", "CKMB", "CKMBINDEX", "TROPONINI" in the last 168 hours.  BNP (last 3 results) No results for input(s): "PROBNP" in the last 8760 hours.  Lipid Profile: No results for input(s): "CHOL", "HDL", "LDLCALC", "TRIG", "CHOLHDL", "LDLDIRECT" in the last 72 hours.  Thyroid Function Tests: No results for input(s): "TSH", "T4TOTAL", "FREET4", "T3FREE", "THYROIDAB" in the last 72 hours.  Anemia Panel: Recent Labs    05/20/23 0810  05/20/23 1309 05/20/23 1315  VITAMINB12  --  483  --   FOLATE 15.1 14.2  --   FERRITIN  --   --  503*  TIBC  --   --  263  IRON  --   --  15*  RETICCTPCT  --  1.6  --     Urine analysis:    Component Value Date/Time   COLORURINE YELLOW 12/03/2022 1700   APPEARANCEUR CLEAR 12/03/2022 1700   LABSPEC >1.046 (H) 12/03/2022 1700   PHURINE 6.5 12/03/2022 1700   GLUCOSEU NEGATIVE 12/03/2022 1700   HGBUR NEGATIVE 12/03/2022 1700   BILIRUBINUR NEGATIVE 12/03/2022 1700   KETONESUR 15 (A) 12/03/2022 1700   PROTEINUR TRACE (A) 12/03/2022 1700   NITRITE NEGATIVE 12/03/2022 1700   LEUKOCYTESUR NEGATIVE 12/03/2022 1700    Sepsis Labs: Lactic Acid, Venous    Component Value Date/Time   LATICACIDVEN 0.7 04/21/2023 0354    MICROBIOLOGY: Recent Results (from the past 240 hour(s))  Culture, blood (Routine X 2) w Reflex to ID Panel     Status: None (Preliminary result)   Collection Time: 05/20/23  6:37 AM   Specimen: BLOOD RIGHT ARM  Result Value Ref Range Status   Specimen Description BLOOD RIGHT ARM  Final   Special Requests   Final    BOTTLES DRAWN AEROBIC AND ANAEROBIC Blood Culture results may not be  optimal due to an excessive volume of blood received in culture bottles   Culture   Final    NO GROWTH 2 DAYS Performed at [facility], [address], [place], [place] [address]    Report Status PENDING  Incomplete  Culture, blood (Routine X 2) w Reflex to ID Panel     Status: None (Preliminary result)   Collection Time: 05/20/23  6:37 AM   Specimen: BLOOD RIGHT HAND  Result Value Ref Range Status   Specimen Description BLOOD RIGHT HAND  Final   Special Requests   Final    BOTTLES DRAWN AEROBIC AND ANAEROBIC Blood Culture results may not be optimal due to an excessive volume of blood received in culture bottles   Culture   Final    NO GROWTH 2 DAYS Performed at [facility], [address], [place], [place] [address]    Report Status PENDING  Incomplete  Aerobic/Anaerobic Culture w Gram Stain (surgical/deep wound)     Status: None (Preliminary result)   Collection Time: 05/20/23  2:58 PM   Specimen: Abdomen; Abscess  Result Value Ref Range Status   Specimen Description ABDOMEN ABSCESS  Final   Special Requests NONE  Final   Gram Stain NO WBC SEEN NO ORGANISMS SEEN   Final   Culture   Final    NO GROWTH < 24 HOURS Performed at [facility], [address], [place], [place] [address]    Report Status PENDING  Incomplete    RADIOLOGY STUDIES/RESULTS: CT GUIDED PERITONEAL/RETROPERITONEAL FLUID DRAIN BY PERC CATH  Result Date: 05/20/2023 INDICATION: 62 year old gentleman with left upper quadrant hematoma/fluid collection status post splenectomy presented to the ED with intractable abdominal pain. IR consulted for drain placement. EXAM: CT-guided left upper quadrant drain placement TECHNIQUE: Multidetector CT imaging of the abdomen was performed following the standard protocol without IV contrast. RADIATION DOSE REDUCTION: This exam was performed according to the departmental dose-optimization program which includes automated exposure control, adjustment of the mA  and/or kV according to patient size and/or use of iterative reconstruction technique. MEDICATIONS: The patient is  currently admitted to the hospital and receiving intravenous antibiotics. The antibiotics were administered within an appropriate time frame prior to the initiation of the procedure. ANESTHESIA/SEDATION: Moderate (conscious) sedation was employed during this procedure. A total of Versed 4 mg and Fentanyl 100 mcg was administered intravenously by the radiology nurse. Total intra-service moderate Sedation Time: 22 minutes. The patient's level of consciousness and vital signs were monitored continuously by radiology nursing throughout the procedure under my direct supervision. COMPLICATIONS: None immediate. PROCEDURE: Informed written consent was obtained from the patient after a thorough discussion of the procedural risks, benefits and alternatives. All questions were addressed. Maximal Sterile Barrier Technique was utilized including caps, mask, sterile gowns, sterile gloves, sterile drape, hand hygiene and skin antiseptic. A timeout was performed prior to the initiation of the procedure. Patient positioned supine on the procedure table. The left upper quadrant skin prepped and draped in the usual fashion. Following local administration, the left upper quadrant fluid collection was accessed with a 19 gauge Yueh needle utilizing CT guidance. Yueh needle was removed over 0.035 inch guidewire. Serial dilation was performed followed by placement of 12 [place] multipurpose pigtail drain. Approximately 400 mL of bloody fluid was aspirated. Samples were sent for Gram stain and culture. IMPRESSION: Successful CT-guided insertion of 12 French multipurpose pigtail drain in symptomatic left upper quadrant post splenectomy hematoma. Electronically Signed   By: Acquanetta Belling M.D.   On: 05/20/2023 15:48     LOS: 2 days   Jeoffrey Massed, MD  [facility]    To contact the attending provider between 7A-7P  or the covering provider during after hours 7P-7A, please log into the web site [email or web address] and access using universal [place] password for that web site. If you do not have the password, please call the hospital operator.  05/22/2023, 10:48 AM

## 2023-05-22 NOTE — Plan of Care (Signed)
Pt has rested quietly throughout the night with no distress noted. Alert and oriented. On room air. SR on the monitor. Voids per urinal. Medicated for pain 3 times with relief noted. Up to chair ad lib. No other complaints voiced.     Problem: Education: Goal: Knowledge of General Education information will improve Description: Including pain rating scale, medication(s)/side effects and non-pharmacologic comfort measures Outcome: Progressing   Problem: Health Behavior/Discharge Planning: Goal: Ability to manage health-related needs will improve Outcome: Progressing   Problem: Clinical Measurements: Goal: Ability to maintain clinical measurements within normal limits will improve Outcome: Progressing Goal: Will remain free from infection Outcome: Progressing Goal: Diagnostic test results will improve Outcome: Progressing Goal: Respiratory complications will improve Outcome: Progressing Goal: Cardiovascular complication will be avoided Outcome: Progressing   Problem: Activity: Goal: Risk for activity intolerance will decrease Outcome: Progressing   Problem: Nutrition: Goal: Adequate nutrition will be maintained Outcome: Progressing   Problem: Coping: Goal: Level of anxiety will decrease Outcome: Progressing   Problem: Elimination: Goal: Will not experience complications related to bowel motility Outcome: Progressing Goal: Will not experience complications related to urinary retention Outcome: Progressing   Problem: Pain Management: Goal: General experience of comfort will improve Outcome: Progressing   Problem: Safety: Goal: Ability to remain free from injury will improve Outcome: Progressing   Problem: Skin Integrity: Goal: Risk for impaired skin integrity will decrease Outcome: Progressing   Problem: Education: Goal: Ability to describe self-care measures that may prevent or decrease complications (Diabetes Survival Skills Education) will improve Outcome:  Progressing Goal: Individualized Educational Video(s) Outcome: Progressing   Problem: Coping: Goal: Ability to adjust to condition or change in health will improve Outcome: Progressing   Problem: Fluid Volume: Goal: Ability to maintain a balanced intake and output will improve Outcome: Progressing   Problem: Health Behavior/Discharge Planning: Goal: Ability to identify and utilize available resources and services will improve Outcome: Progressing Goal: Ability to manage health-related needs will improve Outcome: Progressing   Problem: Metabolic: Goal: Ability to maintain appropriate glucose levels will improve Outcome: Progressing   Problem: Nutritional: Goal: Maintenance of adequate nutrition will improve Outcome: Progressing Goal: Progress toward achieving an optimal weight will improve Outcome: Progressing   Problem: Skin Integrity: Goal: Risk for impaired skin integrity will decrease Outcome: Progressing   Problem: Tissue Perfusion: Goal: Adequacy of tissue perfusion will improve Outcome: Progressing

## 2023-05-22 NOTE — Progress Notes (Signed)
Subjective: Tolerating some solid food. Pain improving. Denies N/V. States his mom with stay with him at home for a couple of days.  ROS: See above, otherwise other systems negative  Objective: Vital signs in last 24 hours: Temp:  [98.4 F (36.9 C)-98.9 F (37.2 C)] 98.7 F (37.1 C) (11/09 0628) Pulse Rate:  [74-81] 74 (11/08 1945) Resp:  [15-19] 18 (11/08 1945) BP: (112-120)/(64-68) 112/68 (11/09 0628) SpO2:  [97 %] 97 % (11/08 1945) Last BM Date : 04/19/23  Intake/Output from previous day: 11/08 0701 - 11/09 0700 In: 240 [P.O.:240] Out: 1200 [Urine:1200] Intake/Output this shift: Total I/O In: -  Out: 10 [Drains:10]  PE: Abd: soft, tender in LUQ, JP with some thin old bloody output, incision from surgery well healed  Lab Results:  Recent Labs    05/21/23 0355 05/22/23 0415  WBC 21.0* 15.6*  HGB 9.0* 7.9*  HCT 28.7* 25.3*  PLT 391 353   BMET Recent Labs    05/21/23 0355 05/22/23 0415  NA 129* 130*  K 4.2 4.2  CL 97* 94*  CO2 26 27  GLUCOSE 127* 111*  BUN 18 10  CREATININE 0.60* 0.47*  CALCIUM 8.7* 9.0   PT/INR Recent Labs    05/19/23 2338  LABPROT 13.8  INR 1.0   CMP     Component Value Date/Time   NA 130 (L) 05/22/2023 0415   K 4.2 05/22/2023 0415   CL 94 (L) 05/22/2023 0415   CO2 27 05/22/2023 0415   GLUCOSE 111 (H) 05/22/2023 0415   BUN 10 05/22/2023 0415   CREATININE 0.47 (L) 05/22/2023 0415   CALCIUM 9.0 05/22/2023 0415   PROT 6.5 05/19/2023 2338   ALBUMIN 2.9 (L) 05/19/2023 2338   AST 42 (H) 05/19/2023 2338   ALT 32 05/19/2023 2338   ALKPHOS 93 05/19/2023 2338   BILITOT 0.8 05/19/2023 2338   GFRNONAA >60 05/22/2023 0415   GFRAA >60 09/10/2018 0401   Lipase     Component Value Date/Time   LIPASE 120 (H) 05/19/2023 2338       Studies/Results: CT GUIDED PERITONEAL/RETROPERITONEAL FLUID DRAIN BY PERC CATH  Result Date: 05/20/2023 INDICATION: 62 year old gentleman with left upper quadrant hematoma/fluid collection  status post splenectomy presented to the ED with intractable abdominal pain. IR consulted for drain placement. EXAM: CT-guided left upper quadrant drain placement TECHNIQUE: Multidetector CT imaging of the abdomen was performed following the standard protocol without IV contrast. RADIATION DOSE REDUCTION: This exam was performed according to the departmental dose-optimization program which includes automated exposure control, adjustment of the mA and/or kV according to patient size and/or use of iterative reconstruction technique. MEDICATIONS: The patient is currently admitted to the hospital and receiving intravenous antibiotics. The antibiotics were administered within an appropriate time frame prior to the initiation of the procedure. ANESTHESIA/SEDATION: Moderate (conscious) sedation was employed during this procedure. A total of Versed 4 mg and Fentanyl 100 mcg was administered intravenously by the radiology nurse. Total intra-service moderate Sedation Time: 22 minutes. The patient's level of consciousness and vital signs were monitored continuously by radiology nursing throughout the procedure under my direct supervision. COMPLICATIONS: None immediate. PROCEDURE: Informed written consent was obtained from the patient after a thorough discussion of the procedural risks, benefits and alternatives. All questions were addressed. Maximal Sterile Barrier Technique was utilized including caps, mask, sterile gowns, sterile gloves, sterile drape, hand hygiene and skin antiseptic. A timeout was performed prior to the initiation of the procedure. Patient positioned supine  on the procedure table. The left upper quadrant skin prepped and draped in the usual fashion. Following local administration, the left upper quadrant fluid collection was accessed with a 19 gauge Yueh needle utilizing CT guidance. Yueh needle was removed over 0.035 inch guidewire. Serial dilation was performed followed by placement of 12 [place]  multipurpose pigtail drain. Approximately 400 mL of bloody fluid was aspirated. Samples were sent for Gram stain and culture. IMPRESSION: Successful CT-guided insertion of 12 French multipurpose pigtail drain in symptomatic left upper quadrant post splenectomy hematoma. Electronically Signed   By: Acquanetta Belling M.D.   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 Luisa Hart on 04/21/23. Now with hematoma, ? Infection -s/p IR drain 11/7, GS w/ no growth so far,  doesn't appear infected by looks of fluid -WBC improving, but still may be elevated secondary to asplenic state - no signs of infection on cultures so far. Will discuss with MD but can likely go home in the next 24 hours without the drain, if IR willing to remove.  -carb mod diet  FEN - carb mod diet VTE - ok for DVT prophylaxis from our standpoint ID - cipro/flagyl  DM Chronic pain - may  need outpatient pain doctor ETOH abuse  I reviewed Consultant IR notes, hospitalist notes, last 24 h vitals and pain scores, last 48 h intake and output, last 24 h labs and trends, and last 24 h imaging results.   LOS: 2 days    Adam Phenix , [facility] 05/22/2023, 11:37 AM Please see Amion for pager number during day hours 7:00am-4:30pm or 7:00am -11:30am on weekends

## 2023-05-23 DIAGNOSIS — S301XXA Contusion of abdominal wall, initial encounter: Secondary | ICD-10-CM | POA: Diagnosis not present

## 2023-05-23 DIAGNOSIS — Z8719 Personal history of other diseases of the digestive system: Secondary | ICD-10-CM | POA: Diagnosis not present

## 2023-05-23 DIAGNOSIS — Z789 Other specified health status: Secondary | ICD-10-CM | POA: Diagnosis not present

## 2023-05-23 DIAGNOSIS — J439 Emphysema, unspecified: Secondary | ICD-10-CM | POA: Diagnosis not present

## 2023-05-23 LAB — BASIC METABOLIC PANEL
Anion gap: 9 (ref 5–15)
BUN: <5 mg/dL — ABNORMAL LOW (ref 6–20)
CO2: 29 mmol/L (ref 22–32)
Calcium: 8.7 mg/dL — ABNORMAL LOW (ref 8.9–10.3)
Chloride: 89 mmol/L — ABNORMAL LOW (ref 98–111)
Creatinine, Ser: 0.48 mg/dL — ABNORMAL LOW (ref 0.61–1.24)
GFR, Estimated: >60 mL/min (ref >60)
Glucose, Bld: 99 mg/dL (ref 70–99)
Potassium: 3.8 mmol/L (ref 3.5–5.1)
Sodium: 127 mmol/L — ABNORMAL LOW (ref 135–145)

## 2023-05-23 LAB — CBC WITH DIFFERENTIAL/PLATELET
Abs Immature Granulocytes: 0.05 10*3/uL (ref 0.00–0.07)
Basophils Absolute: 0.1 10*3/uL (ref 0.0–0.1)
Basophils Relative: 0 %
Eosinophils Absolute: 0.2 10*3/uL (ref 0.0–0.5)
Eosinophils Relative: 2 %
HCT: 26.5 % — ABNORMAL LOW (ref 39.0–52.0)
Hemoglobin: 8.4 g/dL — ABNORMAL LOW (ref 13.0–17.0)
Immature Granulocytes: 0 %
Lymphocytes Relative: 15 %
Lymphs Abs: 1.7 10*3/uL (ref 0.7–4.0)
MCH: 27.9 pg (ref 26.0–34.0)
MCHC: 31.7 g/dL (ref 30.0–36.0)
MCV: 88 fL (ref 80.0–100.0)
Monocytes Absolute: 1.6 10*3/uL — ABNORMAL HIGH (ref 0.1–1.0)
Monocytes Relative: 14 %
Neutro Abs: 7.8 10*3/uL — ABNORMAL HIGH (ref 1.7–7.7)
Neutrophils Relative %: 69 %
Platelets: 452 10*3/uL — ABNORMAL HIGH (ref 150–400)
RBC: 3.01 MIL/uL — ABNORMAL LOW (ref 4.22–5.81)
RDW: 14.2 % (ref 11.5–15.5)
WBC: 11.4 10*3/uL — ABNORMAL HIGH (ref 4.0–10.5)
nRBC: 0 % (ref 0.0–0.2)

## 2023-05-23 LAB — GLUCOSE, CAPILLARY
Glucose-Capillary: 119 mg/dL — ABNORMAL HIGH (ref 70–99)
Glucose-Capillary: 134 mg/dL — ABNORMAL HIGH (ref 70–99)

## 2023-05-23 LAB — MAGNESIUM: Magnesium: 1.6 mg/dL — ABNORMAL LOW (ref 1.7–2.4)

## 2023-05-23 MED ORDER — OXYCODONE HCL 5 MG PO TABS: 5.0000 mg | ORAL_TABLET | Freq: Four times a day (QID) | ORAL | 0 refills | Status: DC | PRN

## 2023-05-23 MED ORDER — MAGNESIUM SULFATE 2 GM/50ML IV SOLN
2.0000 g | Freq: Once | INTRAVENOUS | Status: AC
  Administered 2023-05-23: 2 g via INTRAVENOUS
  Filled 2023-05-23: qty 50

## 2023-05-23 MED ORDER — SODIUM CHLORIDE 0.9% FLUSH
5.0000 mL | Freq: Three times a day (TID) | INTRAVENOUS | Status: DC
  Administered 2023-05-23: 5 mL

## 2023-05-23 NOTE — Plan of Care (Signed)

## 2023-05-23 NOTE — Progress Notes (Signed)
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 Bryn Gulling on 11/7   Subjective:  Patient sitting in a recliner, NAD.  Continue to recover well.  Note output has declined and IR drain removal requested by surgery team.  Patient agreeable with plan.   Allergies: Amoxil [amoxicillin], Penicillins, and Motrin [ibuprofen]  Medications: Prior to Admission medications   Medication Sig Start Date End Date Taking? Authorizing Provider  acetaminophen (TYLENOL) 500 MG tablet Take 2 tablets (1,000 mg total) by mouth every 6 (six) hours as needed for mild pain, moderate pain, fever or headache. 04/23/23  Yes Barnetta Chapel, PA-C  ALPRAZolam Prudy Feeler) 0.25 MG tablet Take 1 tablet by mouth 2 (two) times daily as needed for anxiety.   Yes [provider]  DULoxetine (CYMBALTA) 20 MG capsule Take 20 mg by mouth daily. For back pain 01/13/21  Yes [provider]  losartan (COZAAR) 25 MG tablet Take 25 mg by mouth daily. 09/16/20  Yes [provider]  metFORMIN (GLUCOPHAGE) 500 MG tablet Take 1 tablet (500 mg total) by mouth daily with breakfast. Patient taking differently: Take 250 mg by mouth 2 (two) times daily. 01/24/21 06/19/23 Yes Maness, Loistine Chance, MD  methocarbamol (ROBAXIN) 500 MG tablet Take 1 tablet (500 mg total) by mouth every 8 (eight) hours as needed for muscle spasms. 04/23/23  Yes Barnetta Chapel, PA-C  Multiple Vitamin (MULTIVITAMIN) tablet Take 1 tablet by mouth daily.   Yes [provider]  ondansetron (ZOFRAN) 4 MG tablet Take 1 tablet (4 mg total) by mouth every 8 (eight) hours as needed for nausea or vomiting. 04/23/23  Yes Leroy Sea, MD  oxyCODONE (OXY IR/ROXICODONE) 5 MG immediate release tablet Take 5 mg by mouth at bedtime as needed for moderate pain (pain score 4-6).   Yes  [provider]  polyethylene glycol powder (GLYCOLAX/MIRALAX) 17 GM/SCOOP powder Take 17 g by mouth daily. 04/23/23  Yes Leroy Sea, MD  predniSONE (DELTASONE) 20 MG tablet Take 20 mg by mouth daily. 05/10/23  Yes [provider]  senna-docusate (SENOKOT-S) 8.6-50 MG tablet Take 2 tablets by mouth at bedtime as needed for mild constipation. 04/23/23  Yes Leroy Sea, MD  tiZANidine (ZANAFLEX) 4 MG tablet Take 4 mg by mouth every 8 (eight) hours as needed for muscle spasms. 05/10/23  Yes [provider]  fenofibrate (TRICOR) 145 MG tablet Take 1 tablet by mouth daily. Patient not taking: Reported on 05/20/2023    [provider]     Vital Signs: BP 123/67 (BP Location: Left Arm)   Pulse 70   Temp 98.3 F (36.8 C) (Oral)   Resp 18   Ht 6' (1.829 m)   Wt 155 lb (70.3 kg)   SpO2 95%   BMI 21.02 kg/m   Physical Exam Vitals reviewed.  Constitutional:      General: He is not in acute distress.    Appearance: He is not ill-appearing.  HENT:     Head: Normocephalic and atraumatic.  Pulmonary:     Effort: Pulmonary effort is normal.  Abdominal:     General: Abdomen is flat.     Comments: LUQ drain to a suction bulb. Site is unremarkable with no erythema, edema, tenderness, bleeding or drainage. Suture and stat lock in place. Dressing is clean, dry, and intact. Trace serosanguinous  fluid noted in the bulb.    Neurological:     Mental Status: He is alert.  Psychiatric:        Mood and Affect: Mood normal.        Behavior: Behavior normal.     Imaging: CT GUIDED PERITONEAL/RETROPERITONEAL FLUID DRAIN BY PERC CATH  Result Date: 05/20/2023 INDICATION: 62 year old gentleman with left upper quadrant hematoma/fluid collection status post splenectomy presented to the ED with intractable abdominal pain. IR consulted for drain placement. EXAM: CT-guided left upper quadrant drain placement TECHNIQUE: Multidetector CT imaging of the abdomen was  performed following the standard protocol without IV contrast. RADIATION DOSE REDUCTION: This exam was performed according to the departmental dose-optimization program which includes automated exposure control, adjustment of the mA and/or kV according to patient size and/or use of iterative reconstruction technique. MEDICATIONS: The patient is currently admitted to the hospital and receiving intravenous antibiotics. The antibiotics were administered within an appropriate time frame prior to the initiation of the procedure. ANESTHESIA/SEDATION: Moderate (conscious) sedation was employed during this procedure. A total of Versed 4 mg and Fentanyl 100 mcg was administered intravenously by the radiology nurse. Total intra-service moderate Sedation Time: 22 minutes. The patient's level of consciousness and vital signs were monitored continuously by radiology nursing throughout the procedure under my direct supervision. COMPLICATIONS: None immediate. PROCEDURE: Informed written consent was obtained from the patient after a thorough discussion of the procedural risks, benefits and alternatives. All questions were addressed. Maximal Sterile Barrier Technique was utilized including caps, mask, sterile gowns, sterile gloves, sterile drape, hand hygiene and skin antiseptic. A timeout was performed prior to the initiation of the procedure. Patient positioned supine on the procedure table. The left upper quadrant skin prepped and draped in the usual fashion. Following local administration, the left upper quadrant fluid collection was accessed with a 19 gauge Yueh needle utilizing CT guidance. Yueh needle was removed over 0.035 inch guidewire. Serial dilation was performed followed by placement of 12 [place] multipurpose pigtail drain. Approximately 400 mL of bloody fluid was aspirated. Samples were sent for Gram stain and culture. IMPRESSION: Successful CT-guided insertion of 12 [place] multipurpose pigtail drain in symptomatic  left upper quadrant post splenectomy hematoma. Electronically Signed   By: Acquanetta Belling M.D.   On: 05/20/2023 15:48   CT Angio Chest/Abd/Pel for Dissection W and/or W/WO  Addendum Date: 05/20/2023   ADDENDUM REPORT: 05/20/2023 02:02 ADDENDUM: Additional history was provided in that the patient has recently undergone splenectomy via a chevron incision. In light of this, the collection seen within the left upper quadrant represents a large hematoma within the splenic bed measuring 9.8 x 14.1 x 10.6 cm (volume = 770 cm^3) with a more hyperdense region centrally possibly representing sentinel clot or hemostatic packing material. There are punctate foci of gas within the hematoma at the superior margin though this is nonspecific and may simply represent gas related to open splenectomy. Super infection, however, is not excluded on this examination alone and correlation with the patient's clinical picture is recommended. If indicated, diagnostic sampling of the hematoma for microbial analysis could be considered. Electronically Signed   By: Helyn Numbers M.D.   On: 05/20/2023 02:02   Result Date: 05/20/2023 CLINICAL DATA:  Acute aortic syndrome, abdominal pain, diaphoresis. 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 Multidetector CT imaging through the chest, abdomen and pelvis was performed using the standard protocol  during bolus administration of intravenous contrast. Multiplanar reconstructed images and MIPs were obtained and reviewed to evaluate the vascular anatomy. RADIATION DOSE REDUCTION: This exam was performed according to the departmental dose-optimization program which includes automated exposure control, adjustment of the mA and/or kV according to patient size and/or use of iterative reconstruction technique. CONTRAST:  OMNIPAQUE IOHEXOL 350 MG/ML SOLN  COMPARISON:  04/20/2023. FINDINGS: CTA CHEST FINDINGS Cardiovascular: Preferential opacification of the thoracic aorta. No evidence of thoracic aortic aneurysm or dissection. Normal heart size. No pericardial effusion. Mediastinum/Nodes: No enlarged mediastinal, hilar, or axillary lymph nodes. Thyroid gland, trachea, and esophagus demonstrate no significant findings. Lungs/Pleura: Mild emphysema. No confluent pulmonary infiltrate. No pneumothorax or pleural effusion. No central obstructing lesion. Musculoskeletal: No chest wall abnormality. No acute or significant osseous findings. Review of the MIP images confirms the above findings. CTA ABDOMEN AND PELVIS FINDINGS VASCULAR Aorta: Normal caliber aorta without aneurysm, dissection, vasculitis or significant stenosis. Celiac: Since the prior examination, the pseudoaneurysm within the splenic hilum has thrombosed. Additionally, there is markedly 2 decreased caliber of the splenic artery diffusely which may relate to interval near complete infarction of the spleen described below. The celiac axis itself is widely patent, as is the left gastric artery and common hepatic artery. No aneurysm or dissection. SMA: Patent without evidence of aneurysm, dissection, vasculitis or significant stenosis. Renals: Main renal arteries bilaterally are widely patent and demonstrate normal vascular morphology. Tiny bilateral accessory lower pole renal arteries are noted. IMA: Patent without evidence of aneurysm, dissection, vasculitis or significant stenosis. Inflow: Patent without evidence of aneurysm, dissection, vasculitis or significant stenosis. Veins: No obvious venous abnormality within the limitations of this arterial phase study. Review of the MIP images confirms the above findings. NON-VASCULAR Hepatobiliary: No focal liver abnormality is seen. No gallstones, gallbladder wall thickening, or biliary dilatation. Pancreas: Punctate calcifications within the pancreatic tail, head,  and uncinate process are identified in keeping with changes of chronic pancreatitis. No superimposed acute peripancreatic inflammatory changes or fluid collections are identified. Pancreatic duct is not dilated. 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 Small amount of residual enhancing tissue is seen within the splenic hilum. Adrenals/Urinary Tract: Adrenal glands are unremarkable. Kidneys are normal, without renal calculi, focal lesion, or hydronephrosis. Bladder is unremarkable. Stomach/Bowel: Interval development of mildly hyperdense fluid throughout the abdomen in keeping with mild hemoperitoneum. This is of relatively low attenuation, however (16-18 Hounsfield units) suggesting that this may be subacute in nature or reflect underlying anemia. The stomach, small bowel, and large bowel are unremarkable. Appendix normal. No free intraperitoneal gas. Lymphatic: No pathologic adenopathy within the abdomen and pelvis. Reproductive: Prostate is unremarkable. Other: No abdominal wall hernia Musculoskeletal: Degenerative changes are seen at L1-2. No acute bone abnormality. No lytic or blastic bone lesion. Review of the MIP images confirms the above findings. IMPRESSION: 1. 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 2. Interval development of mild hemoperitoneum throughout the abdomen. This is of relatively low attenuation, however, suggesting that this may be subacute in nature or reflect underlying anemia. 3. No evidence of thoracic or abdominal aortic aneurysm or dissection. 4. Changes of chronic pancreatitis. No superimposed acute peripancreatic inflammatory changes or fluid collections are identified. Emphysema (ICD10-J43.9). Electronically Signed: By: Helyn Numbers M.D. On: 05/20/2023 01:03   DG Chest Port 1 View  Result Date: 05/19/2023 CLINICAL DATA:  Chest and abdominal pain EXAM: PORTABLE CHEST 1 VIEW COMPARISON:   04/22/2023 FINDINGS: Single  frontal view of the chest demonstrates a stable cardiac silhouette. Chronic elevation of the left hemidiaphragm. No acute airspace disease, effusion, or pneumothorax. No acute bony abnormality. IMPRESSION: 1. Stable chest, no acute process. Electronically Signed   By: Sharlet Salina M.D.   On: 05/19/2023 23:52    Labs:  CBC: Recent Labs    05/20/23 1315 05/21/23 0355 05/22/23 0415 05/23/23 0348  WBC 26.0* 21.0* 15.6* 11.4*  HGB 10.9* 9.0* 7.9* 8.4*  HCT 34.9* 28.7* 25.3* 26.5*  PLT 428* 391 353 452*    COAGS: Recent Labs    04/21/23 0122 05/19/23 2338  INR 1.1 1.0    BMP: Recent Labs    05/20/23 1315 05/21/23 0355 05/22/23 0415 05/23/23 0348  NA 127* 129* 130* 127*  K 4.3 4.2 4.2 3.8  CL 96* 97* 94* 89*  CO2 22 26 27 29   GLUCOSE 154* 127* 111* 99  BUN 22* 18 10 <5*  CALCIUM 8.6* 8.7* 9.0 8.7*  CREATININE 0.92 0.60* 0.47* 0.48*  GFRNONAA >60 >60 >60 >60    LIVER FUNCTION TESTS: Recent Labs    04/07/23 1243 04/20/23 1245 04/21/23 0402 05/19/23 2338  BILITOT 1.1 0.5 0.7 0.8  AST 23 36 18 42*  ALT 22 99* 73* 32  ALKPHOS 114 117 98 93  PROT 8.1 6.9 6.0* 6.5  ALBUMIN 3.7 3.2* 2.7* 2.9*    Assessment and Plan:  62 y.o. male with splenic fossa hematoma after splenectomy on 10/9; S/p aspiration and drain placement by Dr. Bryn Gulling on 11/7   CBC stable  VSS   Culture has thus far been negative.   Collection most consistent with sterile hematoma.  Output has dwindled over the past 24 hrs.  IR asked to remove drain prior to discharge by surgery team.   PA to bedside for drain removal.  Removed in its entirety without complication.  Dressing placed.   Care instructions given.  No follow-up needed with IR.    Electronically Signed: Hoyt Koch, PA 05/23/2023, 1:38 PM   I spent a total of 15 Minutes at the the patient's bedside AND on the patient's hospital floor or unit, greater than 50% of which was  counseling/coordinating care for LUQ hematoma.

## 2023-05-23 NOTE — Plan of Care (Signed)
Plan of care reviewed.  Problem: Clinical Measurements: Goal: Ability to maintain clinical measurements within normal limits will improve Outcome: Progressing Goal: Will remain free from infection Outcome: Progressing Goal: Diagnostic test results will improve Outcome: Progressing Goal: Respiratory complications will improve Outcome: Progressing Goal: Cardiovascular complication will be avoided Outcome: Progressing   Problem: Health Behavior/Discharge Planning: Goal: Ability to manage health-related needs will improve Outcome: Progressing   Problem: Activity: Goal: Risk for activity intolerance will decrease Outcome: Progressing   Problem: Nutrition: Goal: Adequate nutrition will be maintained Outcome: Progressing   Problem: Elimination: Goal: Will not experience complications related to bowel motility Outcome: Progressing Goal: Will not experience complications related to urinary retention Outcome: Progressing   Problem: Pain Management: Goal: General experience of comfort will improve Outcome: Progressing   Filiberto Pinks, RN

## 2023-05-23 NOTE — Progress Notes (Signed)
Subjective: NAEO  ROS: See above, otherwise other systems negative  Objective: Vital signs in last 24 hours: Temp:  [98.3 F (36.8 C)-98.6 F (37 C)] 98.3 F (36.8 C) (11/10 0336) Pulse Rate:  [70-77] 70 (11/10 0336) Resp:  [18] 18 (11/10 0336) BP: (116-126)/(67-92) 123/67 (11/10 0336) SpO2:  [95 %] 95 % (11/10 0336) Last BM Date : 05/20/23  Intake/Output from previous day: 11/09 0701 - 11/10 0700 In: 1060 [P.O.:500; IV Piggyback:550] Out: 305 [Urine:250; Drains:55] Intake/Output this shift: No intake/output data recorded.  PE: Up in the chair, NAD Normal respiratory effort JP drain SS  Lab Results:  Recent Labs    05/22/23 0415 05/23/23 0348  WBC 15.6* 11.4*  HGB 7.9* 8.4*  HCT 25.3* 26.5*  PLT 353 452*   BMET Recent Labs    05/22/23 0415 05/23/23 0348  NA 130* 127*  K 4.2 3.8  CL 94* 89*  CO2 27 29  GLUCOSE 111* 99  BUN 10 <5*  CREATININE 0.47* 0.48*  CALCIUM 9.0 8.7*   PT/INR No results for input(s): "LABPROT", "INR" in the last 72 hours.  CMP     Component Value Date/Time   NA 127 (L) 05/23/2023 0348   K 3.8 05/23/2023 0348   CL 89 (L) 05/23/2023 0348   CO2 29 05/23/2023 0348   GLUCOSE 99 05/23/2023 0348   BUN <5 (L) 05/23/2023 0348   CREATININE 0.48 (L) 05/23/2023 0348   CALCIUM 8.7 (L) 05/23/2023 0348   PROT 6.5 05/19/2023 2338   ALBUMIN 2.9 (L) 05/19/2023 2338   AST 42 (H) 05/19/2023 2338   ALT 32 05/19/2023 2338   ALKPHOS 93 05/19/2023 2338   BILITOT 0.8 05/19/2023 2338   GFRNONAA >60 05/23/2023 0348   GFRAA >60 09/10/2018 0401   Lipase     Component Value Date/Time   LIPASE 120 (H) 05/19/2023 2338       Studies/Results: No results found.  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 Luisa Hart on 04/21/23. Now with hematoma, ? Infection -s/p IR drain 11/7, GS w/ no growth so far,  doesn't appear infected by looks of fluid -WBC improving, but still may be elevated secondary to asplenic state - no signs of infection on cultures so far. IR planning to D/C drain today. Stable for discharge from [facility] standpoint -carb mod diet  FEN - carb mod diet VTE - ok for DVT prophylaxis from our standpoint ID - cipro/flagyl  DM Chronic pain - may  need outpatient pain doctor ETOH abuse  I reviewed Consultant IR notes, hospitalist notes, last 24 h vitals and pain scores, last 48 h intake and output, last 24 h labs and trends, and last 24 h imaging results.   LOS: 3 days    Adam Phenix , [facility] 05/23/2023, 11:18 AM Please see Amion for pager number during day hours 7:00am-4:30pm  or 7:00am -11:30am on weekends

## 2023-05-23 NOTE — Discharge Summary (Signed)
PATIENT DETAILS Name: Samuel Abbott Age: 62 y.o. Sex: male Date of Birth: Nov 24, 1960 MRN: [id number]. Admitting Physician: Tereasa Coop, MD RJJ:OACZYSAY, Suzan Slick, [place]  Admit Date: 05/19/2023 Discharge date: 05/23/2023  Recommendations for Outpatient Follow-up:  Follow up with PCP in 1-2 weeks Please obtain CMP/CBC in one week  Admitted From:  Home  Disposition: Home   Discharge Condition: good  CODE STATUS:   Code Status: Full Code   Diet recommendation:  Diet Order             Diet - low sodium heart healthy           Diet Carb Modified           Diet Carb Modified Fluid consistency: Thin  Diet effective now                    Brief Summary: Patient is a 62 y.o.  male with history of recent splenectomy for a spontaneous splenic hematoma-presenting with subjective fever/abdominal pain-CT abdomen/pelvis showed a large hematoma in the splenic bed-evaluated by IR/CCS-underwent CT-guided drain placement on 11/7.     Significant events: 11/6>> admit to [facility]   Significant studies: 11/7>> CT angiography chest/abdomen/pelvis: Large hematoma in the splenic bed.   Significant microbiology data: 11/7>> blood culture: No growth 11/7>> hematoma splenic bed culture: No growth   Procedures: 11/7>> CT-guided drain placement-left upper quadrant hematoma   Consults: General surgery IR   Brief Hospital Course: Splenic bed hematoma-s/p recent splenectomy 04/21/2023 Although had subjective fever/leukocytosis-drainage appears to be frank blood-doubt infection.  Cultures negative so far as well.  Underwent CT-guided drain placement by IR-since cultures continue be negative-IR planning to remove drain today following which he will be discharged home.  Leukocytosis continue to downtrend.  Plan is to monitor off antibiotics on discharge.  Hyponatremia Mild-asymptomatic Volume status stable Sodium levels continue to fluctuate-have asked patient to follow-up with PCP  and repeat electrolytes in 1 week.   Normocytic anemia Due to acute illness/acute blood loss due to splenic bed hematoma-recent splenectomy Hb levels fluctuating but relatively stable over the past several days-have asked patient to follow-up with PCP and repeat CBC in 1 week.   HLD Tricor   HTN BP stable Resume on discharge   DM-2 (A1c 7.0 on 10/9) SSI while inpatient Resume metformin on discharge  BMI: Estimated body mass index is 21.02 kg/m as calculated from the following:   Height as of this encounter: 6' (1.829 m).   Weight as of this encounter: 70.3 kg.    Discharge Diagnoses:  Principal Problem:   Abdominal hematoma-hematoma of the splenic fossa Active Problems:   Status post splenectomy   SIRS (systemic inflammatory response syndrome) (HCC)   Abdominal pain   Hyperlipidemia   Essential hypertension   Alcohol use   History of chronic pancreatitis   Pain syndrome, chronic   GAD (generalized anxiety disorder)   Chronic anemia   Emphysema lung (HCC)   Hyponatremia   Discharge Instructions:  Activity:  As tolerated with Full fall precautions use walker/cane & assistance as needed Discharge Instructions     Call MD for:  persistant nausea and vomiting   Complete by: As directed    Call MD for:  severe uncontrolled pain   Complete by: As directed    Diet - low sodium heart healthy   Complete by: As directed    Diet Carb Modified   Complete by: As directed    Discharge instructions   Complete  by: As directed    Follow with Primary MD  Norm Salt, PA in 1-2 weeks  Please get a complete blood count and chemistry panel checked by your Primary MD at your next visit, and again as instructed by your Primary MD.  Get Medicines reviewed and adjusted: Please take all your medications with you for your next visit with your Primary MD  Laboratory/radiological data: Please request your Primary MD to go over all hospital tests and procedure/radiological  results at the follow up, please ask your Primary MD to get all Hospital records sent to his/her office.  In some cases, they will be blood work, cultures and biopsy results pending at the time of your discharge. Please request that your primary care M.D. follows up on these results.  Also Note the following: If you experience worsening of your admission symptoms, develop shortness of breath, life threatening emergency, suicidal or homicidal thoughts you must seek medical attention immediately by calling 911 or calling your MD immediately  if symptoms less severe.  You must read complete instructions/literature along with all the possible adverse reactions/side effects for all the Medicines you take and that have been prescribed to you. Take any new Medicines after you have completely understood and accpet all the possible adverse reactions/side effects.   Do not drive when taking Pain medications or sleeping medications (Benzodaizepines)  Do not take more than prescribed Pain, Sleep and Anxiety Medications. It is not advisable to combine anxiety,sleep and pain medications without talking with your primary care practitioner  Special Instructions: If you have smoked or chewed Tobacco  in the last 2 yrs please stop smoking, stop any regular Alcohol  and or any Recreational drug use.  Wear Seat belts while driving.  Please note: You were cared for by a hospitalist during your hospital stay. Once you are discharged, your primary care physician will handle any further medical issues. Please note that NO REFILLS for any discharge medications will be authorized once you are discharged, as it is imperative that you return to your primary care physician (or establish a relationship with a primary care physician if you do not have one) for your post hospital discharge needs so that they can reassess your need for medications and monitor your lab values.   Increase activity slowly   Complete by: As  directed    No wound care   Complete by: As directed       Allergies as of 05/23/2023       Reactions   Amoxil [amoxicillin] Anaphylaxis   Penicillins Anaphylaxis   Motrin [ibuprofen] Other (See Comments)   Stomach pain after several uses of 800mg  tablets.        Medication List     STOP taking these medications    predniSONE 20 MG tablet Commonly known as: DELTASONE       TAKE these medications    acetaminophen 500 MG tablet Commonly known as: TYLENOL Take 2 tablets (1,000 mg total) by mouth every 6 (six) hours as needed for mild pain, moderate pain, fever or headache.   ALPRAZolam 0.25 MG tablet Commonly known as: XANAX Take 1 tablet by mouth 2 (two) times daily as needed for anxiety.   DULoxetine 20 MG capsule Commonly known as: CYMBALTA Take 20 mg by mouth daily. For back pain   fenofibrate 145 MG tablet Commonly known as: TRICOR Take 1 tablet by mouth daily.   losartan 25 MG tablet Commonly known as: COZAAR Take 25 mg by mouth  daily.   metFORMIN 500 MG tablet Commonly known as: Glucophage Take 1 tablet (500 mg total) by mouth daily with breakfast. What changed:  how much to take when to take this   methocarbamol 500 MG tablet Commonly known as: ROBAXIN Take 1 tablet (500 mg total) by mouth every 8 (eight) hours as needed for muscle spasms.   multivitamin tablet Take 1 tablet by mouth daily.   ondansetron 4 MG tablet Commonly known as: Zofran Take 1 tablet (4 mg total) by mouth every 8 (eight) hours as needed for nausea or vomiting.   oxyCODONE 5 MG immediate release tablet Commonly known as: Oxy IR/ROXICODONE Take 1 tablet (5 mg total) by mouth every 6 (six) hours as needed for moderate pain (pain score 4-6). What changed: when to take this   polyethylene glycol powder 17 GM/SCOOP powder Commonly known as: GLYCOLAX/MIRALAX Take 17 g by mouth daily.   Senna-S 8.6-50 MG tablet Generic drug: senna-docusate Take 2 tablets by mouth at  bedtime as needed for mild constipation.   tiZANidine 4 MG tablet Commonly known as: ZANAFLEX Take 4 mg by mouth every 8 (eight) hours as needed for muscle spasms.        Follow-up Information     [facility] Follow up.   Specialty: Radiology Why: Drain follow up in 10-14 days after discharge. Our schedulers will call you to set up the appointment. If you have questions regarding your appointment, please call [phone]. Contact information: [address] [place] [address] [phone]        Lysle Rubens, MD Follow up.   Specialty: General Surgery Why: call as needed to follow up with your surgeon. Contact information: [address] [place] [address] [phone]         Norm Salt, PA Follow up in 1 week(s).   Specialty: Physician Assistant Contact information: [address] [place] [address] [phone]                Allergies  Allergen Reactions   Amoxil [Amoxicillin] Anaphylaxis   Penicillins Anaphylaxis   Motrin [Ibuprofen] Other (See Comments)    Stomach pain after several uses of 800mg  tablets.     Other Procedures/Studies: CT GUIDED PERITONEAL/RETROPERITONEAL FLUID DRAIN BY PERC CATH  Result Date: 05/20/2023 INDICATION: 62 year old gentleman with left upper quadrant hematoma/fluid collection status post splenectomy presented to the ED with intractable abdominal pain. IR consulted for drain placement. EXAM: CT-guided left upper quadrant drain placement TECHNIQUE: Multidetector CT imaging of the abdomen was performed following the standard protocol without IV contrast. RADIATION DOSE REDUCTION: This exam was performed according to the departmental dose-optimization program which includes automated exposure control, adjustment of the mA and/or kV according to patient size and/or use of iterative reconstruction technique. MEDICATIONS: The patient is currently admitted to the hospital  and receiving intravenous antibiotics. The antibiotics were administered within an appropriate time frame prior to the initiation of the procedure. ANESTHESIA/SEDATION: Moderate (conscious) sedation was employed during this procedure. A total of Versed 4 mg and Fentanyl 100 mcg was administered intravenously by the radiology nurse. Total intra-service moderate Sedation Time: 22 minutes. The patient's level of consciousness and vital signs were monitored continuously by radiology nursing throughout the procedure under my direct supervision. COMPLICATIONS: None immediate. PROCEDURE: Informed written consent was obtained from the patient after a thorough discussion of the procedural risks, benefits and alternatives. All questions were addressed. Maximal Sterile Barrier Technique was utilized including caps, mask, sterile  gowns, sterile gloves, sterile drape, hand hygiene and skin antiseptic. A timeout was performed prior to the initiation of the procedure. Patient positioned supine on the procedure table. The left upper quadrant skin prepped and draped in the usual fashion. Following local administration, the left upper quadrant fluid collection was accessed with a 19 gauge Yueh needle utilizing CT guidance. Yueh needle was removed over 0.035 inch guidewire. Serial dilation was performed followed by placement of 12 [place] multipurpose pigtail drain. Approximately 400 mL of bloody fluid was aspirated. Samples were sent for Gram stain and culture. IMPRESSION: Successful CT-guided insertion of 12 French multipurpose pigtail drain in symptomatic left upper quadrant post splenectomy hematoma. Electronically Signed   By: Acquanetta Belling M.D.   On: 05/20/2023 15:48   CT Angio Chest/Abd/Pel for Dissection W and/or W/WO  Addendum Date: 05/20/2023   ADDENDUM REPORT: 05/20/2023 02:02 ADDENDUM: Additional history was provided in that the patient has recently undergone splenectomy via a chevron incision. In light of this, the  collection seen within the left upper quadrant represents a large hematoma within the splenic bed measuring 9.8 x 14.1 x 10.6 cm (volume = 770 cm^3) with a more hyperdense region centrally possibly representing sentinel clot or hemostatic packing material. There are punctate foci of gas within the hematoma at the superior margin though this is nonspecific and may simply represent gas related to open splenectomy. Super infection, however, is not excluded on this examination alone and correlation with the patient's clinical picture is recommended. If indicated, diagnostic sampling of the hematoma for microbial analysis could be considered. Electronically Signed   By: Helyn Numbers M.D.   On: 05/20/2023 02:02   Result Date: 05/20/2023 CLINICAL DATA:  Acute aortic syndrome, abdominal pain, diaphoresis. 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 Multidetector CT imaging through the chest, abdomen and pelvis was performed using the standard protocol during bolus administration of intravenous contrast. Multiplanar reconstructed images and MIPs were obtained and reviewed to evaluate the vascular anatomy. RADIATION DOSE REDUCTION: This exam was performed according to the departmental dose-optimization program which includes automated exposure control, adjustment of the mA and/or kV according to patient size and/or use of iterative reconstruction technique. CONTRAST:  OMNIPAQUE IOHEXOL 350 MG/ML SOLN COMPARISON:  04/20/2023. FINDINGS: CTA CHEST FINDINGS Cardiovascular: Preferential opacification of the thoracic aorta. No evidence of thoracic aortic aneurysm or dissection. Normal heart size. No pericardial effusion. Mediastinum/Nodes: No enlarged mediastinal, hilar, or axillary lymph nodes. Thyroid gland, trachea, and esophagus demonstrate no significant findings. Lungs/Pleura:  Mild emphysema. No confluent pulmonary infiltrate. No pneumothorax or pleural effusion. No central obstructing lesion. Musculoskeletal: No chest wall abnormality. No acute or significant osseous findings. Review of the MIP images confirms the above findings. CTA ABDOMEN AND PELVIS FINDINGS VASCULAR Aorta: Normal caliber aorta without aneurysm, dissection, vasculitis or significant stenosis. Celiac: Since the prior examination, the pseudoaneurysm within the splenic hilum has thrombosed. Additionally, there is markedly 2 decreased caliber of the splenic artery diffusely which may relate to interval near complete infarction of the spleen described below. The celiac axis itself is widely patent, as is the left gastric artery and common hepatic artery. No aneurysm or dissection. SMA: Patent without evidence of aneurysm, dissection, vasculitis or significant stenosis. Renals: Main renal arteries bilaterally are widely patent and demonstrate normal vascular morphology. Tiny bilateral accessory lower pole renal arteries are noted. IMA: Patent without evidence of aneurysm, dissection, vasculitis  or significant stenosis. Inflow: Patent without evidence of aneurysm, dissection, vasculitis or significant stenosis. Veins: No obvious venous abnormality within the limitations of this arterial phase study. Review of the MIP images confirms the above findings. NON-VASCULAR Hepatobiliary: No focal liver abnormality is seen. No gallstones, gallbladder wall thickening, or biliary dilatation. Pancreas: Punctate calcifications within the pancreatic tail, head, and uncinate process are identified in keeping with changes of chronic pancreatitis. No superimposed acute peripancreatic inflammatory changes or fluid collections are identified. Pancreatic duct is not dilated. 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 Small amount of residual enhancing tissue is seen within the splenic hilum. Adrenals/Urinary Tract: Adrenal glands  are unremarkable. Kidneys are normal, without renal calculi, focal lesion, or hydronephrosis. Bladder is unremarkable. Stomach/Bowel: Interval development of mildly hyperdense fluid throughout the abdomen in keeping with mild hemoperitoneum. This is of relatively low attenuation, however (16-18 Hounsfield units) suggesting that this may be subacute in nature or reflect underlying anemia. The stomach, small bowel, and large bowel are unremarkable. Appendix normal. No free intraperitoneal gas. Lymphatic: No pathologic adenopathy within the abdomen and pelvis. Reproductive: Prostate is unremarkable. Other: No abdominal wall hernia Musculoskeletal: Degenerative changes are seen at L1-2. No acute bone abnormality. No lytic or blastic bone lesion. Review of the MIP images confirms the above findings. IMPRESSION: 1. 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 2. Interval development of mild hemoperitoneum throughout the abdomen. This is of relatively low attenuation, however, suggesting that this may be subacute in nature or reflect underlying anemia. 3. No evidence of thoracic or abdominal aortic aneurysm or dissection. 4. Changes of chronic pancreatitis. No superimposed acute peripancreatic inflammatory changes or fluid collections are identified. Emphysema (ICD10-J43.9). Electronically Signed: By: Helyn Numbers M.D. On: 05/20/2023 01:03   DG Chest Port 1 View  Result Date: 05/19/2023 CLINICAL DATA:  Chest and abdominal pain EXAM: PORTABLE CHEST 1 VIEW COMPARISON:  04/22/2023 FINDINGS: Single frontal view of the chest demonstrates a stable cardiac silhouette. Chronic elevation of the left hemidiaphragm. No acute airspace disease, effusion, or pneumothorax. No acute bony abnormality. IMPRESSION: 1. Stable chest, no acute process. Electronically Signed   By: Sharlet Salina M.D.   On: 05/19/2023 23:52     TODAY-DAY OF DISCHARGE:  Subjective:    Colin Rhein today has no headache,no chest abdominal pain,no new weakness tingling or numbness, feels much better wants to go home today.   Objective:   Blood pressure 123/67, pulse 70, temperature 98.3 F (36.8 C), temperature source Oral, resp. rate 18, height 6' (1.829 m), weight 70.3 kg, SpO2 95%.  Intake/Output Summary (Last 24 hours) at 05/23/2023 1330 Last data filed at 05/23/2023 0648 Gross per 24 hour  Intake 1060 ml  Output 295 ml  Net 765 ml   Filed Weights   05/20/23 0144  Weight: 70.3 kg    Exam: Awake Alert, Oriented *3, No new F.N deficits, Normal affect [place].AT,PERRAL Supple Neck,No JVD, No cervical lymphadenopathy appriciated.  Symmetrical Chest wall movement, Good air movement bilaterally, CTAB RRR,No Gallops,Rubs or new Murmurs, No Parasternal Heave +ve B.Sounds, Abd Soft, Non tender, No organomegaly appriciated, No rebound -guarding or rigidity. No Cyanosis, Clubbing or edema, No new Rash or bruise   PERTINENT RADIOLOGIC STUDIES: No results found.   PERTINENT LAB RESULTS: CBC: Recent Labs    05/22/23 0415 05/23/23 0348  WBC 15.6* 11.4*  HGB 7.9* 8.4*  HCT 25.3* 26.5*  PLT 353 452*   CMET CMP  Component Value Date/Time   NA 127 (L) 05/23/2023 0348   K 3.8 05/23/2023 0348   CL 89 (L) 05/23/2023 0348   CO2 29 05/23/2023 0348   GLUCOSE 99 05/23/2023 0348   BUN <5 (L) 05/23/2023 0348   CREATININE 0.48 (L) 05/23/2023 0348   CALCIUM 8.7 (L) 05/23/2023 0348   PROT 6.5 05/19/2023 2338   ALBUMIN 2.9 (L) 05/19/2023 2338   AST 42 (H) 05/19/2023 2338   ALT 32 05/19/2023 2338   ALKPHOS 93 05/19/2023 2338   BILITOT 0.8 05/19/2023 2338   GFRNONAA >60 05/23/2023 0348    GFR Estimated Creatinine Clearance: 97.6 mL/min (A) (by C-G formula based on SCr of 0.48 mg/dL (L)). No results for input(s): "LIPASE", "AMYLASE" in the last 72 hours. No results for input(s): "CKTOTAL", "CKMB", "CKMBINDEX", "TROPONINI" in the last 72 hours. Invalid  input(s): "POCBNP" No results for input(s): "DDIMER" in the last 72 hours. No results for input(s): "HGBA1C" in the last 72 hours. No results for input(s): "CHOL", "HDL", "LDLCALC", "TRIG", "CHOLHDL", "LDLDIRECT" in the last 72 hours. No results for input(s): "TSH", "T4TOTAL", "T3FREE", "THYROIDAB" in the last 72 hours.  Invalid input(s): "FREET3" No results for input(s): "VITAMINB12", "FOLATE", "FERRITIN", "TIBC", "IRON", "RETICCTPCT" in the last 72 hours.  Coags: No results for input(s): "INR" in the last 72 hours.  Invalid input(s): "PT" Microbiology: Recent Results (from the past 240 hour(s))  Culture, blood (Routine X 2) w Reflex to ID Panel     Status: None (Preliminary result)   Collection Time: 05/20/23  6:37 AM   Specimen: BLOOD RIGHT ARM  Result Value Ref Range Status   Specimen Description BLOOD RIGHT ARM  Final   Special Requests   Final    BOTTLES DRAWN AEROBIC AND ANAEROBIC Blood Culture results may not be optimal due to an excessive volume of blood received in culture bottles   Culture   Final    NO GROWTH 3 DAYS Performed at [facility], [address], [place], [place] [address]    Report Status PENDING  Incomplete  Culture, blood (Routine X 2) w Reflex to ID Panel     Status: None (Preliminary result)   Collection Time: 05/20/23  6:37 AM   Specimen: BLOOD RIGHT HAND  Result Value Ref Range Status   Specimen Description BLOOD RIGHT HAND  Final   Special Requests   Final    BOTTLES DRAWN AEROBIC AND ANAEROBIC Blood Culture results may not be optimal due to an excessive volume of blood received in culture bottles   Culture   Final    NO GROWTH 3 DAYS Performed at [facility], [address], [place], [place] [address]    Report Status PENDING  Incomplete  Aerobic/Anaerobic Culture w Gram Stain (surgical/deep wound)     Status: None (Preliminary result)   Collection Time: 05/20/23  2:58 PM   Specimen: Abdomen; Abscess  Result Value Ref Range  Status   Specimen Description ABDOMEN ABSCESS  Final   Special Requests NONE  Final   Gram Stain NO WBC SEEN NO ORGANISMS SEEN   Final   Culture   Final    NO GROWTH 2 DAYS NO ANAEROBES ISOLATED; CULTURE IN PROGRESS FOR 5 DAYS Performed at [facility], [address], [place], [place] [address]    Report Status PENDING  Incomplete    FURTHER DISCHARGE INSTRUCTIONS:  Get Medicines reviewed and adjusted: Please take all your medications with you for your next visit with  your Primary MD  Laboratory/radiological data: Please request your Primary MD to go over all hospital tests and procedure/radiological results at the follow up, please ask your Primary MD to get all Hospital records sent to his/her office.  In some cases, they will be blood work, cultures and biopsy results pending at the time of your discharge. Please request that your primary care M.D. goes through all the records of your hospital data and follows up on these results.  Also Note the following: If you experience worsening of your admission symptoms, develop shortness of breath, life threatening emergency, suicidal or homicidal thoughts you must seek medical attention immediately by calling 911 or calling your MD immediately  if symptoms less severe.  You must read complete instructions/literature along with all the possible adverse reactions/side effects for all the Medicines you take and that have been prescribed to you. Take any new Medicines after you have completely understood and accpet all the possible adverse reactions/side effects.   Do not drive when taking Pain medications or sleeping medications (Benzodaizepines)  Do not take more than prescribed Pain, Sleep and Anxiety Medications. It is not advisable to combine anxiety,sleep and pain medications without talking with your primary care practitioner  Special Instructions: If you have smoked or chewed Tobacco  in the last 2 yrs please stop smoking, stop  any regular Alcohol  and or any Recreational drug use.  Wear Seat belts while driving.  Please note: You were cared for by a hospitalist during your hospital stay. Once you are discharged, your primary care physician will handle any further medical issues. Please note that NO REFILLS for any discharge medications will be authorized once you are discharged, as it is imperative that you return to your primary care physician (or establish a relationship with a primary care physician if you do not have one) for your post hospital discharge needs so that they can reassess your need for medications and monitor your lab values.  Total Time spent coordinating discharge including counseling, education and face to face time equals greater than 30 minutes.  Signed: Dannika Hilgeman 05/23/2023 1:30 PM

## 2023-05-24 LAB — TYPE AND SCREEN
ABO/RH(D): O POS
Antibody Screen: NEGATIVE
Unit division: 0
Unit division: 0

## 2023-05-24 LAB — BPAM RBC
Blood Product Expiration Date: 202411272359
Blood Product Expiration Date: 202411272359
Unit Type and Rh: 5100
Unit Type and Rh: 5100

## 2023-05-25 ENCOUNTER — Other Ambulatory Visit: Payer: Self-pay | Admitting: General Surgery

## 2023-05-25 DIAGNOSIS — S301XXA Contusion of abdominal wall, initial encounter: Secondary | ICD-10-CM

## 2023-05-25 LAB — CULTURE, BLOOD (ROUTINE X 2)
Culture: NO GROWTH
Culture: NO GROWTH

## 2023-05-25 LAB — AEROBIC/ANAEROBIC CULTURE W GRAM STAIN (SURGICAL/DEEP WOUND)
Culture: NO GROWTH
Gram Stain: NONE SEEN

## 2023-05-31 ENCOUNTER — Inpatient Hospital Stay (HOSPITAL_BASED_OUTPATIENT_CLINIC_OR_DEPARTMENT_OTHER)
Admission: EM | Admit: 2023-05-31 | Discharge: 2023-06-03 | DRG: 919 | Disposition: A | Discharge status: Home / Self Care | Payer: 59 | Attending: Internal Medicine | Admitting: Internal Medicine

## 2023-05-31 ENCOUNTER — Emergency Department (HOSPITAL_BASED_OUTPATIENT_CLINIC_OR_DEPARTMENT_OTHER): Payer: 59

## 2023-05-31 ENCOUNTER — Emergency Department (HOSPITAL_BASED_OUTPATIENT_CLINIC_OR_DEPARTMENT_OTHER): Payer: 59 | Admitting: Radiology

## 2023-05-31 ENCOUNTER — Other Ambulatory Visit: Payer: Self-pay

## 2023-05-31 DIAGNOSIS — Z7984 Long term (current) use of oral hypoglycemic drugs: Secondary | ICD-10-CM

## 2023-05-31 DIAGNOSIS — R079 Chest pain, unspecified: Secondary | ICD-10-CM | POA: Diagnosis not present

## 2023-05-31 DIAGNOSIS — D75839 Thrombocytosis, unspecified: Secondary | ICD-10-CM | POA: Diagnosis present

## 2023-05-31 DIAGNOSIS — F1010 Alcohol abuse, uncomplicated: Secondary | ICD-10-CM | POA: Diagnosis present

## 2023-05-31 DIAGNOSIS — E222 Syndrome of inappropriate secretion of antidiuretic hormone: Secondary | ICD-10-CM | POA: Diagnosis present

## 2023-05-31 DIAGNOSIS — D7831 Postprocedural hematoma of the spleen following a procedure on the spleen: Principal | ICD-10-CM | POA: Diagnosis present

## 2023-05-31 DIAGNOSIS — E440 Moderate protein-calorie malnutrition: Secondary | ICD-10-CM | POA: Diagnosis present

## 2023-05-31 DIAGNOSIS — Z6820 Body mass index (BMI) 20.0-20.9, adult: Secondary | ICD-10-CM

## 2023-05-31 DIAGNOSIS — Z880 Allergy status to penicillin: Secondary | ICD-10-CM

## 2023-05-31 DIAGNOSIS — R6510 Systemic inflammatory response syndrome (SIRS) of non-infectious origin without acute organ dysfunction: Secondary | ICD-10-CM | POA: Diagnosis present

## 2023-05-31 DIAGNOSIS — F17210 Nicotine dependence, cigarettes, uncomplicated: Secondary | ICD-10-CM | POA: Diagnosis present

## 2023-05-31 DIAGNOSIS — Y828 Other medical devices associated with adverse incidents: Secondary | ICD-10-CM | POA: Diagnosis present

## 2023-05-31 DIAGNOSIS — F1011 Alcohol abuse, in remission: Secondary | ICD-10-CM | POA: Diagnosis present

## 2023-05-31 DIAGNOSIS — K852 Alcohol induced acute pancreatitis without necrosis or infection: Secondary | ICD-10-CM | POA: Diagnosis present

## 2023-05-31 DIAGNOSIS — Z9081 Acquired absence of spleen: Secondary | ICD-10-CM

## 2023-05-31 DIAGNOSIS — E785 Hyperlipidemia, unspecified: Secondary | ICD-10-CM | POA: Diagnosis present

## 2023-05-31 DIAGNOSIS — K863 Pseudocyst of pancreas: Secondary | ICD-10-CM | POA: Diagnosis present

## 2023-05-31 DIAGNOSIS — S3011XA Contusion of abdominal wall, initial encounter: Secondary | ICD-10-CM | POA: Diagnosis present

## 2023-05-31 DIAGNOSIS — S301XXA Contusion of abdominal wall, initial encounter: Secondary | ICD-10-CM | POA: Diagnosis present

## 2023-05-31 DIAGNOSIS — G8929 Other chronic pain: Secondary | ICD-10-CM | POA: Diagnosis present

## 2023-05-31 DIAGNOSIS — D6489 Other specified anemias: Secondary | ICD-10-CM | POA: Diagnosis present

## 2023-05-31 DIAGNOSIS — D75838 Other thrombocytosis: Secondary | ICD-10-CM | POA: Diagnosis present

## 2023-05-31 DIAGNOSIS — Y838 Other surgical procedures as the cause of abnormal reaction of the patient, or of later complication, without mention of misadventure at the time of the procedure: Secondary | ICD-10-CM | POA: Diagnosis present

## 2023-05-31 DIAGNOSIS — I10 Essential (primary) hypertension: Secondary | ICD-10-CM | POA: Diagnosis present

## 2023-05-31 DIAGNOSIS — Z881 Allergy status to other antibiotic agents status: Secondary | ICD-10-CM

## 2023-05-31 DIAGNOSIS — K861 Other chronic pancreatitis: Secondary | ICD-10-CM | POA: Diagnosis present

## 2023-05-31 DIAGNOSIS — D649 Anemia, unspecified: Secondary | ICD-10-CM | POA: Diagnosis present

## 2023-05-31 DIAGNOSIS — E119 Type 2 diabetes mellitus without complications: Secondary | ICD-10-CM

## 2023-05-31 DIAGNOSIS — K859 Acute pancreatitis without necrosis or infection, unspecified: Principal | ICD-10-CM | POA: Diagnosis present

## 2023-05-31 DIAGNOSIS — Z823 Family history of stroke: Secondary | ICD-10-CM

## 2023-05-31 DIAGNOSIS — Z79899 Other long term (current) drug therapy: Secondary | ICD-10-CM

## 2023-05-31 DIAGNOSIS — E871 Hypo-osmolality and hyponatremia: Secondary | ICD-10-CM | POA: Diagnosis present

## 2023-05-31 DIAGNOSIS — E7800 Pure hypercholesterolemia, unspecified: Secondary | ICD-10-CM | POA: Diagnosis present

## 2023-05-31 DIAGNOSIS — M549 Dorsalgia, unspecified: Secondary | ICD-10-CM | POA: Diagnosis present

## 2023-05-31 DIAGNOSIS — R188 Other ascites: Secondary | ICD-10-CM | POA: Diagnosis present

## 2023-05-31 DIAGNOSIS — Z720 Tobacco use: Secondary | ICD-10-CM | POA: Diagnosis present

## 2023-05-31 DIAGNOSIS — Z886 Allergy status to analgesic agent status: Secondary | ICD-10-CM

## 2023-05-31 LAB — COMPREHENSIVE METABOLIC PANEL
ALT: 23 U/L (ref 0–44)
AST: 24 U/L (ref 15–41)
Albumin: 3.4 g/dL — ABNORMAL LOW (ref 3.5–5.0)
Alkaline Phosphatase: 95 U/L (ref 38–126)
Anion gap: 12 (ref 5–15)
BUN: 10 mg/dL (ref 6–20)
CO2: 23 mmol/L (ref 22–32)
Calcium: 9.3 mg/dL (ref 8.9–10.3)
Chloride: 97 mmol/L — ABNORMAL LOW (ref 98–111)
Creatinine, Ser: 0.46 mg/dL — ABNORMAL LOW (ref 0.61–1.24)
GFR, Estimated: >60 mL/min (ref >60)
Glucose, Bld: 116 mg/dL — ABNORMAL HIGH (ref 70–99)
Potassium: 3.9 mmol/L (ref 3.5–5.1)
Sodium: 132 mmol/L — ABNORMAL LOW (ref 135–145)
Total Bilirubin: 0.5 mg/dL (ref <1.2)
Total Protein: 6.7 g/dL (ref 6.5–8.1)

## 2023-05-31 LAB — CBC WITH DIFFERENTIAL/PLATELET
Abs Immature Granulocytes: 0.08 10*3/uL — ABNORMAL HIGH (ref 0.00–0.07)
Basophils Absolute: 0.1 10*3/uL (ref 0.0–0.1)
Basophils Relative: 0 %
Eosinophils Absolute: 0.1 10*3/uL (ref 0.0–0.5)
Eosinophils Relative: 1 %
HCT: 29.5 % — ABNORMAL LOW (ref 39.0–52.0)
Hemoglobin: 9.5 g/dL — ABNORMAL LOW (ref 13.0–17.0)
Immature Granulocytes: 0 %
Lymphocytes Relative: 15 %
Lymphs Abs: 2.7 10*3/uL (ref 0.7–4.0)
MCH: 27.1 pg (ref 26.0–34.0)
MCHC: 32.2 g/dL (ref 30.0–36.0)
MCV: 84 fL (ref 80.0–100.0)
Monocytes Absolute: 1.6 10*3/uL — ABNORMAL HIGH (ref 0.1–1.0)
Monocytes Relative: 9 %
Neutro Abs: 13.4 10*3/uL — ABNORMAL HIGH (ref 1.7–7.7)
Neutrophils Relative %: 75 %
Platelets: 868 10*3/uL — ABNORMAL HIGH (ref 150–400)
RBC: 3.51 MIL/uL — ABNORMAL LOW (ref 4.22–5.81)
RDW: 15.2 % (ref 11.5–15.5)
WBC: 18 10*3/uL — ABNORMAL HIGH (ref 4.0–10.5)
nRBC: 0 % (ref 0.0–0.2)

## 2023-05-31 LAB — LACTIC ACID, PLASMA: Lactic Acid, Venous: 1.1 mmol/L (ref 0.5–1.9)

## 2023-05-31 LAB — TROPONIN I (HIGH SENSITIVITY)
Troponin I (High Sensitivity): 3 ng/L (ref <18)
Troponin I (High Sensitivity): 3 ng/L (ref <18)

## 2023-05-31 LAB — LIPASE, BLOOD: Lipase: 182 U/L — ABNORMAL HIGH (ref 11–51)

## 2023-05-31 MED ORDER — HYDROMORPHONE HCL 1 MG/ML IJ SOLN
1.0000 mg | Freq: Once | INTRAMUSCULAR | Status: AC
  Administered 2023-06-01: 1 mg via INTRAVENOUS
  Filled 2023-05-31: qty 1

## 2023-05-31 MED ORDER — HYDROMORPHONE HCL 1 MG/ML IJ SOLN
1.0000 mg | Freq: Once | INTRAMUSCULAR | Status: AC
  Administered 2023-05-31: 1 mg via INTRAVENOUS
  Filled 2023-05-31: qty 1

## 2023-05-31 MED ORDER — IOHEXOL 300 MG/ML  SOLN
100.0000 mL | Freq: Once | INTRAMUSCULAR | Status: AC | PRN
  Administered 2023-05-31: 80 mL via INTRAVENOUS

## 2023-05-31 MED ORDER — ONDANSETRON HCL 4 MG/2ML IJ SOLN
4.0000 mg | Freq: Once | INTRAMUSCULAR | Status: AC
  Administered 2023-05-31: 4 mg via INTRAVENOUS
  Filled 2023-05-31: qty 2

## 2023-05-31 NOTE — ED Notes (Signed)
Additional labs sent: culture set, blue, gold.

## 2023-05-31 NOTE — ED Provider Notes (Signed)
[facility] AT [facility] Provider Note   CSN: [id number] Arrival date & time: 05/31/23  1819     History {Add pertinent medical, surgical, social history, OB history to HPI:1} Chief Complaint  Patient presents with   Chest Pain    Samuel Abbott is a 62 y.o. male.  62 year old male presenting emergency department with pain across upper abdomen, as well as upper back and left flank.  Reports symptoms have been ongoing since hospitalization recently.  Was somewhat improved after discharge, but has been worsening.  No shortness of breath.  Has reported fevers at home.  He is nauseated, but no vomiting.   Chest Pain      Home Medications Prior to Admission medications   Medication Sig Start Date End Date Taking? Authorizing Provider  acetaminophen (TYLENOL) 500 MG tablet Take 2 tablets (1,000 mg total) by mouth every 6 (six) hours as needed for mild pain, moderate pain, fever or headache. 04/23/23   Barnetta Chapel, PA-C  ALPRAZolam Prudy Feeler) 0.25 MG tablet Take 1 tablet by mouth 2 (two) times daily as needed for anxiety.    [provider]  DULoxetine (CYMBALTA) 20 MG capsule Take 20 mg by mouth daily. For back pain 01/13/21   [provider]  fenofibrate (TRICOR) 145 MG tablet Take 1 tablet by mouth daily. Patient not taking: Reported on 05/20/2023    [provider]  losartan (COZAAR) 25 MG tablet Take 25 mg by mouth daily. 09/16/20   [provider]  metFORMIN (GLUCOPHAGE) 500 MG tablet Take 1 tablet (500 mg total) by mouth daily with breakfast. Patient taking differently: Take 250 mg by mouth 2 (two) times daily. 01/24/21 06/19/23  Maness, Loistine Chance, MD  methocarbamol (ROBAXIN) 500 MG tablet Take 1 tablet (500 mg total) by mouth every 8 (eight) hours as needed for muscle spasms. 04/23/23   Barnetta Chapel, PA-C  Multiple Vitamin (MULTIVITAMIN) tablet Take 1 tablet by mouth daily.    [provider]  ondansetron  (ZOFRAN) 4 MG tablet Take 1 tablet (4 mg total) by mouth every 8 (eight) hours as needed for nausea or vomiting. 04/23/23   Leroy Sea, MD  oxyCODONE (OXY IR/ROXICODONE) 5 MG immediate release tablet Take 1 tablet (5 mg total) by mouth every 6 (six) hours as needed for moderate pain (pain score 4-6). 05/23/23   Ghimire, Werner Lean, MD  polyethylene glycol powder (GLYCOLAX/MIRALAX) 17 GM/SCOOP powder Take 17 g by mouth daily. 04/23/23   Leroy Sea, MD  senna-docusate (SENOKOT-S) 8.6-50 MG tablet Take 2 tablets by mouth at bedtime as needed for mild constipation. 04/23/23   Leroy Sea, MD  tiZANidine (ZANAFLEX) 4 MG tablet Take 4 mg by mouth every 8 (eight) hours as needed for muscle spasms. 05/10/23   [provider]      Allergies    Amoxil [amoxicillin], Penicillins, and Motrin [ibuprofen]    Review of Systems   Review of Systems  Cardiovascular:  Positive for chest pain.    Physical Exam Updated Vital Signs BP 124/72   Pulse 93   Temp 99.1 F (37.3 C) (Oral)   Resp 19   Wt 70 kg   SpO2 96%   BMI 20.93 kg/m  Physical Exam Vitals and nursing note reviewed.  Constitutional:      General: He is not in acute distress.    Appearance: He is not toxic-appearing.  HENT:     Head: Normocephalic.  Cardiovascular:     Rate and  Rhythm: Normal rate and regular rhythm.     Pulses:          Radial pulses are 2+ on the right side and 2+ on the left side.     Heart sounds: Normal heart sounds.  Pulmonary:     Breath sounds: Normal breath sounds.  Abdominal:     Tenderness: There is abdominal tenderness.  Musculoskeletal:        General: Normal range of motion.  Skin:    General: Skin is warm and dry.     Capillary Refill: Capillary refill takes less than 2 seconds.  Neurological:     General: No focal deficit present.     Mental Status: He is alert.  Psychiatric:        Mood and Affect: Mood normal.        Behavior: Behavior normal.     ED Results  / Procedures / Treatments   Labs (all labs ordered are listed, but only abnormal results are displayed) Labs Reviewed  COMPREHENSIVE METABOLIC PANEL - Abnormal; Notable for the following components:      Result Value   Sodium 132 (*)    Chloride 97 (*)    Glucose, Bld 116 (*)    Creatinine, Ser 0.46 (*)    Albumin 3.4 (*)    All other components within normal limits  CBC WITH DIFFERENTIAL/PLATELET - Abnormal; Notable for the following components:   WBC 18.0 (*)    RBC 3.51 (*)    Hemoglobin 9.5 (*)    HCT 29.5 (*)    Platelets 868 (*)    Neutro Abs 13.4 (*)    Monocytes Absolute 1.6 (*)    Abs Immature Granulocytes 0.08 (*)    All other components within normal limits  LACTIC ACID, PLASMA  LACTIC ACID, PLASMA  URINALYSIS, W/ REFLEX TO CULTURE (INFECTION SUSPECTED)  LIPASE, BLOOD  TROPONIN I (HIGH SENSITIVITY)  TROPONIN I (HIGH SENSITIVITY)    EKG None  Radiology CT ABDOMEN PELVIS W CONTRAST  Result Date: 05/31/2023 CLINICAL DATA:  Abdominal pain, chest pain, fever EXAM: CT ABDOMEN AND PELVIS WITH CONTRAST TECHNIQUE: Multidetector CT imaging of the abdomen and pelvis was performed using the standard protocol following bolus administration of intravenous contrast. RADIATION DOSE REDUCTION: This exam was performed according to the departmental dose-optimization program which includes automated exposure control, adjustment of the mA and/or kV according to patient size and/or use of iterative reconstruction technique. CONTRAST:  80mL OMNIPAQUE IOHEXOL 300 MG/ML  SOLN COMPARISON:  05/20/2023 FINDINGS: Lower chest: Trace left pleural effusion. Left base atelectasis. Findings stable since prior study. Hepatobiliary: No focal hepatic abnormality. Gallbladder unremarkable. Pancreas: Calcifications in the pancreatic head. Stranding around the pancreas, new since prior study, best seen around the body and tail suspicious for acute pancreatitis. Fluid also noted tracking along the greater  curvature of the stomach with small focal fluid collection posterior to the stomach measuring 4.2 x 1.5 cm. Spleen: Reported prior splenectomy. There appears to be some residual splenic tissue. Large fluid collection in the left upper quadrant measuring 13.5 x 9.8 cm. Previously seen pigtail drainage catheter within this fluid collection no longer visualized. Adrenals/Urinary Tract: No adrenal abnormality. No focal renal abnormality. No stones or hydronephrosis. Urinary bladder is unremarkable. Stomach/Bowel: Mild wall thickening of along the greater curvature of the stomach, likely secondary inflammation related to inflamed pancreas. Normal appendix. Vascular/Lymphatic: No evidence of aneurysm or adenopathy. Reproductive: No visible focal abnormality. Other: No free fluid or free air. Musculoskeletal: No acute  bony abnormality. IMPRESSION: Persistent large left upper quadrant fluid collection measuring 13.5 x 9.8 cm. Previously placed pigtail drainage catheter no longer visualized within the fluid collection. Stranding/inflammation around the pancreatic body and tail concerning for acute pancreatitis. Secondary wall thickening/inflammation along the greater curvature of the stomach with 4.2 x 1.5 cm fluid collection posterior to the gastric wall. Trace left pleural effusion, left base atelectasis. Electronically Signed   By: Charlett Nose M.D.   On: 05/31/2023 23:38   DG Chest Port 1 View  Result Date: 05/31/2023 CLINICAL DATA:  Central chest pain cough, fever. EXAM: PORTABLE CHEST 1 VIEW COMPARISON:  05/20/2023. FINDINGS: The heart size and mediastinal contours are within normal limits. Mild atelectasis or scarring is noted at the left lung base. No effusion or pneumothorax is seen. No acute osseous abnormality. IMPRESSION: Mild atelectasis or scarring at the left lung base. Electronically Signed   By: Thornell Sartorius M.D.   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  Cultures negative so far as well.  Underwent CT-guided drain placement by IR-since cultures continue be negative-IR planning to remove drain today following which he will be discharged home.  Leukocytosis continue to downtrend.  Plan is to monitor off antibiotics on discharge.   Brief Summary: Patient is a 62 y.o.  male with history of recent splenectomy for a spontaneous splenic hematoma-presenting with subjective fever/abdominal pain-CT abdomen/pelvis showed a large hematoma in the splenic bed-evaluated by IR/CCS-underwent CT-guided drain placement on 11/7.     Significant events: 11/6>> admit to [facility]   Significant studies: 11/7>> CT angiography chest/abdomen/pelvis: Large hematoma in the splenic bed.   Significant microbiology data: 11/7>> blood culture: No growth 11/7>> hematoma splenic bed culture: No growth   Procedures: 11/7>> CT-guided drain placement-left upper quadrant hematoma   Consults: General surgery IR " [TY]  2257 CBC with Differential(!) Persistent leukocytosis slightly elevated compared to when he was discharged.  Hemoglobin appears stable. [TY]  2258 Lactic Acid, Venous: 1.1 [TY]  2258 Comprehensive metabolic panel(!) [TY]  2259 Troponin I (High Sensitivity): 3 ACS less likely [TY]  2259 DG Chest Port 1  View IMPRESSION: Mild atelectasis or scarring at the left lung base.   [TY]  2347 CT ABDOMEN PELVIS W CONTRAST IMPRESSION: Persistent large left upper quadrant fluid collection measuring 13.5 x 9.8 cm. Previously placed pigtail drainage catheter no longer visualized within the fluid collection.  Stranding/inflammation around the pancreatic body and tail concerning for acute pancreatitis. Secondary wall thickening/inflammation along the greater curvature of the stomach with 4.2 x 1.5 cm fluid collection posterior to the gastric wall.  Trace left pleural effusion, left base atelectasis.   [TY]    Clinical Course User Index [TY] Coral Spikes, DO   {   Click here for ABCD2, HEART and other calculatorsREFRESH Note before signing :1}  Medical Decision Making Is a 62 year old male with complex past medical history to include pancreatitis alcohol abuse hypertension diabetes.  Recently hospitalized for splenic hematoma with pigtail drain placement.  Continue to have pain.  Is afebrile vital signs reassuring.  Does have diffusely tender abdomen.  Labs as noted in ED course.  CT scan persistent fluid collection.  Lipase pending.  Amount and/or Complexity of Data Reviewed Labs: ordered. Decision-making details documented in ED Course. Radiology: ordered. Decision-making details documented in ED Course.  Risk Prescription drug management.   ***  {Document critical care time when appropriate:1} {Document review of labs and clinical decision tools ie heart score, Chads2Vasc2 etc:1}  {Document your independent review of radiology images, and any outside records:1} {Document your discussion with family members, caretakers, and with consultants:1} {Document social determinants of health affecting pt's care:1} {Document your decision making why or why not admission, treatments were needed:1} Final Clinical Impression(s) / ED Diagnoses Final diagnoses:  None     Rx / DC Orders ED Discharge Orders     None

## 2023-05-31 NOTE — ED Triage Notes (Addendum)
Central cp. Starting last night. Dull pressure. Nothing palliates. Radiating into back both sides. 101 fever today.  +N/-V.  Admit several times recently post spleen removal with complications.   2 weeks ago was last invasive procedure-left side of torso- pain here since as well.

## 2023-05-31 NOTE — ED Notes (Signed)
Pt requesting pain medication, EDP notified.

## 2023-06-01 ENCOUNTER — Encounter (HOSPITAL_COMMUNITY): Payer: Self-pay | Admitting: Internal Medicine

## 2023-06-01 ENCOUNTER — Other Ambulatory Visit: Payer: Self-pay

## 2023-06-01 DIAGNOSIS — Z886 Allergy status to analgesic agent status: Secondary | ICD-10-CM | POA: Diagnosis not present

## 2023-06-01 DIAGNOSIS — F1011 Alcohol abuse, in remission: Secondary | ICD-10-CM | POA: Diagnosis present

## 2023-06-01 DIAGNOSIS — D649 Anemia, unspecified: Secondary | ICD-10-CM | POA: Diagnosis present

## 2023-06-01 DIAGNOSIS — S301XXA Contusion of abdominal wall, initial encounter: Secondary | ICD-10-CM | POA: Diagnosis not present

## 2023-06-01 DIAGNOSIS — Z823 Family history of stroke: Secondary | ICD-10-CM | POA: Diagnosis not present

## 2023-06-01 DIAGNOSIS — K863 Pseudocyst of pancreas: Secondary | ICD-10-CM | POA: Diagnosis present

## 2023-06-01 DIAGNOSIS — Y838 Other surgical procedures as the cause of abnormal reaction of the patient, or of later complication, without mention of misadventure at the time of the procedure: Secondary | ICD-10-CM | POA: Diagnosis present

## 2023-06-01 DIAGNOSIS — R188 Other ascites: Secondary | ICD-10-CM | POA: Diagnosis present

## 2023-06-01 DIAGNOSIS — D7831 Postprocedural hematoma of the spleen following a procedure on the spleen: Secondary | ICD-10-CM | POA: Diagnosis present

## 2023-06-01 DIAGNOSIS — E7800 Pure hypercholesterolemia, unspecified: Secondary | ICD-10-CM | POA: Diagnosis present

## 2023-06-01 DIAGNOSIS — F17210 Nicotine dependence, cigarettes, uncomplicated: Secondary | ICD-10-CM | POA: Diagnosis present

## 2023-06-01 DIAGNOSIS — D6489 Other specified anemias: Secondary | ICD-10-CM | POA: Diagnosis present

## 2023-06-01 DIAGNOSIS — G8929 Other chronic pain: Secondary | ICD-10-CM | POA: Diagnosis present

## 2023-06-01 DIAGNOSIS — K852 Alcohol induced acute pancreatitis without necrosis or infection: Secondary | ICD-10-CM | POA: Diagnosis present

## 2023-06-01 DIAGNOSIS — D75839 Thrombocytosis, unspecified: Secondary | ICD-10-CM | POA: Diagnosis not present

## 2023-06-01 DIAGNOSIS — D75838 Other thrombocytosis: Secondary | ICD-10-CM | POA: Diagnosis present

## 2023-06-01 DIAGNOSIS — Z7984 Long term (current) use of oral hypoglycemic drugs: Secondary | ICD-10-CM | POA: Diagnosis not present

## 2023-06-01 DIAGNOSIS — R079 Chest pain, unspecified: Secondary | ICD-10-CM | POA: Diagnosis present

## 2023-06-01 DIAGNOSIS — E440 Moderate protein-calorie malnutrition: Secondary | ICD-10-CM | POA: Diagnosis present

## 2023-06-01 DIAGNOSIS — E119 Type 2 diabetes mellitus without complications: Secondary | ICD-10-CM | POA: Diagnosis present

## 2023-06-01 DIAGNOSIS — Z79899 Other long term (current) drug therapy: Secondary | ICD-10-CM | POA: Diagnosis not present

## 2023-06-01 DIAGNOSIS — Y828 Other medical devices associated with adverse incidents: Secondary | ICD-10-CM | POA: Diagnosis present

## 2023-06-01 DIAGNOSIS — R6510 Systemic inflammatory response syndrome (SIRS) of non-infectious origin without acute organ dysfunction: Secondary | ICD-10-CM | POA: Diagnosis present

## 2023-06-01 DIAGNOSIS — Z881 Allergy status to other antibiotic agents status: Secondary | ICD-10-CM | POA: Diagnosis not present

## 2023-06-01 DIAGNOSIS — Z6820 Body mass index (BMI) 20.0-20.9, adult: Secondary | ICD-10-CM | POA: Diagnosis not present

## 2023-06-01 DIAGNOSIS — Z9081 Acquired absence of spleen: Secondary | ICD-10-CM | POA: Diagnosis not present

## 2023-06-01 DIAGNOSIS — E871 Hypo-osmolality and hyponatremia: Secondary | ICD-10-CM | POA: Diagnosis not present

## 2023-06-01 DIAGNOSIS — K859 Acute pancreatitis without necrosis or infection, unspecified: Secondary | ICD-10-CM

## 2023-06-01 DIAGNOSIS — E222 Syndrome of inappropriate secretion of antidiuretic hormone: Secondary | ICD-10-CM | POA: Diagnosis present

## 2023-06-01 DIAGNOSIS — Z880 Allergy status to penicillin: Secondary | ICD-10-CM | POA: Diagnosis not present

## 2023-06-01 DIAGNOSIS — K860 Alcohol-induced chronic pancreatitis: Secondary | ICD-10-CM | POA: Diagnosis not present

## 2023-06-01 DIAGNOSIS — M549 Dorsalgia, unspecified: Secondary | ICD-10-CM | POA: Diagnosis present

## 2023-06-01 DIAGNOSIS — I10 Essential (primary) hypertension: Secondary | ICD-10-CM | POA: Diagnosis present

## 2023-06-01 LAB — URINALYSIS, W/ REFLEX TO CULTURE (INFECTION SUSPECTED)
Bacteria, UA: NONE SEEN
Bilirubin Urine: NEGATIVE
Glucose, UA: NEGATIVE mg/dL
Hgb urine dipstick: NEGATIVE
Ketones, ur: NEGATIVE mg/dL
Leukocytes,Ua: NEGATIVE
Nitrite: NEGATIVE
Protein, ur: 30 mg/dL — AB
Specific Gravity, Urine: >1.046 — ABNORMAL HIGH (ref 1.005–1.030)
pH: 7 (ref 5.0–8.0)

## 2023-06-01 LAB — CBC WITH DIFFERENTIAL/PLATELET
Abs Immature Granulocytes: 0.1 10*3/uL — ABNORMAL HIGH (ref 0.00–0.07)
Basophils Absolute: 0.1 10*3/uL (ref 0.0–0.1)
Basophils Relative: 0 %
Eosinophils Absolute: 0.1 10*3/uL (ref 0.0–0.5)
Eosinophils Relative: 1 %
HCT: 26.8 % — ABNORMAL LOW (ref 39.0–52.0)
Hemoglobin: 8.5 g/dL — ABNORMAL LOW (ref 13.0–17.0)
Immature Granulocytes: 1 %
Lymphocytes Relative: 15 %
Lymphs Abs: 2.7 10*3/uL (ref 0.7–4.0)
MCH: 27.5 pg (ref 26.0–34.0)
MCHC: 31.7 g/dL (ref 30.0–36.0)
MCV: 86.7 fL (ref 80.0–100.0)
Monocytes Absolute: 2.2 10*3/uL — ABNORMAL HIGH (ref 0.1–1.0)
Monocytes Relative: 12 %
Neutro Abs: 12.8 10*3/uL — ABNORMAL HIGH (ref 1.7–7.7)
Neutrophils Relative %: 71 %
Platelets: 813 10*3/uL — ABNORMAL HIGH (ref 150–400)
RBC: 3.09 MIL/uL — ABNORMAL LOW (ref 4.22–5.81)
RDW: 15.4 % (ref 11.5–15.5)
WBC: 18 10*3/uL — ABNORMAL HIGH (ref 4.0–10.5)
nRBC: 0 % (ref 0.0–0.2)

## 2023-06-01 LAB — COMPREHENSIVE METABOLIC PANEL
ALT: 19 U/L (ref 0–44)
AST: 15 U/L (ref 15–41)
Albumin: 2.6 g/dL — ABNORMAL LOW (ref 3.5–5.0)
Alkaline Phosphatase: 86 U/L (ref 38–126)
Anion gap: 10 (ref 5–15)
BUN: 12 mg/dL (ref 6–20)
CO2: 24 mmol/L (ref 22–32)
Calcium: 8.6 mg/dL — ABNORMAL LOW (ref 8.9–10.3)
Chloride: 95 mmol/L — ABNORMAL LOW (ref 98–111)
Creatinine, Ser: 0.43 mg/dL — ABNORMAL LOW (ref 0.61–1.24)
GFR, Estimated: >60 mL/min (ref >60)
Glucose, Bld: 104 mg/dL — ABNORMAL HIGH (ref 70–99)
Potassium: 4.4 mmol/L (ref 3.5–5.1)
Sodium: 129 mmol/L — ABNORMAL LOW (ref 135–145)
Total Bilirubin: 0.7 mg/dL (ref <1.2)
Total Protein: 6.1 g/dL — ABNORMAL LOW (ref 6.5–8.1)

## 2023-06-01 LAB — MAGNESIUM: Magnesium: 1.9 mg/dL (ref 1.7–2.4)

## 2023-06-01 LAB — GLUCOSE, CAPILLARY
Glucose-Capillary: 106 mg/dL — ABNORMAL HIGH (ref 70–99)
Glucose-Capillary: 97 mg/dL (ref 70–99)
Glucose-Capillary: 141 mg/dL — ABNORMAL HIGH (ref 70–99)

## 2023-06-01 LAB — LIPASE, BLOOD: Lipase: 68 U/L — ABNORMAL HIGH (ref 11–51)

## 2023-06-01 LAB — CBG MONITORING, ED: Glucose-Capillary: 140 mg/dL — ABNORMAL HIGH (ref 70–99)

## 2023-06-01 MED ORDER — KETOROLAC TROMETHAMINE 15 MG/ML IJ SOLN
15.0000 mg | Freq: Once | INTRAMUSCULAR | Status: AC
  Administered 2023-06-01: 15 mg via INTRAVENOUS
  Filled 2023-06-01: qty 1

## 2023-06-01 MED ORDER — ONDANSETRON HCL 4 MG/2ML IJ SOLN
4.0000 mg | Freq: Four times a day (QID) | INTRAMUSCULAR | Status: DC | PRN
  Administered 2023-06-01 – 2023-06-03 (×5): 4 mg via INTRAVENOUS
  Filled 2023-06-01 (×5): qty 2

## 2023-06-01 MED ORDER — HYDROMORPHONE HCL 1 MG/ML IJ SOLN
1.0000 mg | Freq: Once | INTRAMUSCULAR | Status: AC
  Administered 2023-06-01: 1 mg via INTRAVENOUS
  Filled 2023-06-01: qty 1

## 2023-06-01 MED ORDER — NICOTINE 21 MG/24HR TD PT24
21.0000 mg | MEDICATED_PATCH | Freq: Every day | TRANSDERMAL | Status: DC
  Administered 2023-06-01 – 2023-06-03 (×3): 21 mg via TRANSDERMAL
  Filled 2023-06-01 (×3): qty 1

## 2023-06-01 MED ORDER — NALOXONE HCL 0.4 MG/ML IJ SOLN: 0.4000 mg | INTRAMUSCULAR | Status: DC | PRN

## 2023-06-01 MED ORDER — SENNOSIDES-DOCUSATE SODIUM 8.6-50 MG PO TABS: 2.0000 | ORAL_TABLET | Freq: Every evening | ORAL | Status: DC | PRN

## 2023-06-01 MED ORDER — LOSARTAN POTASSIUM 25 MG PO TABS
25.0000 mg | ORAL_TABLET | Freq: Every day | ORAL | Status: DC
  Administered 2023-06-01 – 2023-06-03 (×3): 25 mg via ORAL
  Filled 2023-06-01 (×3): qty 1

## 2023-06-01 MED ORDER — INSULIN ASPART 100 UNIT/ML IJ SOLN
0.0000 [IU] | Freq: Four times a day (QID) | INTRAMUSCULAR | Status: DC
  Administered 2023-06-01 – 2023-06-03 (×4): 1 [IU] via SUBCUTANEOUS

## 2023-06-01 MED ORDER — HYDROMORPHONE HCL 1 MG/ML IJ SOLN
1.0000 mg | INTRAMUSCULAR | Status: DC | PRN
  Administered 2023-06-01 – 2023-06-02 (×10): 1 mg via INTRAVENOUS
  Filled 2023-06-01 (×10): qty 1

## 2023-06-01 MED ORDER — METFORMIN HCL 500 MG PO TABS: 250.0000 mg | ORAL_TABLET | Freq: Two times a day (BID) | ORAL | Status: DC

## 2023-06-01 MED ORDER — METHOCARBAMOL 500 MG PO TABS
500.0000 mg | ORAL_TABLET | Freq: Three times a day (TID) | ORAL | Status: DC | PRN
  Administered 2023-06-01 – 2023-06-02 (×2): 500 mg via ORAL
  Filled 2023-06-01 (×2): qty 1

## 2023-06-01 MED ORDER — ACETAMINOPHEN 650 MG RE SUPP
650.0000 mg | Freq: Four times a day (QID) | RECTAL | Status: DC | PRN
Start: 2023-06-01 — End: 2023-06-02

## 2023-06-01 MED ORDER — ACETAMINOPHEN 325 MG PO TABS
650.0000 mg | ORAL_TABLET | Freq: Four times a day (QID) | ORAL | Status: DC | PRN
Start: 2023-06-01 — End: 2023-06-02

## 2023-06-01 MED ORDER — DULOXETINE HCL 20 MG PO CPEP
20.0000 mg | ORAL_CAPSULE | Freq: Every day | ORAL | Status: DC
  Administered 2023-06-01 – 2023-06-03 (×3): 20 mg via ORAL
  Filled 2023-06-01 (×3): qty 1

## 2023-06-01 MED ORDER — MELATONIN 3 MG PO TABS: 3.0000 mg | ORAL_TABLET | Freq: Every evening | ORAL | Status: DC | PRN

## 2023-06-01 MED ORDER — LACTATED RINGERS IV SOLN: INTRAVENOUS | Status: DC

## 2023-06-01 MED ORDER — PANTOPRAZOLE SODIUM 40 MG IV SOLR
40.0000 mg | INTRAVENOUS | Status: DC
  Administered 2023-06-01 – 2023-06-02 (×2): 40 mg via INTRAVENOUS
  Filled 2023-06-01 (×2): qty 10

## 2023-06-01 MED ORDER — OXYCODONE HCL 5 MG PO TABS: 5.0000 mg | ORAL_TABLET | Freq: Four times a day (QID) | ORAL | Status: DC | PRN

## 2023-06-01 NOTE — H&P (Signed)
History and Physical    Patient: Samuel Abbott HYQ:[id number] DOB: 1960/10/04 DOA: 05/31/2023 DOS: the patient was seen and examined on 06/01/2023 PCP: Norm Salt, PA  Patient coming from: Home  Chief Complaint:  Chief Complaint  Patient presents with   Chest Pain   HPI: Samuel Abbott is a 62 y.o. male with medical history significant of type 2 diabetes, eczema, hyperlipidemia, hypertension, tobacco abuse, alcohol abuse who presented to the emergency department with abdominal/chest pain associated with nausea, vomiting and fever.  He was recently admitted and discharged due to abdominal pain in the setting of a splenic bed hematoma undergoing a splenectomy on 04/21/2023.  Readmitted on 05/20/2023 due to abdominal pain in the setting of LUQ fluid collection, underwent IR guided imaging and discharged on 05/23/2023.  He returned yesterday evening due to pain and switch to fever.  He stated he has not been drinking.  No diarrhea, melena or hematochezia. He denied fever, chills, rhinorrhea, sore throat, wheezing or hemoptysis.  No chest pain, palpitations, diaphoresis, PND, orthopnea or pitting edema of the lower extremities. No frequency or hematuria has been having dysuria.    Lab work: Urine analysis shows specific gravity greater than 1.046 and protein of 30 mg/dL.  Lipase was 182 units/L.  CBC showed a white count of 18.0 with 75% neutrophils, hemoglobin 9.5 g/dL platelets 952.  Lactic acid and troponin x 2 normal.  CMP showed a 732 and chloride 97 mmol/L, the rest of the electrolytes and renal function were unremarkable.  Albumin was 3.4 g/dL, LFTs otherwise normal.  Imaging: Portable 1 view chest radiograph with mild atelectasis or scarring at the left lung base.  CT abdomen and/pelvis with contrast showing a persistent large left upper quadrant fluid collection measuring 13.5 x 9.8 cm.  Previously placed pigtail drainage catheter no longer visualized within the fluid collection.   There is a stranding/inflammation around the pancreatic body and tail concerning for acute pancreatitis.  Secondary wall thickening/inflammation along the greater curvature of the stomach with a 4.2 x 1.5 cm fluid collection posterior to the gastric wall.  Trace left pleural effusion, left base atelectasis.   ED course: Initial vital signs were temperature 98.7 F, pulse 89, respiration 18, BP 114/67 mmHg O2 sat 98% on room air.  The patient received hydromorphone 1 mg IVP x 2 and ondansetron 4 mg IVP.  He was also started on LR 100 mL/h.  Review of Systems: As mentioned in the history of present illness. All other systems reviewed and are negative. 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 He has a 60 pack-year smoking history. He has never used smokeless tobacco. He reports that he does not currently use alcohol. He reports that he does not currently use drugs.  Allergies  Allergen Reactions   Amoxil [Amoxicillin] Anaphylaxis   Penicillins Anaphylaxis   Motrin [Ibuprofen] Other (See Comments)    Stomach pain after several uses of 800mg  tablets.    Family History  Problem Relation  Age of Onset   CVA Father     Prior to Admission medications   Medication Sig Start Date End Date Taking? Authorizing Provider  acetaminophen (TYLENOL) 500 MG tablet Take 2 tablets (1,000 mg total) by mouth every 6 (six) hours as needed for mild pain, moderate pain, fever or headache. 04/23/23   Barnetta Chapel, PA-C  ALPRAZolam Prudy Feeler) 0.25 MG tablet Take 1 tablet by mouth 2  (two) times daily as needed for anxiety.    [provider]  DULoxetine (CYMBALTA) 20 MG capsule Take 20 mg by mouth daily. For back pain 01/13/21   [provider]  fenofibrate (TRICOR) 145 MG tablet Take 1 tablet by mouth daily. Patient not taking: Reported on 05/20/2023    [provider]  losartan (COZAAR) 25 MG tablet Take 25 mg by mouth daily. 09/16/20   [provider]  metFORMIN (GLUCOPHAGE) 500 MG tablet Take 1 tablet (500 mg total) by mouth daily with breakfast. Patient taking differently: Take 250 mg by mouth 2 (two) times daily. 01/24/21 06/19/23  Maness, Loistine Chance, MD  methocarbamol (ROBAXIN) 500 MG tablet Take 1 tablet (500 mg total) by mouth every 8 (eight) hours as needed for muscle spasms. 04/23/23   Barnetta Chapel, PA-C  Multiple Vitamin (MULTIVITAMIN) tablet Take 1 tablet by mouth daily.    [provider]  ondansetron (ZOFRAN) 4 MG tablet Take 1 tablet (4 mg total) by mouth every 8 (eight) hours as needed for nausea or vomiting. 04/23/23   Leroy Sea, MD  oxyCODONE (OXY IR/ROXICODONE) 5 MG immediate release tablet Take 1 tablet (5 mg total) by mouth every 6 (six) hours as needed for moderate pain (pain score 4-6). 05/23/23   Ghimire, Werner Lean, MD  polyethylene glycol powder (GLYCOLAX/MIRALAX) 17 GM/SCOOP powder Take 17 g by mouth daily. 04/23/23   Leroy Sea, MD  senna-docusate (SENOKOT-S) 8.6-50 MG tablet Take 2 tablets by mouth at bedtime as needed for mild constipation. 04/23/23   Leroy Sea, MD  tiZANidine (ZANAFLEX) 4 MG tablet Take 4 mg by mouth every 8 (eight) hours as needed for muscle spasms. 05/10/23   [provider]    Physical Exam: Vitals:   06/01/23 0130 06/01/23 0154 06/01/23 0215 06/01/23 0343  BP: 115/71  108/64 132/71  Pulse: 83  80 85  Resp: 15  18 18   Temp:  99.9 F (37.7 C)  98 F (36.7 C)  TempSrc:  Oral  Oral  SpO2: 95%  95% 98%  Weight:    63.7 kg  Height:    6' (1.829 m)    Physical Exam Vitals and nursing note reviewed.  Constitutional:      General: He is awake. He is not in acute distress.    Appearance: He is well-developed and normal weight. He is ill-appearing.  HENT:     Head: Normocephalic.     Nose: No rhinorrhea.     Mouth/Throat:     Mouth: Mucous membranes are moist.  Eyes:     General: No scleral icterus.    Pupils: Pupils are equal, round, and reactive to light.  Neck:     Vascular: No JVD.  Cardiovascular:     Rate and Rhythm: Normal rate and regular rhythm.     Heart sounds: S1 normal and S2 normal.  Pulmonary:     Breath sounds: No decreased breath sounds, wheezing or rhonchi.  Abdominal:     General: A surgical scar is present. Bowel sounds are normal. There is  no distension.     Palpations: Abdomen is soft.     Tenderness: There is abdominal tenderness in the left upper quadrant. There is left CVA tenderness. There is no right CVA tenderness.  Musculoskeletal:     Cervical back: Neck supple.     Right lower leg: No edema.     Left lower leg: No edema.  Skin:    General: Skin is warm and dry.  Neurological:     General: No focal deficit present.     Mental Status: He is alert and oriented to person, place, and time.  Psychiatric:        Mood and Affect: Mood normal.        Behavior: Behavior normal. Behavior is cooperative.     Data Reviewed:  Results are pending, will review when available. 01/24/2021 transthoracic echocardiogram IMPRESSIONS:   1. Left ventricular ejection fraction, by estimation, is 60 to 65%. The  left ventricle has normal function. The left ventricle has no regional  wall motion abnormalities. Left ventricular diastolic parameters are  consistent with Grade I diastolic  dysfunction (impaired relaxation).   2. Right ventricular systolic function is normal. The right ventricular  size is normal. Tricuspid regurgitation signal is inadequate for assessing  PA pressure.   3. The mitral valve is  normal in structure. No evidence of mitral valve  regurgitation. No evidence of mitral stenosis.   4. The aortic valve is tricuspid. Aortic valve regurgitation is not  visualized. No aortic stenosis is present.   5. The inferior vena cava is normal in size with greater than 50%  respiratory variability, suggesting right atrial pressure of 3 mmHg.   EKG: Vent. rate 87 BPM PR interval 161 ms QRS duration 140 ms QT/QTcB 391/471 ms P-R-T axes 69 51 56 Sinus rhythm Probable left atrial enlargement Right bundle branch block  Assessment and Plan: Principal Problem:   Acute pancreatitis Superimposed on:   Chronic pancreatitis (HCC) Observation/MedSurg. Continue IV fluids. Clear liquid diet. Analgesics as needed. Antiemetics as needed. Pantoprazole 40 mg IVP daily. Follow CBC, CMP and lipase in AM.  Active Problems:   Intra-abdominal fluid collection No need to drain. Discussed with IR. -Dr. Elby Showers recommended to treat pancreatitis first. General surgery input appreciated. Continue analgesics as needed.    Possible pancreatic cyst/pancreatic pseudocyst Consider GI evaluation if symptoms persist.    SIRS (systemic inflammatory response syndrome) (HCC) Fluid collection from previous hospitalization was sterile. Will continue to watch and start IV antibiotics if needed.    Chronic alcohol abuse In remission since surgery.    Hyperlipidemia Lifestyle modifications. Follow-up with primary care provider.    Malnutrition of moderate degree In the setting of anemia/recent surgery May benefit from protein supplementation. -Will await for pancreatitis to get better. Consider nutritional services evaluation. Follow-up albumin level.    Essential hypertension Continue losartan 25 mg p.o. daily.    Type 2 diabetes mellitus (HCC) Currently on clear liquid diet. Continue metformin 250 mg p.o. twice daily.    Hyponatremia Due to recent poor nutrition/high free water  intake. Follow-up sodium level. Fluid restrict as needed.    Normocytic anemia Monitor hematocrit and hemoglobin.    Thrombocytosis In the setting of anemia and recent splenectomy. Follow-up platelet count in the morning.    Advance Care Planning:   Code Status: Full Code   Consults: [facility].  Family Communication:   Severity of Illness: The appropriate patient status for this patient is INPATIENT. Inpatient status is judged to  be reasonable and necessary in order to provide the required intensity of service to ensure the patient's safety. The patient's presenting symptoms, physical exam findings, and initial radiographic and laboratory data in the context of their chronic comorbidities is felt to place them at high risk for further clinical deterioration. Furthermore, it is not anticipated that the patient will be medically stable for discharge from the hospital within 2 midnights of admission.   * I certify that at the point of admission it is my clinical judgment that the patient will require inpatient hospital care spanning beyond 2 midnights from the point of admission due to high intensity of service, high risk for further deterioration and high frequency of surveillance required.*  Author: Bobette Mo, MD 06/01/2023 7:51 AM  For on call review [email or web address].   This document was prepared using Dragon voice recognition software and may contain some unintended transcription errors.

## 2023-06-01 NOTE — Progress Notes (Signed)
Pt arrived via stretcher via [facility]. Pt is Aox4, c/o pain L chest to L flank. Surgical scar noted from recent surgery. Pt states he is SOB d/t pain. Pt provided with IS per order and instructed on proper usage and purpose. Discussed POC. IVF started. Pt states he is concerned re: repeat complications from splenic surgery. Pt concerned about 40+ weight loss and would like to inquire on cancer evaluation. Instructed pt to speak with MD. Pt expresses frustration with repeat bleeding and hospitalizations. Emotional support provided.

## 2023-06-01 NOTE — ED Notes (Signed)
[facility] here for patient transport. 

## 2023-06-01 NOTE — Consult Note (Signed)
[facility] Surgery Consult Note  Samuel Abbott [birth year]  [id number].    Requesting MD: Sanda Klein Chief Complaint/Reason for Consult: pancreatitis  HPI:  Samuel Abbott is a 62 y.o. male PMH DM, HTN, HLD, tobacco abuse, ETOH abuse and h/o open splenectomy 6 weeks ago by Dr. 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 Patient was recently admitted 05/19/23 through 05/23/23 where he underwent drain placement 11/7 for postoperative abdominal fluid collection that proved to be hematoma. Drain removed prior to discharge since culture was negative for infection and drain had minimal output. He returned to the ED last night due to worsening abdominal pain. This pain is mostly in his left side and left back. Denies epigastric pain. He does state that his abdominal pain was stable to improved after discharge 11/10, but then worsened again recently. Associated with nausea and reported fever up to 101. No emesis. He denies EtOh use at home.  ED work up included CT scan which showed Persistent large left upper quadrant fluid collection 13.5x 9.8 cm; stranding/inflammation around the pancreatic body and tail concerning for acute pancreatitis; secondary wall thickening/inflammation along the greater curvature of the stomach with 4.2 x 1.5 cm fluid collection posterior to the gastric wall. WBC 18. TMAX 99.9 with VSS. Patient is being admitted to the medical service. General surgery asked to see.   Family History  Problem Relation Age of Onset   CVA Father     Past Medical History:  Diagnosis Date   Acute pancreatitis    Alcohol abuse    Diabetes mellitus without complication (HCC)    Eczema    High cholesterol    Hypertension    Tobacco abuse     Past Surgical History:  Procedure Laterality Date   SPLENECTOMY, TOTAL N/A 04/21/2023   Procedure: OPEN SPLENECTOMY;  Surgeon: [name], MD;  Location:  [facility] OR;  Service: General;  Laterality: N/A;   WRIST SURGERY      Social History:  reports that he has been smoking cigarettes. He has a 60 pack-year smoking history. He has never used smokeless tobacco. He reports that he does not currently use alcohol. He reports that he does not currently use drugs.  Allergies:  Allergies  Allergen Reactions   Amoxil [Amoxicillin] Anaphylaxis   Penicillins Anaphylaxis   Motrin [Ibuprofen] Other (See Comments)    Stomach pain after several uses of 800mg  tablets.    Medications Prior to Admission  Medication Sig Dispense Refill   acetaminophen (TYLENOL) 500 MG tablet Take 2 tablets (1,000 mg total) by mouth every 6 (six) hours as needed for mild pain, moderate pain, fever or headache.     ALPRAZolam (XANAX) 0.25 MG tablet Take 1 tablet by mouth 2 (two) times daily as needed for anxiety.     Chlorphen-Pseudoephed-APAP (THERAFLU FLU/COLD PO) Take 1 Dose by mouth 2 (two) times daily as needed (Cold sx).     Cholecalciferol (D3 PO) Take 1 tablet by mouth daily.     Cyanocobalamin (B-12 PO) Take 1 tablet by mouth daily.     DULoxetine (CYMBALTA) 20 MG capsule Take 20 mg by mouth daily. For back pain     Ferrous Sulfate (IRON PO) Take 1 tablet by mouth daily.     losartan (COZAAR) 25 MG tablet Take 25 mg by mouth daily.     metFORMIN (GLUCOPHAGE) 500 MG tablet Take 1 tablet (500 mg total) by mouth daily with  breakfast. (Patient taking differently: Take 250 mg by mouth 2 (two) times daily.) 30 tablet 0   methocarbamol (ROBAXIN) 500 MG tablet Take 1 tablet (500 mg total) by mouth every 8 (eight) hours as needed for muscle spasms. 60 tablet 1   Multiple Vitamin (MULTIVITAMIN) tablet Take 1 tablet by mouth daily.     ondansetron (ZOFRAN) 4 MG tablet Take 1 tablet (4 mg total) by mouth every 8 (eight) hours as needed for nausea or vomiting. 12 tablet 0   oxyCODONE (OXY IR/ROXICODONE) 5 MG immediate release tablet Take 1 tablet (5 mg total) by mouth every 6 (six)  hours as needed for moderate pain (pain score 4-6). 25 tablet 0   senna-docusate (SENOKOT-S) 8.6-50 MG tablet Take 2 tablets by mouth at bedtime as needed for mild constipation. 15 tablet 0   tiZANidine (ZANAFLEX) 4 MG tablet Take 4 mg by mouth every 8 (eight) hours as needed for muscle spasms.      Prior to Admission medications   Medication Sig Start Date End Date Taking? Authorizing Provider  acetaminophen (TYLENOL) 500 MG tablet Take 2 tablets (1,000 mg total) by mouth every 6 (six) hours as needed for mild pain, moderate pain, fever or headache. 04/23/23  Yes Barnetta Chapel, PA-C  ALPRAZolam Prudy Feeler) 0.25 MG tablet Take 1 tablet by mouth 2 (two) times daily as needed for anxiety.   Yes [provider]  Chlorphen-Pseudoephed-APAP (THERAFLU FLU/COLD PO) Take 1 Dose by mouth 2 (two) times daily as needed (Cold sx).   Yes [provider]  Cholecalciferol (D3 PO) Take 1 tablet by mouth daily.   Yes [provider]  Cyanocobalamin (B-12 PO) Take 1 tablet by mouth daily.   Yes [provider]  DULoxetine (CYMBALTA) 20 MG capsule Take 20 mg by mouth daily. For back pain 01/13/21  Yes [provider]  Ferrous Sulfate (IRON PO) Take 1 tablet by mouth daily.   Yes [provider]  losartan (COZAAR) 25 MG tablet Take 25 mg by mouth daily. 09/16/20  Yes [provider]  metFORMIN (GLUCOPHAGE) 500 MG tablet Take 1 tablet (500 mg total) by mouth daily with breakfast. Patient taking differently: Take 250 mg by mouth 2 (two) times daily. 01/24/21 06/19/23 Yes Maness, Loistine Chance, MD  methocarbamol (ROBAXIN) 500 MG tablet Take 1 tablet (500 mg total) by mouth every 8 (eight) hours as needed for muscle spasms. 04/23/23  Yes Barnetta Chapel, PA-C  Multiple Vitamin (MULTIVITAMIN) tablet Take 1 tablet by mouth daily.   Yes [provider]  ondansetron (ZOFRAN) 4 MG tablet Take 1 tablet (4 mg total) by mouth every 8 (eight) hours as needed for nausea or  vomiting. 04/23/23  Yes Leroy Sea, MD  oxyCODONE (OXY IR/ROXICODONE) 5 MG immediate release tablet Take 1 tablet (5 mg total) by mouth every 6 (six) hours as needed for moderate pain (pain score 4-6). 05/23/23  Yes Ghimire, Werner Lean, MD  senna-docusate (SENOKOT-S) 8.6-50 MG tablet Take 2 tablets by mouth at bedtime as needed for mild constipation. 04/23/23  Yes Leroy Sea, MD  tiZANidine (ZANAFLEX) 4 MG tablet Take 4 mg by mouth every 8 (eight) hours as needed for muscle spasms. 05/10/23  Yes [provider]    Blood pressure 123/65, pulse 86, temperature 99.1 F (37.3 C), temperature source Oral, resp. rate 16, height 6' (1.829 m), weight 63.7 kg, SpO2 100%. Physical Exam: General: WD/WN male who is laying in bed in NAD HEENT: head is normocephalic, atraumatic.  Sclera  are noninjected.  Pupils equal and round.  Ears and nose without any masses or lesions.  Mouth is pink and moist. Dentition fair Heart: regular, rate, and rhythm.  Lungs:  Respiratory effort nonlabored on room air Abd: incision well healed, soft, tender in LUQ, L flank, L mid back, no epigastric pain.  MS: no BUE/BLE edema Skin: warm and dry with no masses, lesions, or rashes Psych: A&Ox4 with an appropriate affect Neuro: MAEs, no gross motor or sensory deficits BUE/BLE  Results for orders placed or performed during the hospital encounter of 05/31/23 (from the past 48 hour(s))  Urinalysis, w/ Reflex to Culture (Infection Suspected) -Urine, Clean Catch     Status: Abnormal   Collection Time: 05/31/23 12:00 AM  Result Value Ref Range   Specimen Source URINE, CLEAN CATCH    Color, Urine YELLOW YELLOW   APPearance CLEAR CLEAR   Specific Gravity, Urine >1.046 (H) 1.005 - 1.030   pH 7.0 5.0 - 8.0   Glucose, UA NEGATIVE NEGATIVE mg/dL   Hgb urine dipstick NEGATIVE NEGATIVE   Bilirubin Urine NEGATIVE NEGATIVE   Ketones, ur NEGATIVE NEGATIVE mg/dL   Protein, ur 30 (A) NEGATIVE mg/dL   Nitrite NEGATIVE  NEGATIVE   Leukocytes,Ua NEGATIVE NEGATIVE   RBC / HPF 0-5 0 - 5 RBC/hpf   WBC, UA 0-5 0 - 5 WBC/hpf    Comment:        Reflex urine culture not performed if WBC <=10, OR if Squamous epithelial cells >5. If Squamous epithelial cells >5 suggest recollection.    Bacteria, UA NONE SEEN NONE SEEN   Squamous Epithelial / HPF 0-5 0 - 5 /HPF   Mucus PRESENT    Hyaline Casts, UA PRESENT     Comment: Performed at [facility], [address] [place], [place] [address]  Lactic acid, plasma     Status: None   Collection Time: 05/31/23  6:28 PM  Result Value Ref Range   Lactic Acid, Venous 1.1 0.5 - 1.9 mmol/L    Comment: Performed at [facility], [address] [place], [place] [address]  Comprehensive metabolic panel     Status: Abnormal   Collection Time: 05/31/23  6:28 PM  Result Value Ref Range   Sodium 132 (L) 135 - 145 mmol/L   Potassium 3.9 3.5 - 5.1 mmol/L   Chloride 97 (L) 98 - 111 mmol/L   CO2 23 22 - 32 mmol/L   Glucose, Bld 116 (H) 70 - 99 mg/dL    Comment: Glucose reference range applies only to samples taken after fasting for at least 8 hours.   BUN 10 6 - 20 mg/dL   Creatinine, Ser 2.95 (L) 0.61 - 1.24 mg/dL   Calcium 9.3 8.9 - 62.1 mg/dL   Total Protein 6.7 6.5 - 8.1 g/dL   Albumin 3.4 (L) 3.5 - 5.0 g/dL   AST 24 15 - 41 U/L   ALT 23 0 - 44 U/L   Alkaline Phosphatase 95 38 - 126 U/L   Total Bilirubin 0.5 <1.2 mg/dL   GFR, Estimated >30 >86 mL/min    Comment: (NOTE) Calculated using the CKD-EPI Creatinine Equation (2021)    Anion gap 12 5 - 15    Comment: Performed at [facility], [address] [place], [place] [address]  CBC with Differential     Status: Abnormal   Collection Time: 05/31/23  6:28 PM  Result Value Ref Range   WBC 18.0 (H) 4.0 - 10.5 K/uL  RBC 3.51 (L) 4.22 - 5.81 MIL/uL   Hemoglobin 9.5 (L) 13.0 - 17.0 g/dL   HCT 40.9 (L) 81.1 - 91.4 %   MCV 84.0 80.0 - 100.0 fL   MCH 27.1 26.0  - 34.0 pg   MCHC 32.2 30.0 - 36.0 g/dL   RDW 78.2 95.6 - 21.3 %   Platelets 868 (H) 150 - 400 K/uL   nRBC 0.0 0.0 - 0.2 %   Neutrophils Relative % 75 %   Neutro Abs 13.4 (H) 1.7 - 7.7 K/uL   Lymphocytes Relative 15 %   Lymphs Abs 2.7 0.7 - 4.0 K/uL   Monocytes Relative 9 %   Monocytes Absolute 1.6 (H) 0.1 - 1.0 K/uL   Eosinophils Relative 1 %   Eosinophils Absolute 0.1 0.0 - 0.5 K/uL   Basophils Relative 0 %   Basophils Absolute 0.1 0.0 - 0.1 K/uL   Immature Granulocytes 0 %   Abs Immature Granulocytes 0.08 (H) 0.00 - 0.07 K/uL    Comment: Performed at [facility], [address], [place], [place] [address]  Troponin I (High Sensitivity)     Status: None   Collection Time: 05/31/23  6:39 PM  Result Value Ref Range   Troponin I (High Sensitivity) 3 <18 ng/L    Comment: (NOTE) Elevated high sensitivity troponin I (hsTnI) values and significant  changes across serial measurements may suggest ACS but many other  chronic and acute conditions are known to elevate hsTnI results.  Refer to the "Links" section for chest pain algorithms and additional  guidance. Performed at [facility], [address], [place], [place] [address]   Lipase, blood     Status: Abnormal   Collection Time: 05/31/23  6:39 PM  Result Value Ref Range   Lipase 182 (H) 11 - 51 U/L    Comment: Performed at [facility], [address] [place], [place] [address]  Troponin I (High Sensitivity)     Status: None   Collection Time: 05/31/23  8:47 PM  Result Value Ref Range   Troponin I (High Sensitivity) 3 <18 ng/L    Comment: (NOTE) Elevated high sensitivity troponin I (hsTnI) values and significant  changes across serial measurements may suggest ACS but many other  chronic and acute conditions are known to elevate hsTnI results.  Refer to the "Links" section for chest pain algorithms and additional  guidance. Performed at [facility], [address], [place], [place] [address]   POC CBG, ED     Status: Abnormal   Collection Time: 06/01/23  1:25 AM  Result Value Ref Range   Glucose-Capillary 140 (H) 70 - 99 mg/dL    Comment: Glucose reference range applies only to samples taken after fasting for at least 8 hours.  Glucose, capillary     Status: Abnormal   Collection Time: 06/01/23  6:39 AM  Result Value Ref Range   Glucose-Capillary 106 (H) 70 - 99 mg/dL    Comment: Glucose reference range applies only to samples taken after fasting for at least 8 hours.   CT ABDOMEN PELVIS W CONTRAST  Result Date: 05/31/2023 CLINICAL DATA:  Abdominal pain, chest pain, fever EXAM: CT ABDOMEN AND PELVIS WITH CONTRAST TECHNIQUE: Multidetector CT imaging of the abdomen and pelvis was performed using the standard protocol following bolus administration of intravenous contrast. RADIATION DOSE REDUCTION: This exam was performed according to the departmental dose-optimization program which includes automated exposure control, adjustment of the mA and/or kV according to  patient size and/or use of iterative reconstruction technique. CONTRAST:  80mL OMNIPAQUE IOHEXOL 300 MG/ML  SOLN COMPARISON:  05/20/2023 FINDINGS: Lower chest: Trace left pleural effusion. Left base atelectasis. Findings stable since prior study. Hepatobiliary: No focal hepatic abnormality. Gallbladder unremarkable. Pancreas: Calcifications in the pancreatic head. Stranding around the pancreas, new since prior study, best seen around the body and tail suspicious for acute pancreatitis. Fluid also noted tracking along the greater curvature of the stomach with small focal fluid collection posterior to the stomach measuring 4.2 x 1.5 cm. Spleen: Reported prior splenectomy. There appears to be some residual splenic tissue. Large fluid collection in the left upper quadrant measuring 13.5 x 9.8 cm. Previously seen pigtail drainage catheter within this fluid collection no longer  visualized. Adrenals/Urinary Tract: No adrenal abnormality. No focal renal abnormality. No stones or hydronephrosis. Urinary bladder is unremarkable. Stomach/Bowel: Mild wall thickening of along the greater curvature of the stomach, likely secondary inflammation related to inflamed pancreas. Normal appendix. Vascular/Lymphatic: No evidence of aneurysm or adenopathy. Reproductive: No visible focal abnormality. Other: No free fluid or free air. Musculoskeletal: No acute bony abnormality. IMPRESSION: Persistent large left upper quadrant fluid collection measuring 13.5 x 9.8 cm. Previously placed pigtail drainage catheter no longer visualized within the fluid collection. Stranding/inflammation around the pancreatic body and tail concerning for acute pancreatitis. Secondary wall thickening/inflammation along the greater curvature of the stomach with 4.2 x 1.5 cm fluid collection posterior to the gastric wall. Trace left pleural effusion, left base atelectasis. Electronically Signed   By: Charlett Nose M.D.   On: 05/31/2023 23:38   DG Chest Port 1 View  Result Date: 05/31/2023 CLINICAL DATA:  Central chest pain cough, fever. EXAM: PORTABLE CHEST 1 VIEW COMPARISON:  05/20/2023. FINDINGS: The heart size and mediastinal contours are within normal limits. Mild atelectasis or scarring is noted at the left lung base. No effusion or pneumothorax is seen. No acute osseous abnormality. IMPRESSION: Mild atelectasis or scarring at the left lung base. Electronically Signed   By: Thornell Sartorius M.D.   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 Luisa Hart -Postop fluid collection, likely hematoma - CT scan shows a persistent large left upper quadrant fluid collection 13.5x 9.8 cm;  stranding/inflammation around the pancreatic body and tail concerning for acute pancreatitis; secondary wall thickening/inflammation along the greater curvature of the stomach with 4.2 x 1.5 cm fluid collection posterior to the gastric wall - LUQ fluid collection similar to prior. It was sampled by IR and culture was negative. No gas or rim enhancement to indicate this is now infected. WBC is elevated but he is also post-splenectomy. Given this is hematoma it did not drain well with the last drain placement, therefore would not recommend draining at this point. Monitor for fever or any signs of infection. If he does show signs of infection then would consider antibiotics and IR consult for drainage - Pancreatic inflammation is new compared to last CT scan, and lipase is more elevated at 182. Management of pancreatitis per primary team with supportive care. Unsure if the smaller fluid collection is a developing pseudocyst. Consider GI consult or follow up. - No indication for surgical intervention. We will follow.  ID - none indicated  VTE - SCDs, ok for chemical dvt ppx from surgical standpoint  FEN - NPO, ok for diet from surgical standpoint Foley - none  DM HTN HLD Chronic pain - may  need outpatient pain doctor Tobacco abuse ETOH abuse  I reviewed nursing notes, hospitalist notes, last 24 h vitals and pain scores, last 48 h intake and output, last 24 h labs and trends, and last 24 h imaging results.   Hosie Spangle, [facility] Surgery 06/01/2023, 11:21 AM Please see Amion for pager number during day hours 7:00am-4:30pm

## 2023-06-01 NOTE — Progress Notes (Signed)
(  Carryover admission to the Day Admitter; accepted by Dr.  Margo Aye as transfer from  [facility]  to a  med-tele bed at  [facility]  for acute pancreatitis. Please see Dr.  Scharlene Gloss transfer documentation below as well as my progress note for additional details).   I have placed some additional preliminary admit orders via the adult multi-morbid admission order set. I have also ordered prn IV Dilaudid, prn IV Zofran, NPO except for sips with meds, continuous lactated Ringer's for 12 hours, incentive spirometry.  Also ordered morning labs in the form of CMP, CBC magnesium level.    Newton Pigg, DO Hospitalist

## 2023-06-01 NOTE — Progress Notes (Signed)
   06/01/23 1122  TOC Brief Assessment  Insurance and Status Reviewed  Patient has primary care physician Yes  Home environment has been reviewed Lives alone in an apartment  Prior level of function: Independent at baseline  Prior/Current Home Services No current home services  Social Determinants of Health Reivew SDOH reviewed no interventions necessary  Readmission risk has been reviewed Yes  Transition of care needs no transition of care needs at this time

## 2023-06-01 NOTE — Plan of Care (Signed)
  Problem: Education: Goal: Knowledge of General Education information will improve Description: Including pain rating scale, medication(s)/side effects and non-pharmacologic comfort measures Outcome: Progressing   Problem: Health Behavior/Discharge Planning: Goal: Ability to manage health-related needs will improve Outcome: Progressing   Problem: Clinical Measurements: Goal: Diagnostic test results will improve Outcome: Progressing   

## 2023-06-01 NOTE — Plan of Care (Signed)
  Problem: Education: Goal: Knowledge of General Education information will improve Description: Including pain rating scale, medication(s)/side effects and non-pharmacologic comfort measures Outcome: Progressing   Problem: Nutrition: Goal: Adequate nutrition will be maintained Outcome: Progressing   Problem: Activity: Goal: Risk for activity intolerance will decrease Outcome: Progressing   Problem: Coping: Goal: Level of anxiety will decrease Outcome: Progressing   Problem: Elimination: Goal: Will not experience complications related to bowel motility Outcome: Progressing Goal: Will not experience complications related to urinary retention Outcome: Progressing   Problem: Pain Management: Goal: General experience of comfort will improve Outcome: Progressing   Problem: Coping: Goal: Ability to adjust to condition or change in health will improve Outcome: Progressing   Problem: Nutritional: Goal: Maintenance of adequate nutrition will improve Outcome: Progressing

## 2023-06-02 DIAGNOSIS — F1010 Alcohol abuse, uncomplicated: Secondary | ICD-10-CM

## 2023-06-02 DIAGNOSIS — E871 Hypo-osmolality and hyponatremia: Secondary | ICD-10-CM

## 2023-06-02 DIAGNOSIS — K863 Pseudocyst of pancreas: Secondary | ICD-10-CM

## 2023-06-02 DIAGNOSIS — S301XXA Contusion of abdominal wall, initial encounter: Secondary | ICD-10-CM | POA: Diagnosis not present

## 2023-06-02 DIAGNOSIS — I10 Essential (primary) hypertension: Secondary | ICD-10-CM | POA: Diagnosis not present

## 2023-06-02 DIAGNOSIS — K859 Acute pancreatitis without necrosis or infection, unspecified: Secondary | ICD-10-CM | POA: Diagnosis not present

## 2023-06-02 DIAGNOSIS — E440 Moderate protein-calorie malnutrition: Secondary | ICD-10-CM

## 2023-06-02 DIAGNOSIS — E785 Hyperlipidemia, unspecified: Secondary | ICD-10-CM

## 2023-06-02 DIAGNOSIS — E119 Type 2 diabetes mellitus without complications: Secondary | ICD-10-CM

## 2023-06-02 DIAGNOSIS — D75839 Thrombocytosis, unspecified: Secondary | ICD-10-CM

## 2023-06-02 DIAGNOSIS — K860 Alcohol-induced chronic pancreatitis: Secondary | ICD-10-CM

## 2023-06-02 DIAGNOSIS — Z720 Tobacco use: Secondary | ICD-10-CM

## 2023-06-02 DIAGNOSIS — D649 Anemia, unspecified: Secondary | ICD-10-CM

## 2023-06-02 DIAGNOSIS — Z9081 Acquired absence of spleen: Secondary | ICD-10-CM

## 2023-06-02 LAB — URINALYSIS, ROUTINE W REFLEX MICROSCOPIC
Bacteria, UA: NONE SEEN
Bilirubin Urine: NEGATIVE
Glucose, UA: NEGATIVE mg/dL
Ketones, ur: NEGATIVE mg/dL
Leukocytes,Ua: NEGATIVE
Nitrite: NEGATIVE
Protein, ur: NEGATIVE mg/dL
Specific Gravity, Urine: 1.013 (ref 1.005–1.030)
pH: 5 (ref 5.0–8.0)

## 2023-06-02 LAB — CBC
HCT: 23.6 % — ABNORMAL LOW (ref 39.0–52.0)
Hemoglobin: 7.6 g/dL — ABNORMAL LOW (ref 13.0–17.0)
MCH: 28 pg (ref 26.0–34.0)
MCHC: 32.2 g/dL (ref 30.0–36.0)
MCV: 87.1 fL (ref 80.0–100.0)
Platelets: 759 10*3/uL — ABNORMAL HIGH (ref 150–400)
RBC: 2.71 MIL/uL — ABNORMAL LOW (ref 4.22–5.81)
RDW: 15.6 % — ABNORMAL HIGH (ref 11.5–15.5)
WBC: 16.4 10*3/uL — ABNORMAL HIGH (ref 4.0–10.5)
nRBC: 0 % (ref 0.0–0.2)

## 2023-06-02 LAB — COMPREHENSIVE METABOLIC PANEL
ALT: 15 U/L (ref 0–44)
AST: 18 U/L (ref 15–41)
Albumin: 2.4 g/dL — ABNORMAL LOW (ref 3.5–5.0)
Alkaline Phosphatase: 95 U/L (ref 38–126)
Anion gap: 7 (ref 5–15)
BUN: 9 mg/dL (ref 6–20)
CO2: 27 mmol/L (ref 22–32)
Calcium: 8.2 mg/dL — ABNORMAL LOW (ref 8.9–10.3)
Chloride: 91 mmol/L — ABNORMAL LOW (ref 98–111)
Creatinine, Ser: 0.44 mg/dL — ABNORMAL LOW (ref 0.61–1.24)
GFR, Estimated: >60 mL/min (ref >60)
Glucose, Bld: 77 mg/dL (ref 70–99)
Potassium: 4.1 mmol/L (ref 3.5–5.1)
Sodium: 125 mmol/L — ABNORMAL LOW (ref 135–145)
Total Bilirubin: 0.6 mg/dL (ref <1.2)
Total Protein: 5.7 g/dL — ABNORMAL LOW (ref 6.5–8.1)

## 2023-06-02 LAB — CREATININE, URINE, RANDOM: Creatinine, Urine: 119 mg/dL

## 2023-06-02 LAB — GLUCOSE, CAPILLARY
Glucose-Capillary: 103 mg/dL — ABNORMAL HIGH (ref 70–99)
Glucose-Capillary: 132 mg/dL — ABNORMAL HIGH (ref 70–99)
Glucose-Capillary: 138 mg/dL — ABNORMAL HIGH (ref 70–99)
Glucose-Capillary: 82 mg/dL (ref 70–99)

## 2023-06-02 LAB — TSH: TSH: 1.302 u[IU]/mL (ref 0.350–4.500)

## 2023-06-02 LAB — OSMOLALITY: Osmolality: 267 mosm/kg — ABNORMAL LOW (ref 275–295)

## 2023-06-02 LAB — SODIUM, URINE, RANDOM: Sodium, Ur: 40 mmol/L

## 2023-06-02 LAB — OSMOLALITY, URINE: Osmolality, Ur: 476 mosm/kg (ref 300–900)

## 2023-06-02 LAB — LIPASE, BLOOD: Lipase: 60 U/L — ABNORMAL HIGH (ref 11–51)

## 2023-06-02 MED ORDER — OXYCODONE HCL 5 MG PO TABS: 5.0000 mg | ORAL_TABLET | Freq: Four times a day (QID) | ORAL | Status: DC | PRN

## 2023-06-02 MED ORDER — THIAMINE HCL 100 MG/ML IJ SOLN: 100.0000 mg | Freq: Every day | INTRAMUSCULAR | Status: DC

## 2023-06-02 MED ORDER — LORAZEPAM 2 MG/ML IJ SOLN: 1.0000 mg | INTRAMUSCULAR | Status: DC | PRN

## 2023-06-02 MED ORDER — LORAZEPAM 1 MG PO TABS: 1.0000 mg | ORAL_TABLET | ORAL | Status: DC | PRN

## 2023-06-02 MED ORDER — ALPRAZOLAM 0.25 MG PO TABS: 0.2500 mg | ORAL_TABLET | Freq: Two times a day (BID) | ORAL | Status: DC | PRN

## 2023-06-02 MED ORDER — ACETAMINOPHEN 500 MG PO TABS
1000.0000 mg | ORAL_TABLET | Freq: Four times a day (QID) | ORAL | Status: DC
  Administered 2023-06-02 – 2023-06-03 (×3): 1000 mg via ORAL
  Filled 2023-06-02 (×3): qty 2

## 2023-06-02 MED ORDER — HYDROMORPHONE HCL 2 MG/ML IJ SOLN
1.5000 mg | INTRAMUSCULAR | Status: DC | PRN
  Administered 2023-06-02 – 2023-06-03 (×9): 1.5 mg via INTRAVENOUS
  Filled 2023-06-02 (×9): qty 1

## 2023-06-02 MED ORDER — KETOROLAC TROMETHAMINE 15 MG/ML IJ SOLN: 15.0000 mg | Freq: Four times a day (QID) | INTRAMUSCULAR | Status: DC | PRN

## 2023-06-02 MED ORDER — GABAPENTIN 300 MG PO CAPS
300.0000 mg | ORAL_CAPSULE | Freq: Three times a day (TID) | ORAL | Status: DC
  Administered 2023-06-02: 300 mg via ORAL
  Filled 2023-06-02 (×3): qty 1

## 2023-06-02 MED ORDER — ADULT MULTIVITAMIN W/MINERALS CH
1.0000 | ORAL_TABLET | Freq: Every day | ORAL | Status: DC
  Administered 2023-06-02 – 2023-06-03 (×2): 1 via ORAL
  Filled 2023-06-02 (×2): qty 1

## 2023-06-02 MED ORDER — FOLIC ACID 1 MG PO TABS
1.0000 mg | ORAL_TABLET | Freq: Every day | ORAL | Status: DC
  Administered 2023-06-02 – 2023-06-03 (×2): 1 mg via ORAL
  Filled 2023-06-02 (×2): qty 1

## 2023-06-02 MED ORDER — SENNOSIDES-DOCUSATE SODIUM 8.6-50 MG PO TABS
2.0000 | ORAL_TABLET | Freq: Two times a day (BID) | ORAL | Status: DC
  Administered 2023-06-02 – 2023-06-03 (×2): 2 via ORAL
  Administered 2023-06-03: 1 via ORAL
  Filled 2023-06-02 (×3): qty 2

## 2023-06-02 MED ORDER — LACTATED RINGERS IV SOLN: INTRAVENOUS | Status: DC

## 2023-06-02 MED ORDER — METHOCARBAMOL 500 MG PO TABS
750.0000 mg | ORAL_TABLET | Freq: Three times a day (TID) | ORAL | Status: DC
  Administered 2023-06-02 – 2023-06-03 (×4): 750 mg via ORAL
  Filled 2023-06-02 (×4): qty 2

## 2023-06-02 MED ORDER — THIAMINE MONONITRATE 100 MG PO TABS
100.0000 mg | ORAL_TABLET | Freq: Every day | ORAL | Status: DC
  Administered 2023-06-02 – 2023-06-03 (×2): 100 mg via ORAL
  Filled 2023-06-02 (×2): qty 1

## 2023-06-02 NOTE — Progress Notes (Signed)
Subjective/Chief Complaint: C/o ongoing pain in LUQ, back, flank, lower chest, shoulder and neck. Subxyphoid pain new this morning.    Objective: Vital signs in last 24 hours: Temp:  [98.3 F (36.8 C)-100.2 F (37.9 C)] 99 F (37.2 C) (11/20 0455) Pulse Rate:  [76-89] 83 (11/20 0455) Resp:  [16-18] 18 (11/20 0455) BP: (95-123)/(59-77) 123/59 (11/20 0455) SpO2:  [94 %-100 %] 94 % (11/20 0455) Weight:  [65.9 kg] 65.9 kg (11/20 0500) Last BM Date : 05/31/23  Intake/Output from previous day: 11/19 0701 - 11/20 0700 In: 2727.5 [P.O.:960; I.V.:1767.5] Out: 950 [Urine:950] Intake/Output this shift: No intake/output data recorded.  A&Ox3 Unlabored resp on room air Abd soft, nondistended. LUQ incision well healed. Tender LUQ/flank  Lab Results:  Recent Labs    06/01/23 1327 06/02/23 0602  WBC 18.0* 16.4*  HGB 8.5* 7.6*  HCT 26.8* 23.6*  PLT 813* 759*   BMET Recent Labs    06/01/23 1327 06/02/23 0602  NA 129* 125*  K 4.4 4.1  CL 95* 91*  CO2 24 27  GLUCOSE 104* 77  BUN 12 9  CREATININE 0.43* 0.44*  CALCIUM 8.6* 8.2*   PT/INR No results for input(s): "LABPROT", "INR" in the last 72 hours. ABG No results for input(s): "PHART", "HCO3" in the last 72 hours.  Invalid input(s): "PCO2", "PO2"  Studies/Results: CT ABDOMEN PELVIS W CONTRAST  Result Date: 05/31/2023 CLINICAL DATA:  Abdominal pain, chest pain, fever EXAM: CT ABDOMEN AND PELVIS WITH CONTRAST TECHNIQUE: Multidetector CT imaging of the abdomen and pelvis was performed using the standard protocol following bolus administration of intravenous contrast. RADIATION DOSE REDUCTION: This exam was performed according to the departmental dose-optimization program which includes automated exposure control, adjustment of the mA and/or kV according to patient size and/or use of iterative reconstruction technique. CONTRAST:  80mL OMNIPAQUE IOHEXOL 300 MG/ML  SOLN COMPARISON:  05/20/2023 FINDINGS: Lower chest: Trace  left pleural effusion. Left base atelectasis. Findings stable since prior study. Hepatobiliary: No focal hepatic abnormality. Gallbladder unremarkable. Pancreas: Calcifications in the pancreatic head. Stranding around the pancreas, new since prior study, best seen around the body and tail suspicious for acute pancreatitis. Fluid also noted tracking along the greater curvature of the stomach with small focal fluid collection posterior to the stomach measuring 4.2 x 1.5 cm. Spleen: Reported prior splenectomy. There appears to be some residual splenic tissue. Large fluid collection in the left upper quadrant measuring 13.5 x 9.8 cm. Previously seen pigtail drainage catheter within this fluid collection no longer visualized. Adrenals/Urinary Tract: No adrenal abnormality. No focal renal abnormality. No stones or hydronephrosis. Urinary bladder is unremarkable. Stomach/Bowel: Mild wall thickening of along the greater curvature of the stomach, likely secondary inflammation related to inflamed pancreas. Normal appendix. Vascular/Lymphatic: No evidence of aneurysm or adenopathy. Reproductive: No visible focal abnormality. Other: No free fluid or free air. Musculoskeletal: No acute bony abnormality. IMPRESSION: Persistent large left upper quadrant fluid collection measuring 13.5 x 9.8 cm. Previously placed pigtail drainage catheter no longer visualized within the fluid collection. Stranding/inflammation around the pancreatic body and tail concerning for acute pancreatitis. Secondary wall thickening/inflammation along the greater curvature of the stomach with 4.2 x 1.5 cm fluid collection posterior to the gastric wall. Trace left pleural effusion, left base atelectasis. Electronically Signed   By: Charlett Nose M.D.   On: 05/31/2023 23:38   DG Chest Port 1 View  Result Date: 05/31/2023 CLINICAL DATA:  Central chest pain cough, fever. EXAM: PORTABLE CHEST 1 VIEW COMPARISON:  05/20/2023.  FINDINGS: The heart size and  mediastinal contours are within normal limits. Mild atelectasis or scarring is noted at the left lung base. No effusion or pneumothorax is seen. No acute osseous abnormality. IMPRESSION: Mild atelectasis or scarring at the left lung base. Electronically Signed   By: Thornell Sartorius M.D.   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 Luisa Hart -Postop fluid collection, likely hematoma - CT scan shows a persistent large left upper quadrant fluid collection 13.5x 9.8 cm; stranding/inflammation around the pancreatic body and tail concerning for acute pancreatitis; secondary wall thickening/inflammation along the greater curvature of the stomach with 4.2 x 1.5 cm fluid collection posterior to the gastric wall - LUQ fluid collection similar to prior. It was sampled by IR and culture was negative. No gas or rim enhancement to indicate this is now infected. WBC is elevated but he is also post-splenectomy. Given this is hematoma it did not drain well with the last drain placement, therefore would not recommend draining at this point. Monitor for fever or any signs of infection. If he does show signs of infection then would consider antibiotics and IR consult for drainage. - Pancreatic inflammation is new compared to last CT scan, and lipase is more elevated at 182. Management of pancreatitis per primary team with supportive care. Unsure if the smaller fluid collection is a developing pseudocyst. Consider GI consult or follow up. -Will increase pain meds and add multimodal regimen (scheduled tylenol, gabapentin and robaxin; PRN oxycodone, toradol and dilaudid) - No indication for surgical intervention. We will follow.   ID - none indicated. Patient reports nightly fevers  since last hospitalization. Also notes dysuria. Will check urine culture and blood cultures.   VTE - SCDs, ok for chemical dvt ppx from surgical standpoint FEN - NPO, ok for diet from surgical standpoint Foley - none  DM HTN HLD Chronic pain - may  need outpatient pain doctor Tobacco abuse ETOH abuse   I reviewed nursing notes, hospitalist notes, last 24 h vitals and pain scores, last 48 h intake and output, last 24 h labs and trends, and last 24 h imaging results.   LOS: 1 day    Berna Bue 06/02/2023

## 2023-06-02 NOTE — Progress Notes (Signed)
PROGRESS NOTE    Samuel Abbott  ZHY:[id number] DOB: 1961-04-16 DOA: 05/31/2023 PCP: Norm Salt, PA    Chief Complaint  Patient presents with   Chest Pain    Brief Narrative:  Patient 62 year old gentleman history of type 2 diabetes, eczema, hyperlipidemia, hypertension, tobacco abuse, alcohol abuse presented to the ED with abdominal left upper quadrant, chest pain associated nausea vomiting and fever.  Patient noted to recently been hospitalized due to abdominal pain in the setting of splenic vein hematoma status post penectomy 04/21/2023.  Patient noted to be readmitted 05/20/2023 due to abdominal pain in the setting of left upper quadrant fluid collection underwent IR guided imaging discharge on 05/23/2023.  Patient presenting back due to ongoing left upper quadrant abdominal pain and fever.  Patient seen in the ED urinalysis with specific gravity 1.046.  Lipase at 182, CBC with a white count of 18 with 75% neutrophils hemoglobin of 9.5.  Lactic acid level normal.  Troponin normal.  Comprehensive metabolic profile with a sodium of 132 otherwise unremarkable. -CT abdomen and pelvis done with persistent large left upper quadrant fluid collection measuring 13.5 x 9.8 cm.  Some stranding formation around pancreatic body and tail concerning for acute pancreatitis.  Secondary wall thickening/inflammation along the greater curvature of the stomach with 4.2 x 1.5 fluid collection posterior to the gastric wall.  Patient admitted.  General surgery consulted.   Assessment & Plan:   Principal Problem:   Abdominal hematoma-hematoma of the splenic fossa Active Problems:   Status post splenectomy   SIRS (systemic inflammatory response syndrome) (HCC)   Chronic pancreatitis (HCC)   Acute pancreatitis   Tobacco abuse   Chronic alcohol abuse   Hyperlipidemia   Malnutrition of moderate degree   Pancreatic pseudocyst   Essential hypertension   Type 2 diabetes mellitus (HCC)   Hyponatremia    Normocytic anemia   Thrombocytosis   #1 splenic bed hematoma status post recent splenectomy 04/21/2023 -Patient presenting with left upper quadrant abdominal pain, subjective fevers stating had temps as high as 101 however afebrile here. -CT abdomen and pelvis done on admission with persistent large left upper quadrant fluid collection measuring 13.5 x 9.8 cm.  Stranding/inflammation around pancreatic body and tail concerning for acute pancreatitis.  Secondary wall thickening/inflammation along the greater curvature of the stomach with 4.2 x 1.5 cm fluid collection posterior to the gastric wall. -Patient still with complaints of left upper quadrant abdominal pain and feels something else needs to be done about this hematoma. -Leukocytosis trending down.  Afebrile. -Patient seen in consultation by general surgery who have reviewed films. -It is noted by general surgery that hematoma did not drain well with large drain placement during hospitalization and therefore do not recommend draining at this point in time. -Per general surgery patient spikes a fever or show signs of infection then may consider antibiotics and IR consult for drainage. -Blood cultures obtained and pending.  Urinalysis nitrite negative, leukocytes negative. -General Surgery adjusted pain regimen and added multimodal regimen including scheduled Tylenol, gabapentin, Robaxin, as needed oxycodone, Toradol and Dilaudid. -Per general surgery no surgical intervention needed at this time.  2.  Subjective fevers -Patient with complaints of subjective fevers prior to admission. -Chest x-ray negative for any acute infiltrate, patient with no respiratory symptoms. -Urinalysis nitrite negative leukocytes negative. -Blood cultures ordered and pending. -Hold off on antibiotics at this time.  3.??  Acute pancreatitis -Noted on CT abdomen and pelvis. -Patient noted to have elevated lipase levels on admission  as high as 182 which have  trended down currently at 60. -Patient denies any epigastric pain prior to admission and denies any associated pancreatitis pain. -Currently tolerating clear liquids and asking for diet to be advanced. -Advance to a regular diet. -Saline lock IV fluids. -Supportive care.  4.  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 -Serum osmolality noted at 267, urine osmolality noted at 476.  Urine sodium of 40, urine creatinine of 119. -TSH within normal limits at 1.302. -Sodium level at 125 this morning from 132 on admission. -Sodium noted at 127 on 05/23/2023. -Saline lock IV fluids. -Fluid restriction to 1500 cc/day. -Repeat labs in the AM.  5.  Possible pancreatic cyst/pancreatic pseudocyst -Supportive care. -May consider GI evaluation if persistent symptoms.  6.  Chronic alcohol abuse -Patient currently with no signs of withdrawal noted on examination. -Place on thiamine, folic acid, multivitamin. -Place on Ativan as needed withdrawal protocol.  7.  Hyperlipidemia -Lifestyle modification -Outpatient follow-up with PCP.  8.  Hypertension -Continue losartan.  9.  Malnutrition of moderate degree -In the setting of anemia/recent surgery. -Diet advanced to regular diet. -Follow for now.  10.  Type 2 diabetes mellitus -Hemoglobin A1c 7.0 (04/21/2023) -CBG noted at 138 this morning. -Discontinue metformin. -SSI.  11.  Thrombocytosis -In the setting of anemia and recent splenectomy. -Follow.  12.  Tobacco abuse -Tobacco cessation -Continue nicotine patch.     DVT prophylaxis: SCDs Code Status: Full Family Communication: Updated patient.  No family at bedside. Disposition: Likely home when cleared by general surgery.  Status is: Inpatient Remains inpatient appropriate because: Severity of illness   Consultants:  General Surgery: Dr. Fredricka Bonine 06/01/2023  Procedures:  CT abdomen and pelvis 05/31/2023 Chest x-ray  05/31/2023    Antimicrobials:  Anti-infectives (From admission, onward)    None         Subjective: Patient currently frustrated that nothing is being done about hematoma in the left upper quadrant.  Patient with complaints of left upper quadrant pain radiating to his back, flank, left shoulder and neck.  States does not have any pancreatic pain and feels pancreatitis noted on scan likely secondary to left upper quadrant hematoma noted.  Denies any nausea or vomiting.  No chest pain.  No shortness of breath.  Would like his diet upgraded.  Upset about plan for left upper quadrant hematoma.  Objective: Vitals:   06/02/23 0500 06/02/23 1255 06/02/23 1704 06/02/23 1856  BP:  (!) 97/51 (!) 126/59   Pulse:  71 79   Resp:  17 16   Temp:  98.2 F (36.8 C) 98.5 F (36.9 C) 99 F (37.2 C)  TempSrc:  Oral Oral Oral  SpO2:  100% 97%   Weight: 65.9 kg     Height:        Intake/Output Summary (Last 24 hours) at 06/02/2023 1909 Last data filed at 06/02/2023 1708 Gross per 24 hour  Intake 2632.82 ml  Output 250 ml  Net 2382.82 ml   Filed Weights   05/31/23 1839 06/01/23 0343 06/02/23 0500  Weight: 70 kg 63.7 kg 65.9 kg    Examination:  General exam: Appears calm and comfortable  Respiratory system: Clear to auscultation. Respiratory effort normal. Cardiovascular system: S1 & S2 heard, RRR. No JVD, murmurs, rubs, gallops or clicks. No pedal edema. Gastrointestinal system: Abdomen is nondistended, soft and some tenderness to palpation in the left upper quadrant.  Positive bowel sounds.  No rebound.  No guarding.  Scar noted on  left upper quadrant.  Central nervous system: Alert and oriented. No focal neurological deficits. Extremities: Symmetric 5 x 5 power. Skin: No rashes, lesions or ulcers Psychiatry: Judgement and insight appear normal. Mood & affect appropriate.     Data Reviewed: I have personally reviewed following labs and imaging studies  CBC: Recent Labs  Lab  05/31/23 1828 06/01/23 1327 06/02/23 0602  WBC 18.0* 18.0* 16.4*  NEUTROABS 13.4* 12.8*  --   HGB 9.5* 8.5* 7.6*  HCT 29.5* 26.8* 23.6*  MCV 84.0 86.7 87.1  PLT 868* 813* 759*    Basic Metabolic Panel: Recent Labs  Lab 05/31/23 1828 06/01/23 1327 06/02/23 0602  NA 132* 129* 125*  K 3.9 4.4 4.1  CL 97* 95* 91*  CO2 23 24 27   GLUCOSE 116* 104* 77  BUN 10 12 9   CREATININE 0.46* 0.43* 0.44*  CALCIUM 9.3 8.6* 8.2*  MG  --  1.9  --     GFR: Estimated Creatinine Clearance: 91.5 mL/min (A) (by C-G formula based on SCr of 0.44 mg/dL (L)).  Liver Function Tests: Recent Labs  Lab 05/31/23 1828 06/01/23 1327 06/02/23 0602  AST 24 15 18   ALT 23 19 15   ALKPHOS 95 86 95  BILITOT 0.5 0.7 0.6  PROT 6.7 6.1* 5.7*  ALBUMIN 3.4* 2.6* 2.4*    CBG: Recent Labs  Lab 06/01/23 1123 06/01/23 1744 06/02/23 0012 06/02/23 0413 06/02/23 1130  GLUCAP 97 141* 103* 138* 82     No results found for this or any previous visit (from the past 240 hour(s)).       Radiology Studies: CT ABDOMEN PELVIS W CONTRAST  Result Date: 05/31/2023 CLINICAL DATA:  Abdominal pain, chest pain, fever EXAM: CT ABDOMEN AND PELVIS WITH CONTRAST TECHNIQUE: Multidetector CT imaging of the abdomen and pelvis was performed using the standard protocol following bolus administration of intravenous contrast. RADIATION DOSE REDUCTION: This exam was performed according to the departmental dose-optimization program which includes automated exposure control, adjustment of the mA and/or kV according to patient size and/or use of iterative reconstruction technique. CONTRAST:  80mL OMNIPAQUE IOHEXOL 300 MG/ML  SOLN COMPARISON:  05/20/2023 FINDINGS: Lower chest: Trace left pleural effusion. Left base atelectasis. Findings stable since prior study. Hepatobiliary: No focal hepatic abnormality. Gallbladder unremarkable. Pancreas: Calcifications in the pancreatic head. Stranding around the pancreas, new since prior study,  best seen around the body and tail suspicious for acute pancreatitis. Fluid also noted tracking along the greater curvature of the stomach with small focal fluid collection posterior to the stomach measuring 4.2 x 1.5 cm. Spleen: Reported prior splenectomy. There appears to be some residual splenic tissue. Large fluid collection in the left upper quadrant measuring 13.5 x 9.8 cm. Previously seen pigtail drainage catheter within this fluid collection no longer visualized. Adrenals/Urinary Tract: No adrenal abnormality. No focal renal abnormality. No stones or hydronephrosis. Urinary bladder is unremarkable. Stomach/Bowel: Mild wall thickening of along the greater curvature of the stomach, likely secondary inflammation related to inflamed pancreas. Normal appendix. Vascular/Lymphatic: No evidence of aneurysm or adenopathy. Reproductive: No visible focal abnormality. Other: No free fluid or free air. Musculoskeletal: No acute bony abnormality. IMPRESSION: Persistent large left upper quadrant fluid collection measuring 13.5 x 9.8 cm. Previously placed pigtail drainage catheter no longer visualized within the fluid collection. Stranding/inflammation around the pancreatic body and tail concerning for acute pancreatitis. Secondary wall thickening/inflammation along the greater curvature of the stomach with 4.2 x 1.5 cm fluid collection posterior to the gastric wall. Trace left pleural  effusion, left base atelectasis. Electronically Signed   By: Charlett Nose M.D.   On: 05/31/2023 23:38   DG Chest Port 1 View  Result Date: 05/31/2023 CLINICAL DATA:  Central chest pain cough, fever. EXAM: PORTABLE CHEST 1 VIEW COMPARISON:  05/20/2023. FINDINGS: The heart size and mediastinal contours are within normal limits. Mild atelectasis or scarring is noted at the left lung base. No effusion or pneumothorax is seen. No acute osseous abnormality. IMPRESSION: Mild atelectasis or scarring at the left lung base. Electronically Signed    By: Thornell Sartorius M.D.   On: 05/31/2023 22:15        Scheduled Meds:  acetaminophen  1,000 mg Oral Q6H   DULoxetine  20 mg Oral Daily   gabapentin  300 mg Oral TID   insulin aspart  0-9 Units Subcutaneous Q6H   losartan  25 mg Oral Daily   methocarbamol  750 mg Oral TID   nicotine  21 mg Transdermal Daily   pantoprazole (PROTONIX) IV  40 mg Intravenous Q24H   senna-docusate  2 tablet Oral BID   Continuous Infusions:     LOS: 1 day    Time spent: 40 minutes    Ramiro Harvest, MD [facility]   To contact the attending provider between 7A-7P or the covering provider during after hours 7P-7A, please log into the web site [email or web address] and access using universal [place] password for that web site. If you do not have the password, please call the hospital operator.  06/02/2023, 7:09 PM

## 2023-06-02 NOTE — Plan of Care (Signed)
  Problem: Nutrition: Goal: Adequate nutrition will be maintained Outcome: Completed/Met

## 2023-06-02 NOTE — Plan of Care (Signed)
  Problem: Education: Goal: Knowledge of General Education information will improve Description: Including pain rating scale, medication(s)/side effects and non-pharmacologic comfort measures Outcome: Progressing   Problem: Health Behavior/Discharge Planning: Goal: Ability to manage health-related needs will improve Outcome: Progressing   Problem: Clinical Measurements: Goal: Diagnostic test results will improve Outcome: Progressing   

## 2023-06-03 DIAGNOSIS — S301XXA Contusion of abdominal wall, initial encounter: Secondary | ICD-10-CM | POA: Diagnosis not present

## 2023-06-03 DIAGNOSIS — E871 Hypo-osmolality and hyponatremia: Secondary | ICD-10-CM | POA: Diagnosis not present

## 2023-06-03 DIAGNOSIS — K859 Acute pancreatitis without necrosis or infection, unspecified: Secondary | ICD-10-CM | POA: Diagnosis not present

## 2023-06-03 DIAGNOSIS — D75839 Thrombocytosis, unspecified: Secondary | ICD-10-CM | POA: Diagnosis not present

## 2023-06-03 LAB — VITAMIN B12: Vitamin B-12: 400 pg/mL (ref 180–914)

## 2023-06-03 LAB — COMPREHENSIVE METABOLIC PANEL
ALT: 19 U/L (ref 0–44)
AST: 23 U/L (ref 15–41)
Albumin: 2.5 g/dL — ABNORMAL LOW (ref 3.5–5.0)
Alkaline Phosphatase: 131 U/L — ABNORMAL HIGH (ref 38–126)
Anion gap: 7 (ref 5–15)
BUN: 7 mg/dL (ref 6–20)
CO2: 27 mmol/L (ref 22–32)
Calcium: 8.2 mg/dL — ABNORMAL LOW (ref 8.9–10.3)
Chloride: 94 mmol/L — ABNORMAL LOW (ref 98–111)
Creatinine, Ser: 0.44 mg/dL — ABNORMAL LOW (ref 0.61–1.24)
GFR, Estimated: >60 mL/min (ref >60)
Glucose, Bld: 128 mg/dL — ABNORMAL HIGH (ref 70–99)
Potassium: 3.7 mmol/L (ref 3.5–5.1)
Sodium: 128 mmol/L — ABNORMAL LOW (ref 135–145)
Total Bilirubin: 0.6 mg/dL (ref <1.2)
Total Protein: 5.9 g/dL — ABNORMAL LOW (ref 6.5–8.1)

## 2023-06-03 LAB — IRON AND TIBC
Iron: 10 ug/dL — ABNORMAL LOW (ref 45–182)
Saturation Ratios: 6 % — ABNORMAL LOW (ref 17.9–39.5)
TIBC: 168 ug/dL — ABNORMAL LOW (ref 250–450)
UIBC: 158 ug/dL

## 2023-06-03 LAB — CBC WITH DIFFERENTIAL/PLATELET
Abs Immature Granulocytes: 0.07 10*3/uL (ref 0.00–0.07)
Basophils Absolute: 0.1 10*3/uL (ref 0.0–0.1)
Basophils Relative: 0 %
Eosinophils Absolute: 0.3 10*3/uL (ref 0.0–0.5)
Eosinophils Relative: 2 %
HCT: 23.6 % — ABNORMAL LOW (ref 39.0–52.0)
Hemoglobin: 7.4 g/dL — ABNORMAL LOW (ref 13.0–17.0)
Immature Granulocytes: 1 %
Lymphocytes Relative: 19 %
Lymphs Abs: 2.6 10*3/uL (ref 0.7–4.0)
MCH: 27.4 pg (ref 26.0–34.0)
MCHC: 31.4 g/dL (ref 30.0–36.0)
MCV: 87.4 fL (ref 80.0–100.0)
Monocytes Absolute: 2 10*3/uL — ABNORMAL HIGH (ref 0.1–1.0)
Monocytes Relative: 15 %
Neutro Abs: 8.6 10*3/uL — ABNORMAL HIGH (ref 1.7–7.7)
Neutrophils Relative %: 63 %
Platelets: 749 10*3/uL — ABNORMAL HIGH (ref 150–400)
RBC: 2.7 MIL/uL — ABNORMAL LOW (ref 4.22–5.81)
RDW: 15.4 % (ref 11.5–15.5)
WBC: 13.5 10*3/uL — ABNORMAL HIGH (ref 4.0–10.5)
nRBC: 0 % (ref 0.0–0.2)

## 2023-06-03 LAB — GLUCOSE, CAPILLARY
Glucose-Capillary: 119 mg/dL — ABNORMAL HIGH (ref 70–99)
Glucose-Capillary: 184 mg/dL — ABNORMAL HIGH (ref 70–99)
Glucose-Capillary: 113 mg/dL — ABNORMAL HIGH (ref 70–99)

## 2023-06-03 LAB — LIPASE, BLOOD: Lipase: 53 U/L — ABNORMAL HIGH (ref 11–51)

## 2023-06-03 LAB — FERRITIN: Ferritin: 377 ng/mL — ABNORMAL HIGH (ref 24–336)

## 2023-06-03 LAB — MAGNESIUM: Magnesium: 1.8 mg/dL (ref 1.7–2.4)

## 2023-06-03 LAB — URINE CULTURE: Culture: <10000 — AB

## 2023-06-03 LAB — FOLATE: Folate: 14.1 ng/mL (ref >5.9)

## 2023-06-03 MED ORDER — ONDANSETRON HCL 4 MG PO TABS: 4.0000 mg | ORAL_TABLET | Freq: Three times a day (TID) | ORAL | 0 refills | Status: AC | PRN

## 2023-06-03 MED ORDER — PANTOPRAZOLE SODIUM 40 MG PO TBEC: 40.0000 mg | DELAYED_RELEASE_TABLET | Freq: Every day | ORAL | 1 refills | Status: AC

## 2023-06-03 MED ORDER — OXYCODONE HCL 5 MG PO TABS: 5.0000 mg | ORAL_TABLET | Freq: Four times a day (QID) | ORAL | 0 refills | Status: AC | PRN

## 2023-06-03 MED ORDER — METHOCARBAMOL 500 MG PO TABS: 500.0000 mg | ORAL_TABLET | Freq: Four times a day (QID) | ORAL | 0 refills | Status: AC | PRN

## 2023-06-03 MED ORDER — OXYCODONE HCL 5 MG PO TABS: 5.0000 mg | ORAL_TABLET | Freq: Four times a day (QID) | ORAL | 0 refills | Status: DC | PRN

## 2023-06-03 MED ORDER — PANTOPRAZOLE SODIUM 40 MG PO TBEC
40.0000 mg | DELAYED_RELEASE_TABLET | Freq: Every day | ORAL | Status: DC
  Administered 2023-06-03: 40 mg via ORAL
  Filled 2023-06-03: qty 1

## 2023-06-03 NOTE — Plan of Care (Signed)
  Problem: Activity: Goal: Risk for activity intolerance will decrease Outcome: Completed/Met   Problem: Nutritional: Goal: Maintenance of adequate nutrition will improve Outcome: Adequate for Discharge

## 2023-06-03 NOTE — Discharge Summary (Signed)
Physician Discharge Summary  Samuel Abbott FIE:[id number] DOB: 1960/11/15 DOA: 05/31/2023  PCP: Norm Salt, PA  Admit date: 05/31/2023 Discharge date: 06/03/2023  Time spent: 60 minutes  Recommendations for Outpatient Follow-up:  Follow-up with Dr. [name], general surgery on 06/21/2023 at 9:40 AM. Follow-up with Norm Salt, PA in 2 weeks.  On follow-up patient need a CBC done to follow-up on counts.  Patient will need a basic metabolic profile done to follow-up on electrolytes and renal function.   Discharge Diagnoses:  Principal Problem:   Abdominal hematoma-hematoma of the splenic fossa Active Problems:   Status post splenectomy   SIRS (systemic inflammatory response syndrome) (HCC)   Chronic pancreatitis (HCC)   Acute pancreatitis   Tobacco abuse   Chronic alcohol abuse   Hyperlipidemia   Malnutrition of moderate degree   Pancreatic pseudocyst   Essential hypertension   Type 2 diabetes mellitus (HCC)   Hyponatremia   Normocytic anemia   Thrombocytosis   Discharge Condition: Stable and improved.  Diet recommendation: Carb modified  Filed Weights   05/31/23 1839 06/01/23 0343 06/02/23 0500  Weight: 70 kg 63.7 kg 65.9 kg    History of present illness:  HPI per Dr. [name] is a 62 y.o. male with medical history significant of type 2 diabetes, eczema, hyperlipidemia, hypertension, tobacco abuse, alcohol abuse who presented to the emergency department with abdominal/chest pain associated with nausea, vomiting and fever.  He was recently admitted and discharged due to abdominal pain in the setting of a splenic bed hematoma undergoing a splenectomy on 04/21/2023.  Readmitted on 05/20/2023 due to abdominal pain in the setting of LUQ fluid collection, underwent IR guided imaging and discharged on 05/23/2023.  He returned yesterday evening due to pain and switch to fever.  He stated he has not been drinking.  No diarrhea, melena or  hematochezia. He denied fever, chills, rhinorrhea, sore throat, wheezing or hemoptysis.  No chest pain, palpitations, diaphoresis, PND, orthopnea or pitting edema of the lower extremities. No frequency or hematuria has been having dysuria.     Lab work: Urine analysis shows specific gravity greater than 1.046 and protein of 30 mg/dL.  Lipase was 182 units/L.  CBC showed a white count of 18.0 with 75% neutrophils, hemoglobin 9.5 g/dL platelets 166.  Lactic acid and troponin x 2 normal.  CMP showed a 732 and chloride 97 mmol/L, the rest of the electrolytes and renal function were unremarkable.  Albumin was 3.4 g/dL, LFTs otherwise normal.   Imaging: Portable 1 view chest radiograph with mild atelectasis or scarring at the left lung base.  CT abdomen and/pelvis with contrast showing a persistent large left upper quadrant fluid collection measuring 13.5 x 9.8 cm.  Previously placed pigtail drainage catheter no longer visualized within the fluid collection.  There is a stranding/inflammation around the pancreatic body and tail concerning for acute pancreatitis.  Secondary wall thickening/inflammation along the greater curvature of the stomach with a 4.2 x 1.5 cm fluid collection posterior to the gastric wall.  Trace left pleural effusion, left base atelectasis.   ED course: Initial vital signs were temperature 98.7 F, pulse 89, respiration 18, BP 114/67 mmHg O2 sat 98% on room air.  The patient received hydromorphone 1 mg IVP x 2 and ondansetron 4 mg IVP.  He was also started on LR 100 mL/h.   Hospital Course:  #1 splenic bed hematoma status post recent splenectomy 04/21/2023 -Patient presented with left upper quadrant abdominal pain, subjective fevers  stating had temps as high as 101 however afebrile here. -CT abdomen and pelvis done on admission with persistent large left upper quadrant fluid collection measuring 13.5 x 9.8 cm.  Stranding/inflammation around pancreatic body and tail concerning for acute  pancreatitis.  Secondary wall thickening/inflammation along the greater curvature of the stomach with 4.2 x 1.5 cm fluid collection posterior to the gastric wall. -Patient still with complaints of left upper quadrant abdominal pain and feels something else needs to be done about this hematoma. -Leukocytosis trended down.  Patient remained afebrile. -Patient seen in consultation by general surgery who have reviewed films. -It is noted by general surgery that hematoma did not drain well with large drain placement during hospitalization and therefore do not recommend draining at this point in time. -Per general surgery if patient spikes a fever or show signs of infection then may consider antibiotics and IR consult for drainage. -Blood cultures obtained with no growth to date by day of discharge.  Urinalysis nitrite negative, leukocytes negative. -General Surgery adjusted pain regimen and added multimodal regimen including scheduled Tylenol, gabapentin, Robaxin, as needed oxycodone, Toradol and Dilaudid. -Per general surgery no surgical intervention needed at this time. -Patient assessed by general surgery and cleared for discharge with outpatient follow-up.  Patient had also stated second opinion which general surgery will arrange. -Patient will be discharged in stable condition.   2.  Subjective fevers -Patient with complaints of subjective fevers prior to admission. -Chest x-ray negative for any acute infiltrate, patient with no respiratory symptoms. -Urinalysis nitrite negative leukocytes negative. -Blood cultures ordered with no growth to date.  Patient remained afebrile.  Leukocytosis trended down.   -Outpatient follow-up with PCP.    3.??  Acute pancreatitis -Noted on CT abdomen and pelvis. -Patient noted to have elevated lipase levels on admission as high as 182 which trended down during the hospitalization.   -Patient denied any epigastric pain prior to admission and denied any associated  pancreatitis pain. -Patient initially placed on clear liquids diet advanced to a regular diet which she tolerated without any epigastric or abdominal pain.   -Outpatient follow-up.   4.  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 -Serum osmolality noted at 267, urine osmolality noted at 476.  Urine sodium of 40, urine creatinine of 119. -TSH within normal limits at 1.302. -Sodium level at 125 on 06/02/2023 from 132 on admission.   -IV fluids saline lock, patient placed on a fluid restriction of 1500 cc/day with improvement with hyponatremia such that by day of discharge sodium was 128.  -Patient remained asymptomatic.  -Outpatient follow-up with PCP.    5.  Possible pancreatic cyst/pancreatic pseudocyst -Supportive care. -Outpatient follow-up.   6.  Chronic alcohol abuse -Patient during the hospitalization had no signs of alcohol withdrawal and maintained on thiamine, folic acid, multivitamin.   -Patient also placed on Ativan as needed.   -Alcohol cessation stressed to patient.   -Outpatient follow-up.    7.  Hyperlipidemia -Lifestyle modification -Outpatient follow-up with PCP.   8.  Hypertension -Patient maintained on home regimen losartan.   9.  Malnutrition of moderate degree -In the setting of anemia/recent surgery. -Diet advanced to regular diet which patient tolerated.   10.  Type 2 diabetes mellitus -Hemoglobin A1c 7.0 (04/21/2023) -Metformin held during the hospitalization and patient maintained on SSI.     11.  Thrombocytosis -In the setting of anemia and recent splenectomy. -Outpatient follow-up with PCP.    12.  Tobacco abuse -Tobacco cessation -  Patient placed on nicotine patch.   -Outpatient follow-up with PCP.        Procedures: CT abdomen and pelvis 05/31/2023 Chest x-ray 05/31/2023    Consultations: General Surgery: Dr. Fredricka Bonine 06/01/2023  Discharge Exam: Vitals:   06/03/23 0550 06/03/23 0902  BP: (!) 107/53  107/66  Pulse: 69 70  Resp: 18   Temp: 99.4 F (37.4 C)   SpO2: 98%     General: NAD. Cardiovascular: RRR no murmurs rubs or gallops.  No JVD.  No lower extremity edema. Respiratory: Clear to auscultation bilaterally.  No wheezes, no crackles, no rhonchi.  Fair air movement.  Speaking in full sentences.  Discharge Instructions   Discharge Instructions     Diet Carb Modified   Complete by: As directed    Increase activity slowly   Complete by: As directed       Allergies as of 06/03/2023       Reactions   Amoxil [amoxicillin] Anaphylaxis   Penicillins Anaphylaxis   Motrin [ibuprofen] Other (See Comments)   Stomach pain after several uses of 800mg  tablets.        Medication List     TAKE these medications    acetaminophen 500 MG tablet Commonly known as: TYLENOL Take 2 tablets (1,000 mg total) by mouth every 6 (six) hours as needed for mild pain, moderate pain, fever or headache.   ALPRAZolam 0.25 MG tablet Commonly known as: XANAX Take 1 tablet by mouth 2 (two) times daily as needed for anxiety.   B-12 PO Take 1 tablet by mouth daily.   D3 PO Take 1 tablet by mouth daily.   DULoxetine 20 MG capsule Commonly known as: CYMBALTA Take 20 mg by mouth daily. For back pain   IRON PO Take 1 tablet by mouth daily.   losartan 25 MG tablet Commonly known as: COZAAR Take 25 mg by mouth daily.   metFORMIN 500 MG tablet Commonly known as: Glucophage Take 1 tablet (500 mg total) by mouth daily with breakfast. What changed:  how much to take when to take this   methocarbamol 500 MG tablet Commonly known as: ROBAXIN Take 1 tablet (500 mg total) by mouth every 6 (six) hours as needed for muscle spasms. What changed: when to take this   multivitamin tablet Take 1 tablet by mouth daily.   ondansetron 4 MG tablet Commonly known as: Zofran Take 1 tablet (4 mg total) by mouth every 8 (eight) hours as needed for nausea or vomiting.   oxyCODONE 5 MG immediate  release tablet Commonly known as: Oxy IR/ROXICODONE Take 1-2 tablets (5-10 mg total) by mouth every 6 (six) hours as needed for severe pain (pain score 7-10). What changed:  how much to take reasons to take this   pantoprazole 40 MG tablet Commonly known as: PROTONIX Take 1 tablet (40 mg total) by mouth daily. Start taking on: June 04, 2023   Senna-S 8.6-50 MG tablet Generic drug: senna-docusate Take 2 tablets by mouth at bedtime as needed for mild constipation.   THERAFLU FLU/COLD PO Take 1 Dose by mouth 2 (two) times daily as needed (Cold sx).   tiZANidine 4 MG tablet Commonly known as: ZANAFLEX Take 4 mg by mouth every 8 (eight) hours as needed for muscle spasms.       Allergies  Allergen Reactions   Amoxil [Amoxicillin] Anaphylaxis   Penicillins Anaphylaxis   Motrin [Ibuprofen] Other (See Comments)    Stomach pain after several uses of 800mg  tablets.  Follow-up Information     [name], MD. Go on 06/21/2023.   Specialty: General Surgery Why: Your appointment is 12/9 at 9:40am Arrive 15 minutes early for check in Contact information: [address] [place] [address] [phone]         Norm Salt, [place]. Schedule an appointment as soon as possible for a visit in 2 week(s).   Specialty: Physician Assistant Contact information: [address] [place] [address] [phone]                  The results of significant diagnostics from this hospitalization (including imaging, microbiology, ancillary and laboratory) are listed below for reference.    Significant Diagnostic Studies: CT ABDOMEN PELVIS W CONTRAST  Result Date: 05/31/2023 CLINICAL DATA:  Abdominal pain, chest pain, fever EXAM: CT ABDOMEN AND PELVIS WITH CONTRAST TECHNIQUE: Multidetector CT imaging of the abdomen and pelvis was performed using the standard protocol following bolus administration of intravenous contrast. RADIATION DOSE REDUCTION:  This exam was performed according to the departmental dose-optimization program which includes automated exposure control, adjustment of the mA and/or kV according to patient size and/or use of iterative reconstruction technique. CONTRAST:  80mL OMNIPAQUE IOHEXOL 300 MG/ML  SOLN COMPARISON:  05/20/2023 FINDINGS: Lower chest: Trace left pleural effusion. Left base atelectasis. Findings stable since prior study. Hepatobiliary: No focal hepatic abnormality. Gallbladder unremarkable. Pancreas: Calcifications in the pancreatic head. Stranding around the pancreas, new since prior study, best seen around the body and tail suspicious for acute pancreatitis. Fluid also noted tracking along the greater curvature of the stomach with small focal fluid collection posterior to the stomach measuring 4.2 x 1.5 cm. Spleen: Reported prior splenectomy. There appears to be some residual splenic tissue. Large fluid collection in the left upper quadrant measuring 13.5 x 9.8 cm. Previously seen pigtail drainage catheter within this fluid collection no longer visualized. Adrenals/Urinary Tract: No adrenal abnormality. No focal renal abnormality. No stones or hydronephrosis. Urinary bladder is unremarkable. Stomach/Bowel: Mild wall thickening of along the greater curvature of the stomach, likely secondary inflammation related to inflamed pancreas. Normal appendix. Vascular/Lymphatic: No evidence of aneurysm or adenopathy. Reproductive: No visible focal abnormality. Other: No free fluid or free air. Musculoskeletal: No acute bony abnormality. IMPRESSION: Persistent large left upper quadrant fluid collection measuring 13.5 x 9.8 cm. Previously placed pigtail drainage catheter no longer visualized within the fluid collection. Stranding/inflammation around the pancreatic body and tail concerning for acute pancreatitis. Secondary wall thickening/inflammation along the greater curvature of the stomach with 4.2 x 1.5 cm fluid collection posterior  to the gastric wall. Trace left pleural effusion, left base atelectasis. Electronically Signed   By: Charlett Nose M.D.   On: 05/31/2023 23:38   DG Chest Port 1 View  Result Date: 05/31/2023 CLINICAL DATA:  Central chest pain cough, fever. EXAM: PORTABLE CHEST 1 VIEW COMPARISON:  05/20/2023. FINDINGS: The heart size and mediastinal contours are within normal limits. Mild atelectasis or scarring is noted at the left lung base. No effusion or pneumothorax is seen. No acute osseous abnormality. IMPRESSION: Mild atelectasis or scarring at the left lung base. Electronically Signed   By: Thornell Sartorius M.D.   On: 05/31/2023 22:15   CT GUIDED PERITONEAL/RETROPERITONEAL FLUID DRAIN BY PERC CATH  Result Date: 05/20/2023 INDICATION: 62 year old gentleman with left upper quadrant hematoma/fluid collection status post splenectomy presented to the ED with intractable abdominal pain. IR consulted for drain placement. EXAM: CT-guided left upper quadrant drain placement TECHNIQUE: Multidetector CT  imaging of the abdomen was performed following the standard protocol without IV contrast. RADIATION DOSE REDUCTION: This exam was performed according to the departmental dose-optimization program which includes automated exposure control, adjustment of the mA and/or kV according to patient size and/or use of iterative reconstruction technique. MEDICATIONS: The patient is currently admitted to the hospital and receiving intravenous antibiotics. The antibiotics were administered within an appropriate time frame prior to the initiation of the procedure. ANESTHESIA/SEDATION: Moderate (conscious) sedation was employed during this procedure. A total of Versed 4 mg and Fentanyl 100 mcg was administered intravenously by the radiology nurse. Total intra-service moderate Sedation Time: 22 minutes. The patient's level of consciousness and vital signs were monitored continuously by radiology nursing throughout the procedure under my direct  supervision. COMPLICATIONS: None immediate. PROCEDURE: Informed written consent was obtained from the patient after a thorough discussion of the procedural risks, benefits and alternatives. All questions were addressed. Maximal Sterile Barrier Technique was utilized including caps, mask, sterile gowns, sterile gloves, sterile drape, hand hygiene and skin antiseptic. A timeout was performed prior to the initiation of the procedure. Patient positioned supine on the procedure table. The left upper quadrant skin prepped and draped in the usual fashion. Following local administration, the left upper quadrant fluid collection was accessed with a 19 gauge Yueh needle utilizing CT guidance. Yueh needle was removed over 0.035 inch guidewire. Serial dilation was performed followed by placement of 12 [place] multipurpose pigtail drain. Approximately 400 mL of bloody fluid was aspirated. Samples were sent for Gram stain and culture. IMPRESSION: Successful CT-guided insertion of 12 French multipurpose pigtail drain in symptomatic left upper quadrant post splenectomy hematoma. Electronically Signed   By: Acquanetta Belling M.D.   On: 05/20/2023 15:48   CT Angio Chest/Abd/Pel for Dissection W and/or W/WO  Addendum Date: 05/20/2023   ADDENDUM REPORT: 05/20/2023 02:02 ADDENDUM: Additional history was provided in that the patient has recently undergone splenectomy via a chevron incision. In light of this, the collection seen within the left upper quadrant represents a large hematoma within the splenic bed measuring 9.8 x 14.1 x 10.6 cm (volume = 770 cm^3) with a more hyperdense region centrally possibly representing sentinel clot or hemostatic packing material. There are punctate foci of gas within the hematoma at the superior margin though this is nonspecific and may simply represent gas related to open splenectomy. Super infection, however, is not excluded on this examination alone and correlation with the patient's clinical picture  is recommended. If indicated, diagnostic sampling of the hematoma for microbial analysis could be considered. Electronically Signed   By: Helyn Numbers M.D.   On: 05/20/2023 02:02   Result Date: 05/20/2023 CLINICAL DATA:  Acute aortic syndrome, abdominal pain, diaphoresis. 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 Multidetector CT imaging through the chest, abdomen and pelvis was performed using the standard protocol during bolus administration of intravenous contrast. Multiplanar reconstructed images and MIPs were obtained and reviewed to evaluate the vascular anatomy. RADIATION DOSE REDUCTION: This exam was performed according to the departmental dose-optimization program which includes automated exposure control, adjustment of the mA and/or kV according to patient size and/or use of iterative reconstruction technique. CONTRAST:  OMNIPAQUE IOHEXOL 350 MG/ML SOLN COMPARISON:  04/20/2023. FINDINGS: CTA CHEST FINDINGS Cardiovascular: Preferential opacification of the thoracic aorta. No evidence of thoracic aortic aneurysm or dissection. Normal heart size. No pericardial effusion. Mediastinum/Nodes: No enlarged mediastinal,  hilar, or axillary lymph nodes. Thyroid gland, trachea, and esophagus demonstrate no significant findings. Lungs/Pleura: Mild emphysema. No confluent pulmonary infiltrate. No pneumothorax or pleural effusion. No central obstructing lesion. Musculoskeletal: No chest wall abnormality. No acute or significant osseous findings. Review of the MIP images confirms the above findings. CTA ABDOMEN AND PELVIS FINDINGS VASCULAR Aorta: Normal caliber aorta without aneurysm, dissection, vasculitis or significant stenosis. Celiac: Since the prior examination, the pseudoaneurysm within the splenic hilum has thrombosed. Additionally, there is markedly 2 decreased  caliber of the splenic artery diffusely which may relate to interval near complete infarction of the spleen described below. The celiac axis itself is widely patent, as is the left gastric artery and common hepatic artery. No aneurysm or dissection. SMA: Patent without evidence of aneurysm, dissection, vasculitis or significant stenosis. Renals: Main renal arteries bilaterally are widely patent and demonstrate normal vascular morphology. Tiny bilateral accessory lower pole renal arteries are noted. IMA: Patent without evidence of aneurysm, dissection, vasculitis or significant stenosis. Inflow: Patent without evidence of aneurysm, dissection, vasculitis or significant stenosis. Veins: No obvious venous abnormality within the limitations of this arterial phase study. Review of the MIP images confirms the above findings. NON-VASCULAR Hepatobiliary: No focal liver abnormality is seen. No gallstones, gallbladder wall thickening, or biliary dilatation. Pancreas: Punctate calcifications within the pancreatic tail, head, and uncinate process are identified in keeping with changes of chronic pancreatitis. No superimposed acute peripancreatic inflammatory changes or fluid collections are identified. Pancreatic duct is not dilated. 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 Small amount of residual enhancing tissue is seen within the splenic hilum. Adrenals/Urinary Tract: Adrenal glands are unremarkable. Kidneys are normal, without renal calculi, focal lesion, or hydronephrosis. Bladder is unremarkable. Stomach/Bowel: Interval development of mildly hyperdense fluid throughout the abdomen in keeping with mild hemoperitoneum. This is of relatively low attenuation, however (16-18 Hounsfield units) suggesting that this may be subacute in nature or reflect underlying anemia. The stomach, small bowel, and large bowel are unremarkable. Appendix normal. No free intraperitoneal gas. Lymphatic: No pathologic adenopathy  within the abdomen and pelvis. Reproductive: Prostate is unremarkable. Other: No abdominal wall hernia Musculoskeletal: Degenerative changes are seen at L1-2. No acute bone abnormality. No lytic or blastic bone lesion. Review of the MIP images confirms the above findings. IMPRESSION: 1. 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 2. Interval development of mild hemoperitoneum throughout the abdomen. This is of relatively low attenuation, however, suggesting that this may be subacute in nature or reflect underlying anemia. 3. No evidence of thoracic or abdominal aortic aneurysm or dissection. 4. Changes of chronic pancreatitis. No superimposed acute peripancreatic inflammatory changes or fluid collections are identified. Emphysema (ICD10-J43.9). Electronically Signed: By: Helyn Numbers M.D. On: 05/20/2023 01:03   DG Chest Port 1 View  Result Date: 05/19/2023 CLINICAL DATA:  Chest and abdominal pain EXAM: PORTABLE CHEST 1 VIEW COMPARISON:  04/22/2023 FINDINGS: Single frontal view of the chest demonstrates a stable cardiac silhouette. Chronic elevation of the left hemidiaphragm. No acute airspace disease, effusion, or pneumothorax. No acute bony abnormality. IMPRESSION: 1. Stable chest, no acute process. Electronically Signed   By: Sharlet Salina M.D.   On: 05/19/2023 23:52    Microbiology: Recent Results (from the past 240 hour(s))  Culture, blood (Routine X 2) w Reflex to ID Panel     Status: None (Preliminary result)   Collection Time: 06/02/23  9:09 AM   Specimen: BLOOD RIGHT ARM  Result Value  Ref Range Status   Specimen Description   Final    BLOOD RIGHT ARM AEROBIC BOTTLE ONLY ANAEROBIC BOTTLE ONLY Performed at [facility], [address] [place], [place] [address]    Special Requests   Final    BOTTLES DRAWN AEROBIC AND ANAEROBIC Blood Culture results may not be optimal due to an inadequate volume of  blood received in culture bottles Performed at [facility], [address] [place], [place] [address]    Culture   Final    NO GROWTH < 24 HOURS Performed at [facility], [address], [place], [place] [address]    Report Status PENDING  Incomplete  Culture, blood (Routine X 2) w Reflex to ID Panel     Status: None (Preliminary result)   Collection Time: 06/02/23  9:20 AM   Specimen: BLOOD LEFT ARM  Result Value Ref Range Status   Specimen Description   Final    BLOOD LEFT ARM AEROBIC BOTTLE ONLY Performed at [facility], [address] [place], [place] [address]    Special Requests   Final    BOTTLES DRAWN AEROBIC ONLY Blood Culture results may not be optimal due to an inadequate volume of blood received in culture bottles Performed at [facility], [address] [address] [place], [place] [address]    Culture   Final    NO GROWTH < 24 HOURS Performed at [facility], [address], [place], [place] [address]    Report Status PENDING  Incomplete     Labs: Basic Metabolic Panel: Recent Labs  Lab 05/31/23 1828 06/01/23 1327 06/02/23 0602 06/03/23 0445  NA 132* 129* 125* 128*  K 3.9 4.4 4.1 3.7  CL 97* 95* 91* 94*  CO2 23 24 27 27   GLUCOSE 116* 104* 77 128*  BUN 10 12 9 7   CREATININE 0.46* 0.43* 0.44* 0.44*  CALCIUM 9.3 8.6* 8.2* 8.2*  MG  --  1.9  --  1.8   Liver Function Tests: Recent Labs  Lab 05/31/23 1828 06/01/23 1327 06/02/23 0602 06/03/23 0445  AST 24 15 18 23   ALT 23 19 15 19   ALKPHOS 95 86 95 131*  BILITOT 0.5 0.7 0.6 0.6  PROT 6.7 6.1* 5.7* 5.9*  ALBUMIN 3.4* 2.6* 2.4* 2.5*   Recent Labs  Lab 05/31/23 1839 06/01/23 1327 06/02/23 0602 06/03/23 0445  LIPASE 182* 68* 60* 53*   No results for input(s): "AMMONIA" in the last 168 hours. CBC: Recent Labs  Lab 05/31/23 1828 06/01/23 1327 06/02/23 0602 06/03/23 0445  WBC 18.0* 18.0* 16.4* 13.5*  NEUTROABS 13.4* 12.8*  --   8.6*  HGB 9.5* 8.5* 7.6* 7.4*  HCT 29.5* 26.8* 23.6* 23.6*  MCV 84.0 86.7 87.1 87.4  PLT 868* 813* 759* 749*   Cardiac Enzymes: No results for input(s): "CKTOTAL", "CKMB", "CKMBINDEX", "TROPONINI" in the last 168 hours. BNP: BNP (last 3 results) Recent Labs    04/22/23 0612 04/23/23 0432  BNP 38.2 46.1    ProBNP (last 3 results) No results for input(s): "PROBNP" in the last 8760 hours.  CBG: Recent Labs  Lab 06/02/23 1130 06/02/23 1745 06/03/23 0047 06/03/23 0547 06/03/23 1143  GLUCAP 82 132* 184* 119* 113*       Signed:  Ramiro Harvest MD.  [facility] 06/03/2023, 12:16 PM

## 2023-06-03 NOTE — TOC Progression Note (Signed)
Transition of Care [facility]) - Progression Note    Patient Details  Name: DARKIEL FEATHERS MRN: [id number] Date of Birth: 1960/11/13  Transition of Care [facility]) CM/SW Contact  Erin Sons, [place] Phone Number: 06/03/2023, 1:43 PM  Clinical Narrative:     CSW met with pt in response to SA consult. Pt did not want SA tx resource list. CSW answered pt's questions about billing as best as possible. Instructed pt to call [facility] billing department at the phone #'s that RN provided him.     Barriers to Discharge: Continued Medical Work up  Expected Discharge Plan and Services         Expected Discharge Date: 06/03/23                                     Social Determinants of Health (SDOH) Interventions SDOH Screenings   Food Insecurity: No Food Insecurity (06/01/2023)  Housing: Patient Declined (06/01/2023)  Transportation Needs: No Transportation Needs (06/01/2023)  Utilities: Not At Risk (06/01/2023)  Tobacco Use: High Risk (06/01/2023)    Readmission Risk Interventions     No data to display

## 2023-06-03 NOTE — Progress Notes (Signed)
Patient ID: Samuel Abbott, male   DOB: Oct 23, 1960, 62 y.o.   MRN: [id number] [facility] Surgery Progress Note     Subjective: CC-  Ongoing LUQ, back, flank, lower chest, shoulder and neck pain. Pain is no worse. He is only taking dilaudid but states "I know the oxycodone will work for me."  WBC down 13.5, no fever  Objective: Vital signs in last 24 hours: Temp:  [98.2 F (36.8 C)-100.4 F (38 C)] 99.4 F (37.4 C) (11/21 0550) Pulse Rate:  [69-89] 70 (11/21 0902) Resp:  [16-18] 18 (11/21 0550) BP: (97-126)/(51-66) 107/66 (11/21 0902) SpO2:  [97 %-100 %] 98 % (11/21 0550) Last BM Date : 05/31/23  Intake/Output from previous day: 11/20 0701 - 11/21 0700 In: 1583.3 [P.O.:1120; I.V.:463.3] Out: -  Intake/Output this shift: No intake/output data recorded.  PE: Gen:  Alert, NAD Pulm:  rate and effort normal on room air Abd: soft, nondistended, nontender. LUQ incision well healed  Lab Results:  Recent Labs    06/02/23 0602 06/03/23 0445  WBC 16.4* 13.5*  HGB 7.6* 7.4*  HCT 23.6* 23.6*  PLT 759* 749*   BMET Recent Labs    06/02/23 0602 06/03/23 0445  NA 125* 128*  K 4.1 3.7  CL 91* 94*  CO2 27 27  GLUCOSE 77 128*  BUN 9 7  CREATININE 0.44* 0.44*  CALCIUM 8.2* 8.2*   PT/INR No results for input(s): "LABPROT", "INR" in the last 72 hours. CMP     Component Value Date/Time   NA 128 (L) 06/03/2023 0445   K 3.7 06/03/2023 0445   CL 94 (L) 06/03/2023 0445   CO2 27 06/03/2023 0445   GLUCOSE 128 (H) 06/03/2023 0445   BUN 7 06/03/2023 0445   CREATININE 0.44 (L) 06/03/2023 0445   CALCIUM 8.2 (L) 06/03/2023 0445   PROT 5.9 (L) 06/03/2023 0445   ALBUMIN 2.5 (L) 06/03/2023 0445   AST 23 06/03/2023 0445   ALT 19 06/03/2023 0445   ALKPHOS 131 (H) 06/03/2023 0445   BILITOT 0.6 06/03/2023 0445   GFRNONAA >60 06/03/2023 0445   GFRAA >60 09/10/2018 0401   Lipase     Component Value Date/Time   LIPASE 53 (H) 06/03/2023 0445       Studies/Results: No  results found.  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 Luisa Hart -Postop fluid collection, likely hematoma - CT scan shows a persistent large left upper quadrant fluid collection 13.5x 9.8 cm; stranding/inflammation around the pancreatic body and tail concerning for acute pancreatitis; secondary wall thickening/inflammation along the greater curvature of the stomach with 4.2 x 1.5 cm fluid collection posterior to the gastric wall - LUQ fluid collection similar to prior. It was sampled by IR and culture was negative. No gas or rim enhancement to indicate this is now infected. WBC is elevated but trending down. He is post-splenectomy which can cause leukocytosis. Given this is hematoma it did not drain well with the last drain placement, therefore would not recommend draining at this point. Monitor for fever or any signs of infection. If he does show signs of infection then would consider antibiotics and IR consult for drainage, but this is not indicated at this point. - Pancreatic inflammation is new compared to last CT scan, and lipase is more elevated at 182. Lipase trending  down. Management of pancreatitis per primary team with supportive care. Unsure if the smaller fluid collection is a developing pseudocyst. Consider GI consult or follow up. -Ok for discharge from surgical standpoint. I will send rx for pain medication to his pharmacy and arrange follow up with surgeon. He is requesting referral for second opinion, I will have our office work on placing this referral.   ID - none indicated. Patient reports nightly fevers since last hospitalization. Also notes dysuria. Urine and Blood cultures NGTD VTE - SCDs, ok for chemical dvt ppx from surgical standpoint FEN - reg  diet Foley - none  DM HTN HLD Chronic pain  Tobacco abuse ETOH abuse    LOS: 2 days    Samuel Abbott, [facility] 06/03/2023, 10:15 AM Please see Amion for pager number during day hours 7:00am-4:30pm

## 2023-06-07 ENCOUNTER — Inpatient Hospital Stay: Admission: RE | Admit: 2023-06-07 | Payer: 59 | Source: Ambulatory Visit

## 2023-06-07 ENCOUNTER — Other Ambulatory Visit: Payer: 59

## 2023-06-07 LAB — CULTURE, BLOOD (ROUTINE X 2)
Culture: NO GROWTH
Culture: NO GROWTH

## 2023-06-14 ENCOUNTER — Other Ambulatory Visit (HOSPITAL_COMMUNITY): Payer: Self-pay

## 2023-06-15 ENCOUNTER — Other Ambulatory Visit: Payer: Self-pay

## 2023-06-15 ENCOUNTER — Emergency Department (HOSPITAL_COMMUNITY): Payer: 59

## 2023-06-15 ENCOUNTER — Encounter (HOSPITAL_COMMUNITY): Payer: Self-pay | Admitting: Emergency Medicine

## 2023-06-15 ENCOUNTER — Inpatient Hospital Stay (HOSPITAL_COMMUNITY)
Admission: EM | Admit: 2023-06-15 | Discharge: 2023-06-17 | DRG: 439 | Disposition: A | Discharge status: Other Acute Inpatient Hospital | Payer: 59 | Attending: Family Medicine | Admitting: Family Medicine

## 2023-06-15 DIAGNOSIS — F17210 Nicotine dependence, cigarettes, uncomplicated: Secondary | ICD-10-CM | POA: Diagnosis present

## 2023-06-15 DIAGNOSIS — E871 Hypo-osmolality and hyponatremia: Secondary | ICD-10-CM | POA: Diagnosis present

## 2023-06-15 DIAGNOSIS — F411 Generalized anxiety disorder: Secondary | ICD-10-CM | POA: Diagnosis present

## 2023-06-15 DIAGNOSIS — K860 Alcohol-induced chronic pancreatitis: Secondary | ICD-10-CM | POA: Diagnosis present

## 2023-06-15 DIAGNOSIS — Z681 Body mass index (BMI) 19 or less, adult: Secondary | ICD-10-CM

## 2023-06-15 DIAGNOSIS — R630 Anorexia: Secondary | ICD-10-CM | POA: Diagnosis present

## 2023-06-15 DIAGNOSIS — Z886 Allergy status to analgesic agent status: Secondary | ICD-10-CM

## 2023-06-15 DIAGNOSIS — Z79899 Other long term (current) drug therapy: Secondary | ICD-10-CM | POA: Diagnosis not present

## 2023-06-15 DIAGNOSIS — R1012 Left upper quadrant pain: Secondary | ICD-10-CM | POA: Diagnosis not present

## 2023-06-15 DIAGNOSIS — R634 Abnormal weight loss: Secondary | ICD-10-CM | POA: Diagnosis present

## 2023-06-15 DIAGNOSIS — R6881 Early satiety: Secondary | ICD-10-CM | POA: Diagnosis present

## 2023-06-15 DIAGNOSIS — E861 Hypovolemia: Secondary | ICD-10-CM | POA: Diagnosis present

## 2023-06-15 DIAGNOSIS — Z880 Allergy status to penicillin: Secondary | ICD-10-CM

## 2023-06-15 DIAGNOSIS — I10 Essential (primary) hypertension: Secondary | ICD-10-CM | POA: Diagnosis present

## 2023-06-15 DIAGNOSIS — R109 Unspecified abdominal pain: Secondary | ICD-10-CM | POA: Diagnosis present

## 2023-06-15 DIAGNOSIS — Z9081 Acquired absence of spleen: Secondary | ICD-10-CM | POA: Diagnosis not present

## 2023-06-15 DIAGNOSIS — Z7984 Long term (current) use of oral hypoglycemic drugs: Secondary | ICD-10-CM | POA: Diagnosis not present

## 2023-06-15 DIAGNOSIS — E119 Type 2 diabetes mellitus without complications: Secondary | ICD-10-CM | POA: Diagnosis present

## 2023-06-15 DIAGNOSIS — R197 Diarrhea, unspecified: Secondary | ICD-10-CM | POA: Diagnosis present

## 2023-06-15 DIAGNOSIS — G8929 Other chronic pain: Secondary | ICD-10-CM | POA: Diagnosis present

## 2023-06-15 DIAGNOSIS — E7800 Pure hypercholesterolemia, unspecified: Secondary | ICD-10-CM | POA: Diagnosis present

## 2023-06-15 DIAGNOSIS — K863 Pseudocyst of pancreas: Principal | ICD-10-CM | POA: Diagnosis present

## 2023-06-15 DIAGNOSIS — Z823 Family history of stroke: Secondary | ICD-10-CM

## 2023-06-15 LAB — CBC WITH DIFFERENTIAL/PLATELET
Abs Immature Granulocytes: 0.08 10*3/uL — ABNORMAL HIGH (ref 0.00–0.07)
Basophils Absolute: 0.1 10*3/uL (ref 0.0–0.1)
Basophils Relative: 0 %
Eosinophils Absolute: 0 10*3/uL (ref 0.0–0.5)
Eosinophils Relative: 0 %
HCT: 34.4 % — ABNORMAL LOW (ref 39.0–52.0)
Hemoglobin: 10.6 g/dL — ABNORMAL LOW (ref 13.0–17.0)
Immature Granulocytes: 1 %
Lymphocytes Relative: 18 %
Lymphs Abs: 3 10*3/uL (ref 0.7–4.0)
MCH: 25.5 pg — ABNORMAL LOW (ref 26.0–34.0)
MCHC: 30.8 g/dL (ref 30.0–36.0)
MCV: 82.9 fL (ref 80.0–100.0)
Monocytes Absolute: 0.2 10*3/uL (ref 0.1–1.0)
Monocytes Relative: 1 %
Neutro Abs: 13.2 10*3/uL — ABNORMAL HIGH (ref 1.7–7.7)
Neutrophils Relative %: 80 %
Platelets: 816 10*3/uL — ABNORMAL HIGH (ref 150–400)
RBC: 4.15 MIL/uL — ABNORMAL LOW (ref 4.22–5.81)
RDW: 16.3 % — ABNORMAL HIGH (ref 11.5–15.5)
WBC: 16.5 10*3/uL — ABNORMAL HIGH (ref 4.0–10.5)
nRBC: 0 % (ref 0.0–0.2)

## 2023-06-15 LAB — URINALYSIS, ROUTINE W REFLEX MICROSCOPIC
Bilirubin Urine: NEGATIVE
Glucose, UA: NEGATIVE mg/dL
Hgb urine dipstick: NEGATIVE
Ketones, ur: 5 mg/dL — AB
Leukocytes,Ua: NEGATIVE
Nitrite: NEGATIVE
Protein, ur: NEGATIVE mg/dL
Specific Gravity, Urine: 1.028 (ref 1.005–1.030)
pH: 6 (ref 5.0–8.0)

## 2023-06-15 LAB — I-STAT CHEM 8, ED
BUN: 12 mg/dL (ref 6–20)
Calcium, Ion: 1.09 mmol/L — ABNORMAL LOW (ref 1.15–1.40)
Chloride: 103 mmol/L (ref 98–111)
Creatinine, Ser: 0.5 mg/dL — ABNORMAL LOW (ref 0.61–1.24)
Glucose, Bld: 138 mg/dL — ABNORMAL HIGH (ref 70–99)
HCT: 36 % — ABNORMAL LOW (ref 39.0–52.0)
Hemoglobin: 12.2 g/dL — ABNORMAL LOW (ref 13.0–17.0)
Potassium: 4.8 mmol/L (ref 3.5–5.1)
Sodium: 134 mmol/L — ABNORMAL LOW (ref 135–145)
TCO2: 19 mmol/L — ABNORMAL LOW (ref 22–32)

## 2023-06-15 LAB — COMPREHENSIVE METABOLIC PANEL
ALT: 13 U/L (ref 0–44)
AST: 16 U/L (ref 15–41)
Albumin: 3 g/dL — ABNORMAL LOW (ref 3.5–5.0)
Alkaline Phosphatase: 131 U/L — ABNORMAL HIGH (ref 38–126)
Anion gap: 12 (ref 5–15)
BUN: 11 mg/dL (ref 6–20)
CO2: 20 mmol/L — ABNORMAL LOW (ref 22–32)
Calcium: 9.6 mg/dL (ref 8.9–10.3)
Chloride: 102 mmol/L (ref 98–111)
Creatinine, Ser: 0.53 mg/dL — ABNORMAL LOW (ref 0.61–1.24)
GFR, Estimated: >60 mL/min (ref >60)
Glucose, Bld: 139 mg/dL — ABNORMAL HIGH (ref 70–99)
Potassium: 4.7 mmol/L (ref 3.5–5.1)
Sodium: 134 mmol/L — ABNORMAL LOW (ref 135–145)
Total Bilirubin: 0.6 mg/dL (ref <1.2)
Total Protein: 7.7 g/dL (ref 6.5–8.1)

## 2023-06-15 LAB — LIPASE, BLOOD: Lipase: 40 U/L (ref 11–51)

## 2023-06-15 MED ORDER — ONDANSETRON HCL 4 MG/2ML IJ SOLN
4.0000 mg | Freq: Four times a day (QID) | INTRAMUSCULAR | Status: DC | PRN
  Administered 2023-06-15 – 2023-06-17 (×5): 4 mg via INTRAVENOUS
  Filled 2023-06-15 (×5): qty 2

## 2023-06-15 MED ORDER — HYDROMORPHONE HCL 1 MG/ML IJ SOLN
1.0000 mg | INTRAMUSCULAR | Status: AC
  Administered 2023-06-15: 1 mg via INTRAVENOUS
  Filled 2023-06-15: qty 1

## 2023-06-15 MED ORDER — OXYCODONE HCL 5 MG PO TABS
5.0000 mg | ORAL_TABLET | Freq: Four times a day (QID) | ORAL | Status: DC | PRN
  Administered 2023-06-16: 5 mg via ORAL
  Filled 2023-06-15: qty 1

## 2023-06-15 MED ORDER — HYDROMORPHONE HCL 1 MG/ML IJ SOLN
0.5000 mg | Freq: Once | INTRAMUSCULAR | Status: AC
  Administered 2023-06-15: 0.5 mg via INTRAVENOUS
  Filled 2023-06-15: qty 1

## 2023-06-15 MED ORDER — ONDANSETRON HCL 4 MG/2ML IJ SOLN
4.0000 mg | Freq: Once | INTRAMUSCULAR | Status: AC
  Administered 2023-06-15: 4 mg via INTRAVENOUS
  Filled 2023-06-15: qty 2

## 2023-06-15 MED ORDER — NICOTINE 21 MG/24HR TD PT24
21.0000 mg | MEDICATED_PATCH | Freq: Every day | TRANSDERMAL | Status: DC
  Administered 2023-06-15 – 2023-06-17 (×3): 21 mg via TRANSDERMAL
  Filled 2023-06-15 (×3): qty 1

## 2023-06-15 MED ORDER — LIDOCAINE 5 % EX PTCH
1.0000 | MEDICATED_PATCH | Freq: Every day | CUTANEOUS | Status: DC
  Administered 2023-06-15 – 2023-06-16 (×2): 1 via TRANSDERMAL
  Filled 2023-06-15 (×2): qty 1

## 2023-06-15 MED ORDER — SODIUM CHLORIDE 0.9 % IV BOLUS
1000.0000 mL | Freq: Once | INTRAVENOUS | Status: AC
  Administered 2023-06-15: 1000 mL via INTRAVENOUS

## 2023-06-15 MED ORDER — HYDROMORPHONE HCL 1 MG/ML IJ SOLN: 0.5000 mg | INTRAMUSCULAR | Status: DC | PRN

## 2023-06-15 MED ORDER — IOHEXOL 350 MG/ML SOLN
75.0000 mL | Freq: Once | INTRAVENOUS | Status: AC | PRN
  Administered 2023-06-15: 75 mL via INTRAVENOUS

## 2023-06-15 MED ORDER — ACETAMINOPHEN 500 MG PO TABS
500.0000 mg | ORAL_TABLET | Freq: Four times a day (QID) | ORAL | Status: DC | PRN
  Filled 2023-06-15 (×2): qty 1

## 2023-06-15 NOTE — ED Provider Notes (Signed)
[place] EMERGENCY DEPARTMENT AT [facility] Provider Note  CSN: [id number] Arrival date & time: 06/15/23 1412  Chief Complaint(s) Abdominal Pain  HPI Samuel Abbott is a 62 y.o. male history of alcohol abuse, chronic pancreatitis, recent course complicated by splenic hematoma with splenectomy, now with persistent pain presenting with pain.  Reports last week is a decreasing pain, nausea and vomiting.  No fevers or chills.  Reports that the pain is constant, worse with eating.  Reports significant weight loss.  No hematemesis, hematochezia.  No diarrhea.  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   SIRS (systemic inflammatory response syndrome) (HCC) 05/20/2023   Alcohol use 05/20/2023   History of chronic pancreatitis 05/20/2023   Abdominal pain 05/20/2023   Pain syndrome, chronic 05/20/2023   GAD (generalized anxiety disorder) 05/20/2023   Chronic anemia 05/20/2023   Emphysema lung (HCC) 05/20/2023   Hyponatremia 05/20/2023   Splenic hemorrhage 04/20/2023   Pancreatic cyst 04/09/2023   Closed splenic rupture 04/07/2023   Pancreatic pseudocyst 04/07/2023   Leukocytosis 04/07/2023   Essential hypertension 04/07/2023   Type 2 diabetes mellitus (HCC) 04/07/2023   AKI (acute kidney injury) (HCC) 04/07/2023   Malnutrition of moderate degree 01/23/2021   Chronic pancreatitis (HCC) 01/22/2021   Hyperlipidemia 09/10/2018   Acute alcoholic pancreatitis 09/09/2018   Nausea & vomiting 09/09/2018   Tobacco abuse 09/09/2018   Chronic alcohol abuse 09/09/2018   Acute pancreatitis 09/08/2018    Home Medication(s) Prior to Admission medications   Medication Sig Start Date End Date Taking? Authorizing Provider  acetaminophen (TYLENOL) 500 MG tablet Take 2 tablets (1,000 mg total) by mouth every 6 (six) hours as needed for mild pain, moderate pain, fever or headache. 04/23/23   Barnetta Chapel, PA-C  ALPRAZolam Prudy Feeler) 0.25 MG tablet Take 1 tablet by mouth 2 (two) times daily as needed for anxiety.    [provider]  Chlorphen-Pseudoephed-APAP (THERAFLU FLU/COLD PO) Take 1 Dose by mouth 2 (two) times daily as needed (Cold sx).    [provider]  Cholecalciferol (D3 PO) Take 1 tablet by mouth daily.    [provider]  Cyanocobalamin (B-12 PO) Take 1 tablet by mouth daily.    [provider]  DULoxetine (CYMBALTA) 20 MG capsule Take 20 mg by mouth daily. For back pain 01/13/21   [provider]  Ferrous Sulfate (IRON PO) Take 1 tablet by mouth daily.    [provider]  losartan (COZAAR) 25 MG tablet Take 25 mg by mouth daily. 09/16/20   [provider]  metFORMIN (GLUCOPHAGE) 500 MG tablet Take 1 tablet (500 mg total) by mouth daily with breakfast. Patient taking differently: Take 250 mg by mouth 2 (two) times daily. 01/24/21 06/19/23  Maness, Loistine Chance, MD  methocarbamol (ROBAXIN) 500 MG tablet Take 1 tablet (500 mg total) by mouth every 6 (six) hours as needed for muscle spasms. 06/03/23   Meuth, Brooke A, PA-C  Multiple Vitamin (MULTIVITAMIN) tablet Take 1 tablet by mouth daily.    [provider]  ondansetron (ZOFRAN) 4 MG tablet Take 1  tablet (4 mg total) by mouth every 8 (eight) hours as needed for nausea or vomiting. 06/03/23   Meuth, Brooke A, PA-C  oxyCODONE (OXY IR/ROXICODONE) 5 MG immediate release tablet Take 1-2 tablets (5-10 mg total) by mouth every 6 (six) hours as needed for severe pain (pain score 7-10). 06/03/23   Meuth, Brooke A, PA-C  pantoprazole (PROTONIX) 40 MG tablet Take 1 tablet (40 mg total) by  mouth daily. 06/04/23   Rodolph Bong, MD  senna-docusate (SENOKOT-S) 8.6-50 MG tablet Take 2 tablets by mouth at bedtime as needed for mild constipation. 04/23/23   Leroy Sea, MD  tiZANidine (ZANAFLEX) 4 MG tablet Take 4 mg by mouth every 8 (eight) hours as needed for muscle spasms. 05/10/23   [provider]                                                                                                                                    Past Surgical History Past Surgical History:  Procedure Laterality Date   SPLENECTOMY, TOTAL N/A 04/21/2023   Procedure: OPEN SPLENECTOMY;  Surgeon: [name], MD;  Location: [facility] OR;  Service: General;  Laterality: N/A;   WRIST SURGERY     Family History Family History  Problem Relation Age of Onset   CVA Father     Social History Social History   Tobacco Use   Smoking status: Every Day    Current packs/day: 1.50    Average packs/day: 1.5 packs/day for 40.0 years (60.0 ttl pk-yrs)    Types: Cigarettes   Smokeless tobacco: Never  Substance Use Topics   Alcohol use: Not Currently    Comment: States no alcohol in over a week   Drug use: Not Currently   Allergies Amoxil [amoxicillin], Penicillins, and Motrin [ibuprofen]  Review of Systems Review of Systems  All other systems reviewed and are negative.   Physical Exam Vital Signs  I have reviewed the triage vital signs BP 135/84   Pulse 83   Temp 98.3 F (36.8 C)   Resp 18   Ht 6' (1.829 m)   Wt 65.9 kg   SpO2 100%   BMI 19.70 kg/m  Physical Exam Vitals and nursing note reviewed.  Constitutional:      General: He is not in acute distress.    Appearance: Normal appearance.  HENT:     Mouth/Throat:     Mouth: Mucous membranes are moist.  Eyes:     Conjunctiva/sclera: Conjunctivae normal.  Cardiovascular:     Rate and Rhythm: Normal rate and regular rhythm.  Pulmonary:     Effort: Pulmonary effort is normal. No respiratory distress.     Breath  sounds: Normal breath sounds.  Abdominal:     General: Abdomen is flat.     Palpations: Abdomen is soft.     Tenderness: There is abdominal tenderness in the epigastric area and left upper quadrant.  Musculoskeletal:  Right lower leg: No edema.     Left lower leg: No edema.  Skin:    General: Skin is warm and dry.     Capillary Refill: Capillary refill takes less than 2 seconds.  Neurological:     Mental Status: He is alert and oriented to person, place, and time. Mental status is at baseline.  Psychiatric:        Mood and Affect: Mood normal.        Behavior: Behavior normal.     ED Results and Treatments Labs (all labs ordered are listed, but only abnormal results are displayed) Labs Reviewed  COMPREHENSIVE METABOLIC PANEL - Abnormal; Notable for the following components:      Result Value   Sodium 134 (*)    CO2 20 (*)    Glucose, Bld 139 (*)    Creatinine, Ser 0.53 (*)    Albumin 3.0 (*)    Alkaline Phosphatase 131 (*)    All other components within normal limits  CBC WITH DIFFERENTIAL/PLATELET - Abnormal; Notable for the following components:   WBC 16.5 (*)    RBC 4.15 (*)    Hemoglobin 10.6 (*)    HCT 34.4 (*)    MCH 25.5 (*)    RDW 16.3 (*)    Platelets 816 (*)    Neutro Abs 13.2 (*)    Abs Immature Granulocytes 0.08 (*)    All other components within normal limits  URINALYSIS, ROUTINE W REFLEX MICROSCOPIC - Abnormal; Notable for the following components:   APPearance HAZY (*)    Ketones, ur 5 (*)    All other components within normal limits  I-STAT CHEM 8, ED - Abnormal; Notable for the following components:   Sodium 134 (*)    Creatinine, Ser 0.50 (*)    Glucose, Bld 138 (*)    Calcium, Ion 1.09 (*)    TCO2 19 (*)    Hemoglobin 12.2 (*)    HCT 36.0 (*)    All other components within normal limits  LIPASE, BLOOD                                                                                                                          Radiology CT  ABDOMEN PELVIS W CONTRAST  Result Date: 06/15/2023 CLINICAL DATA:  Abdominal pain EXAM: CT ABDOMEN AND PELVIS WITH CONTRAST TECHNIQUE: Multidetector CT imaging of the abdomen and pelvis was performed using the standard protocol following bolus administration of intravenous contrast. RADIATION DOSE REDUCTION: This exam was performed according to the departmental dose-optimization program which includes automated exposure control, adjustment of the mA and/or kV according to patient size and/or use of iterative reconstruction technique. CONTRAST:  75mL OMNIPAQUE IOHEXOL 350 MG/ML SOLN COMPARISON:  CT 05/31/2023. FINDINGS: Lower chest: Slight linear opacity left lung base likely scar or atelectasis. No pleural effusion. Breathing motion at the bases. Hepatobiliary: No focal liver abnormality is seen. No gallstones, gallbladder wall thickening, or biliary dilatation. Patent portal vein.  Pancreas: Multiple punctate calcifications once again seen along the pancreas consistent with chronic calcific pancreatitis. The amount of peripancreatic fat stranding has improved from previous. There is also maturing of previous fluid collections in the left upper quadrant including extending along the lesser sac and greater curve of the stomach. There is also areas adjacent to the spleen. The smaller area along the posterior wall of the stomach previously measuring 4.2 x 1.5 cm, today on series 2, image 21 measures 5.8 x 3.5 cm. Several other well-defined lobular areas anterolateral to this are larger and more well-defined. Is also collection abutting the tail of the pancreas which is increased today measuring 4.4 x 3.8 cm. No associated air in these collections. There is also a very small fluid collection immediately above the pancreatic head and posterior to the portal vein on series 2 image 32 measuring 18 mm. This new. Spleen: Mass effect along the spleen by the large complex fluid collection once again identified. The spleen  itself is nondilated. The collection previously measured 13.5 x 9.8 cm and today when measured in the same fashion 16.3 x 12.4 cm. The wall is more well-defined. Adrenals/Urinary Tract: Adrenal glands are unremarkable. Kidneys are normal, without renal calculi, focal lesion, or hydronephrosis. Bladder is unremarkable. Stomach/Bowel: Mass effect along the stomach from the aforementioned fluid collection in the left upper quadrant. Otherwise the bowel is nondilated on this non oral contrast exam. Scattered stool. Vascular/Lymphatic: Normal caliber aorta and IVC. Mild scattered vascular calcifications. Circumaortic left renal vein. No discrete abnormal lymph node enlargement identified in the abdomen and pelvis. Reproductive: Prostate is unremarkable. Other: Improving mesenteric stranding with some residual tracking along the left side of the abdomen and along the perinephric space. Thickening of the lateral conal fascia on the left. Small amount of fluid as well adjacent to segment 3 of the liver is decreasing. Some residual. Musculoskeletal: Curvature and degenerative changes along the spine. Degenerative changes of the pelvis. Old right-sided rib fractures. Trace retrolisthesis seen of L1 on L2. IMPRESSION: Maturing changes from presumed prior episode of pancreatitis. The peripancreatic inflammatory changes are improving. There is preserved enhancement of the pancreatic parenchyma today. However several fluid collections identified have increased in size and are more confluence with a more well-defined wall throughout the left upper quadrant. Is also a new small collection adjacent to the pancreatic head. These are consistent with pseudocysts. The larger areas in the left upper quadrant do have significant mass effect along the posterior wall of the stomach and mass effect along the spleen Electronically Signed   By: Karen Kays M.D.   On: 06/15/2023 16:13    Pertinent labs & imaging results that were available  during my care of the patient were reviewed by me and considered in my medical decision making (see MDM for details).  Medications Ordered in ED Medications  ondansetron (ZOFRAN) injection 4 mg (4 mg Intravenous Given 06/15/23 1515)  HYDROmorphone (DILAUDID) injection 0.5 mg (has no administration in time range)  iohexol (OMNIPAQUE) 350 MG/ML injection 75 mL (75 mLs Intravenous Contrast Given 06/15/23 1600)  sodium chloride 0.9 % bolus 1,000 mL (1,000 mLs Intravenous New Bag/Given 06/15/23 2057)  HYDROmorphone (DILAUDID) injection 0.5 mg (0.5 mg Intravenous Given 06/15/23 2052)  ondansetron (ZOFRAN) injection 4 mg (4 mg Intravenous Given 06/15/23 2055)  Procedures Procedures  (including critical care time)  Medical Decision Making / ED Course   MDM:  62 year old presenting to the emergency department with abdominal pain postoperatively from splenectomy.  Patient with diffuse abdominal tenderness.  CT scan performed given complicated history.  CT scan demonstrates multiple fluid collections, some of which have enlarged and are causing mass effect on the stomach.  He reports that he is having some subjective fevers at home.  He does have a leukocytosis but this is appears similar to previous leukocytosis on prior lab draws.  He is otherwise hemodynamically stable.  No evidence of other process such as appendicitis or obstruction on imaging.  His labs are reassuring with no AKI or signs of severe dehydration.  Discussed with on-call surgeon Dr. Hillery Hunter, he will consult, try to determine whether would be beneficial to perform any drainage.  Also discussed with Dr. Margo Aye, hospitalist to admit the patient.      Additional history obtained: -Additional history obtained from family -External records from outside source obtained and reviewed including: Chart review  including previous notes, labs, imaging, consultation notes including prior ER visits    Lab Tests: -I ordered, reviewed, and interpreted labs.   The pertinent results include:   Labs Reviewed  COMPREHENSIVE METABOLIC PANEL - Abnormal; Notable for the following components:      Result Value   Sodium 134 (*)    CO2 20 (*)    Glucose, Bld 139 (*)    Creatinine, Ser 0.53 (*)    Albumin 3.0 (*)    Alkaline Phosphatase 131 (*)    All other components within normal limits  CBC WITH DIFFERENTIAL/PLATELET - Abnormal; Notable for the following components:   WBC 16.5 (*)    RBC 4.15 (*)    Hemoglobin 10.6 (*)    HCT 34.4 (*)    MCH 25.5 (*)    RDW 16.3 (*)    Platelets 816 (*)    Neutro Abs 13.2 (*)    Abs Immature Granulocytes 0.08 (*)    All other components within normal limits  URINALYSIS, ROUTINE W REFLEX MICROSCOPIC - Abnormal; Notable for the following components:   APPearance HAZY (*)    Ketones, ur 5 (*)    All other components within normal limits  I-STAT CHEM 8, ED - Abnormal; Notable for the following components:   Sodium 134 (*)    Creatinine, Ser 0.50 (*)    Glucose, Bld 138 (*)    Calcium, Ion 1.09 (*)    TCO2 19 (*)    Hemoglobin 12.2 (*)    HCT 36.0 (*)    All other components within normal limits  LIPASE, BLOOD    Notable for leukocytosis, nml lipase    Imaging Studies ordered: I ordered imaging studies including CT abdomen  On my interpretation imaging demonstrates fluid collections  I independently visualized and interpreted imaging. I agree with the radiologist interpretation   Medicines ordered and prescription drug management: Meds ordered this encounter  Medications   ondansetron (ZOFRAN) injection 4 mg   iohexol (OMNIPAQUE) 350 MG/ML injection 75 mL   sodium chloride 0.9 % bolus 1,000 mL   HYDROmorphone (DILAUDID) injection 0.5 mg   ondansetron (ZOFRAN) injection 4 mg   HYDROmorphone (DILAUDID) injection 0.5 mg    -I have reviewed the  patients home medicines and have made adjustments as needed   Consultations Obtained: I requested consultation with the general surgeon,  and discussed lab and imaging findings as well as pertinent plan -  they recommend: they will consult    Cardiac Monitoring: The patient was maintained on a cardiac monitor.  I personally viewed and interpreted the cardiac monitored which showed an underlying rhythm of: NSR  Social Determinants of Health:  Diagnosis or treatment significantly limited by social determinants of health: alcohol use   Reevaluation: After the interventions noted above, I reevaluated the patient and found that their symptoms have improved  Co morbidities that complicate the patient evaluation  Past Medical History:  Diagnosis Date   Acute pancreatitis    Alcohol abuse    Diabetes mellitus without complication (HCC)    Eczema    High cholesterol    Hypertension    Tobacco abuse       Dispostion: Disposition decision including need for hospitalization was considered, and patient admitted to the hospital.    Final Clinical Impression(s) / ED Diagnoses Final diagnoses:  Abdominal pain, unspecified abdominal location     This chart was dictated using voice recognition software.  Despite best efforts to proofread,  errors can occur which can change the documentation meaning.    Lonell Grandchild, MD 06/15/23 2141

## 2023-06-15 NOTE — ED Provider Triage Note (Signed)
Emergency Medicine Provider Triage Evaluation Note  Samuel Abbott , a 62 y.o. male  was evaluated in triage.  Pt complains of N/V/D and left abdominal pain.  Review of Systems  Positive:  Negative:   Physical Exam  BP 125/71 (BP Location: Right Arm)   Pulse 96   Temp 97.6 F (36.4 C) (Oral)   Resp (!) 24   Ht 6' (1.829 m)   Wt 65.9 kg   SpO2 100%   BMI 19.70 kg/m  Gen:   Awake, no distress   Resp:  Normal effort  MSK:   Moves extremities without difficulty  Other:    Medical Decision Making  Medically screening exam initiated at 2:59 PM.  Appropriate orders placed.  Samuel Abbott was informed that the remainder of the evaluation will be completed by another provider, this initial triage assessment does not replace that evaluation, and the importance of remaining in the ED until their evaluation is complete.  Nausea, vomiting, diarrhea, left sided abdominal pain x1 week. 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 Friend at bedside asking for PET scan. Stating that they were recently admitted without any reason that is causing these symptoms. Pain is now worse than when he was discharged.    Samuel Abbott, [place] 06/15/23 1504

## 2023-06-15 NOTE — ED Triage Notes (Signed)
Pt here for L side pain, LUQ abd pain, states it radiates up to his neck. States he had his spleen removed 10/23. Endorses N/V/D x 1 week. States he feels his pancreas is inflamed and states there is a cyst present on his pancreas.

## 2023-06-15 NOTE — H&P (Signed)
History and Physical  Samuel Abbott ZOX:[id number] DOB: March 01, 1961 DOA: 06/15/2023  Referring physician: Dr. Suezanne Jacquet, EDP  PCP: Norm Salt, [place]  Outpatient Specialists: GI, general surgery. Patient coming from: Home.  Chief Complaint: Abdominal pain  HPI: Samuel Abbott is a 62 y.o. 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  Associated with lack of appetite, unintentional weight loss.  In the ED, lab studies notable for leukocytosis.  CT abdomen and pelvis with contrast revealed the following:  Several fluid collections identified have increased in size and are more confluence with a more well-defined wall throughout the left upper quadrant. Is also a new small collection adjacent to the pancreatic head. These are consistent with pseudocysts. The larger areas in the left upper quadrant do have significant mass effect along the posterior wall of the stomach and mass effect along the Spleen.  The patient received multiple rounds of IV opiate based analgesics with minimal improvement.  EDP discussed the case with general surgery who will see in consultation in the morning.  Admitted by [facility], hospitalist service.  ED Course: Temperature 98.1.  BP 115/86, pulse 74, respiratory rate 19, O2 saturation 100% on room air.  Review of Systems: Review of systems as noted in the HPI. All other systems reviewed and are negative.   Past Medical History:  Diagnosis Date   Acute pancreatitis    Alcohol abuse    Diabetes mellitus without complication (HCC)    Eczema    High cholesterol    Hypertension    Tobacco abuse    Past Surgical History:  Procedure Laterality Date   SPLENECTOMY, TOTAL N/A 04/21/2023   Procedure:  OPEN SPLENECTOMY;  Surgeon: [name], MD;  Location: [facility] OR;  Service: General;  Laterality: N/A;   WRIST SURGERY      Social History:  reports that he has been smoking cigarettes. He has a 60 pack-year smoking history. He has never used smokeless tobacco. He reports that he does not currently use alcohol. He reports that he does not currently use drugs.   Allergies  Allergen Reactions   Amoxil [Amoxicillin] Anaphylaxis   Penicillins Anaphylaxis   Motrin [Ibuprofen] Other (See Comments)    Stomach pain after several uses of 800mg  tablets.    Family History  Problem Relation Age of Onset   CVA Father       Prior to Admission medications   Medication Sig Start Date End Date Taking? Authorizing Provider  acetaminophen (TYLENOL) 500 MG tablet Take 2 tablets (1,000 mg total) by mouth every 6 (six) hours as needed for mild pain, moderate pain, fever or headache. 04/23/23   Barnetta Chapel, PA-C  ALPRAZolam Prudy Feeler) 0.25 MG tablet Take 1 tablet by mouth 2 (two) times daily as needed for anxiety.    [provider]  Chlorphen-Pseudoephed-APAP (THERAFLU FLU/COLD PO) Take 1 Dose by mouth 2 (two) times daily as needed (Cold sx).    [provider]  Cholecalciferol (D3 PO) Take 1 tablet by mouth daily.    [provider]  Cyanocobalamin (B-12 PO) Take 1 tablet by mouth daily.    [provider]  DULoxetine (CYMBALTA) 20 MG capsule Take 20 mg by mouth daily. For back pain 01/13/21   [provider]  Ferrous Sulfate (IRON PO) Take 1 tablet by  mouth daily.    [provider]  losartan (COZAAR) 25 MG tablet Take 25 mg by mouth daily. 09/16/20   [provider]  metFORMIN (GLUCOPHAGE) 500 MG tablet Take 1 tablet (500 mg total) by mouth daily with breakfast. Patient taking differently: Take 250 mg by mouth 2 (two) times daily. 01/24/21 06/19/23  Maness, Loistine Chance, MD  methocarbamol (ROBAXIN) 500 MG tablet Take 1 tablet (500 mg total) by mouth  every 6 (six) hours as needed for muscle spasms. 06/03/23   Meuth, Brooke A, PA-C  Multiple Vitamin (MULTIVITAMIN) tablet Take 1 tablet by mouth daily.    [provider]  ondansetron (ZOFRAN) 4 MG tablet Take 1 tablet (4 mg total) by mouth every 8 (eight) hours as needed for nausea or vomiting. 06/03/23   Meuth, Brooke A, PA-C  oxyCODONE (OXY IR/ROXICODONE) 5 MG immediate release tablet Take 1-2 tablets (5-10 mg total) by mouth every 6 (six) hours as needed for severe pain (pain score 7-10). 06/03/23   Meuth, Brooke A, PA-C  pantoprazole (PROTONIX) 40 MG tablet Take 1 tablet (40 mg total) by mouth daily. 06/04/23   Rodolph Bong, MD  senna-docusate (SENOKOT-S) 8.6-50 MG tablet Take 2 tablets by mouth at bedtime as needed for mild constipation. 04/23/23   Leroy Sea, MD  tiZANidine (ZANAFLEX) 4 MG tablet Take 4 mg by mouth every 8 (eight) hours as needed for muscle spasms. 05/10/23   [provider]    Physical Exam: BP 135/84   Pulse 83   Temp 98.3 F (36.8 C)   Resp 18   Ht 6' (1.829 m)   Wt 65.9 kg   SpO2 100%   BMI 19.70 kg/m   General: 62 y.o. year-old male well developed well nourished in no acute distress.  Alert and oriented x3. Cardiovascular: Regular rate and rhythm with no rubs or gallops.  No thyromegaly or JVD noted.  No lower extremity edema. 2/4 pulses in all 4 extremities. Respiratory: Clear to auscultation with no wheezes or rales. Good inspiratory effort. Abdomen: Soft sided tenderness nondistended with normal bowel sounds x4 quadrants. Muskuloskeletal: No cyanosis, clubbing or edema noted bilaterally Neuro: CN II-XII intact, strength, sensation, reflexes Skin: No ulcerative lesions noted or rashes Psychiatry: Judgement and insight appear normal. Mood is appropriate for condition and setting          Labs on Admission:  Basic Metabolic Panel: Recent Labs  Lab 06/15/23 1518  NA 134*  134*  K 4.7  4.8  CL 102  103  CO2 20*   GLUCOSE 139*  138*  BUN 11  12  CREATININE 0.53*  0.50*  CALCIUM 9.6   Liver Function Tests: Recent Labs  Lab 06/15/23 1518  AST 16  ALT 13  ALKPHOS 131*  BILITOT 0.6  PROT 7.7  ALBUMIN 3.0*   Recent Labs  Lab 06/15/23 1518  LIPASE 40   No results for input(s): "AMMONIA" in the last 168 hours. CBC: Recent Labs  Lab 06/15/23 1518  WBC 16.5*  NEUTROABS 13.2*  HGB 10.6*  12.2*  HCT 34.4*  36.0*  MCV 82.9  PLT 816*   Cardiac Enzymes: No results for input(s): "CKTOTAL", "CKMB", "CKMBINDEX", "TROPONINI" in the last 168 hours.  BNP (last 3 results) Recent Labs    04/22/23 0612 04/23/23 0432  BNP 38.2 46.1    ProBNP (last 3 results) No results for input(s): "PROBNP" in the last 8760 hours.  CBG: No results for input(s): "GLUCAP" in the last 168 hours.  Radiological Exams on Admission: CT ABDOMEN PELVIS W CONTRAST  Result Date: 06/15/2023 CLINICAL DATA:  Abdominal pain EXAM: CT ABDOMEN AND PELVIS WITH CONTRAST TECHNIQUE: Multidetector CT imaging of the abdomen and pelvis was performed using the standard protocol following bolus administration of intravenous contrast. RADIATION DOSE REDUCTION: This exam was performed according to the departmental dose-optimization program which includes automated exposure control, adjustment of the mA and/or kV according to patient size and/or use of iterative reconstruction technique. CONTRAST:  75mL OMNIPAQUE IOHEXOL 350 MG/ML SOLN COMPARISON:  CT 05/31/2023. FINDINGS: Lower chest: Slight linear opacity left lung base likely scar or atelectasis. No pleural effusion. Breathing motion at the bases. Hepatobiliary: No focal liver abnormality is seen. No gallstones, gallbladder wall thickening, or biliary dilatation. Patent portal vein. Pancreas: Multiple punctate calcifications once again seen along the pancreas consistent with chronic calcific pancreatitis. The amount of peripancreatic fat stranding has improved from previous. There  is also maturing of previous fluid collections in the left upper quadrant including extending along the lesser sac and greater curve of the stomach. There is also areas adjacent to the spleen. The smaller area along the posterior wall of the stomach previously measuring 4.2 x 1.5 cm, today on series 2, image 21 measures 5.8 x 3.5 cm. Several other well-defined lobular areas anterolateral to this are larger and more well-defined. Is also collection abutting the tail of the pancreas which is increased today measuring 4.4 x 3.8 cm. No associated air in these collections. There is also a very small fluid collection immediately above the pancreatic head and posterior to the portal vein on series 2 image 32 measuring 18 mm. This new. Spleen: Mass effect along the spleen by the large complex fluid collection once again identified. The spleen itself is nondilated. The collection previously measured 13.5 x 9.8 cm and today when measured in the same fashion 16.3 x 12.4 cm. The wall is more well-defined. Adrenals/Urinary Tract: Adrenal glands are unremarkable. Kidneys are normal, without renal calculi, focal lesion, or hydronephrosis. Bladder is unremarkable. Stomach/Bowel: Mass effect along the stomach from the aforementioned fluid collection in the left upper quadrant. Otherwise the bowel is nondilated on this non oral contrast exam. Scattered stool. Vascular/Lymphatic: Normal caliber aorta and IVC. Mild scattered vascular calcifications. Circumaortic left renal vein. No discrete abnormal lymph node enlargement identified in the abdomen and pelvis. Reproductive: Prostate is unremarkable. Other: Improving mesenteric stranding with some residual tracking along the left side of the abdomen and along the perinephric space. Thickening of the lateral conal fascia on the left. Small amount of fluid as well adjacent to segment 3 of the liver is decreasing. Some residual. Musculoskeletal: Curvature and degenerative changes along the  spine. Degenerative changes of the pelvis. Old right-sided rib fractures. Trace retrolisthesis seen of L1 on L2. IMPRESSION: Maturing changes from presumed prior episode of pancreatitis. The peripancreatic inflammatory changes are improving. There is preserved enhancement of the pancreatic parenchyma today. However several fluid collections identified have increased in size and are more confluence with a more well-defined wall throughout the left upper quadrant. Is also a new small collection adjacent to the pancreatic head. These are consistent with pseudocysts. The larger areas in the left upper quadrant do have significant mass effect along the posterior wall of the stomach and mass effect along the spleen Electronically Signed   By: Karen Kays M.D.   On: 06/15/2023 16:13    EKG: I independently viewed the EKG done and my findings are as followed: Sinus rhythm rate of  88.  Nonspecific ST-T changes.  QTc 467.  RBBB.  Assessment/Plan Present on Admission:  Abdominal pain  Active Problems:   Abdominal pain  Abdominal pain, secondary to:  Several fluid collections identified have increased in size and are more confluence with a more well-defined wall throughout the left upper quadrant. Is also a new small collection adjacent to the pancreatic head. These are consistent with pseudocysts. General surgery will see in consultation Continue pain control Continue as needed IV antiemetics Gentle IV fluid hydration Fall precautions  Mild hypovolemic hyponatremia Serum sodium 134 Repleted with IV NS at 50 cc/h x 1 day.  Chronic back pain Resume home regimen  Unintentional weight loss in the setting of chronic illness BMI 19 Albumin 3.0 Treat underlying conditions Dietary consultation.  Ongoing tobacco use user Nicotine patch   Time: 75 minutes.   DVT prophylaxis: SCDs  Code Status: Full code  Family Communication: None at bedside  Disposition Plan: Admitted to telemetry  surgical unit  Consults called: General Surgery consulted by EDP.  Admission status: Inpatient status.   Status is: Inpatient The patient requires at least 2 midnights for further evaluation and treatment of present condition.   Darlin Drop MD [facility] Pager [phone]  If 7PM-7AM, please contact night-coverage [email or web address] Password [facility]  06/15/2023, 10:22 PM

## 2023-06-15 NOTE — ED Notes (Signed)
ED TO INPATIENT HANDOFF REPORT  ED Nurse Name and Phone #:  Jenness Corner [phone]  S Name/Age/Gender Samuel Abbott 62 y.o. male Room/Bed: 008C/008C  Code Status   Code Status: Prior  Home/SNF/Other Home Patient oriented to: self, place, time, and situation Is this baseline? Yes   Triage Complete: Triage complete  Chief Complaint Abdominal pain [R10.9]  Triage Note Pt here for L side pain, LUQ abd pain, states it radiates up to his neck. States he had his spleen removed 10/23. Endorses N/V/D x 1 week. States he feels his pancreas is inflamed and states there is a cyst present on his pancreas.    Allergies Allergies  Allergen Reactions   Amoxil [Amoxicillin] Anaphylaxis   Penicillins Anaphylaxis   Motrin [Ibuprofen] Other (See Comments)    Stomach pain after several uses of 800mg  tablets.    Level of Care/Admitting Diagnosis ED Disposition     ED Disposition  Admit   Condition  --   Comment  Hospital [facility]: [facility] [[id number]]  Level of Care: Telemetry Surgical [[id number]]  May admit patient to [facility] or [facility] if equivalent level of care is available:: Yes  Covid Evaluation: Asymptomatic - no recent exposure (last 10 days) testing not required  Diagnosis: Abdominal pain [744753]  Admitting Physician: Darlin Drop [[id number]]  Attending Physician: Darlin Drop [[id number]]  Certification:: I certify this patient will need inpatient services for at least 2 midnights  Expected Medical Readiness: 06/17/2023          B Medical/Surgery History Past Medical History:  Diagnosis Date   Acute pancreatitis    Alcohol abuse    Diabetes mellitus without complication (HCC)    Eczema    High cholesterol    Hypertension    Tobacco abuse    Past Surgical History:  Procedure Laterality Date   SPLENECTOMY, TOTAL N/A 04/21/2023   Procedure: OPEN SPLENECTOMY;  Surgeon: [name], MD;  Location: [facility] OR;  Service: General;  Laterality: N/A;    WRIST SURGERY       A IV Location/Drains/Wounds Patient Lines/Drains/Airways Status     Active Line/Drains/Airways     Name Placement date Placement time Site Days   Peripheral IV 06/15/23 20 G Anterior;Right Forearm 06/15/23  1514  Forearm  less than 1            Intake/Output Last 24 hours No intake or output data in the 24 hours ending 06/15/23 2218  Labs/Imaging Results for orders placed or performed during the hospital encounter of 06/15/23 (from the past 48 hour(s))  Urinalysis, Routine w reflex microscopic -Urine, Clean Catch     Status: Abnormal   Collection Time: 06/15/23  3:05 PM  Result Value Ref Range   Color, Urine YELLOW YELLOW   APPearance HAZY (A) CLEAR   Specific Gravity, Urine 1.028 1.005 - 1.030   pH 6.0 5.0 - 8.0   Glucose, UA NEGATIVE NEGATIVE mg/dL   Hgb urine dipstick NEGATIVE NEGATIVE   Bilirubin Urine NEGATIVE NEGATIVE   Ketones, ur 5 (A) NEGATIVE mg/dL   Protein, ur NEGATIVE NEGATIVE mg/dL   Nitrite NEGATIVE NEGATIVE   Leukocytes,Ua NEGATIVE NEGATIVE    Comment: Performed at [facility], [address], [place], [place] [address]  Comprehensive metabolic panel     Status: Abnormal   Collection Time: 06/15/23  3:18 PM  Result Value Ref Range   Sodium 134 (L) 135 - 145 mmol/L   Potassium 4.7 3.5 - 5.1  mmol/L   Chloride 102 98 - 111 mmol/L   CO2 20 (L) 22 - 32 mmol/L   Glucose, Bld 139 (H) 70 - 99 mg/dL    Comment: Glucose reference range applies only to samples taken after fasting for at least 8 hours.   BUN 11 6 - 20 mg/dL   Creatinine, Ser 5.95 (L) 0.61 - 1.24 mg/dL   Calcium 9.6 8.9 - 63.8 mg/dL   Total Protein 7.7 6.5 - 8.1 g/dL   Albumin 3.0 (L) 3.5 - 5.0 g/dL   AST 16 15 - 41 U/L   ALT 13 0 - 44 U/L   Alkaline Phosphatase 131 (H) 38 - 126 U/L   Total Bilirubin 0.6 <1.2 mg/dL   GFR, Estimated >75 >64 mL/min    Comment: (NOTE) Calculated using the CKD-EPI Creatinine Equation (2021)    Anion gap 12 5 - 15    Comment:  Performed at [facility], [address], [place], [place] [address]  Lipase, blood     Status: None   Collection Time: 06/15/23  3:18 PM  Result Value Ref Range   Lipase 40 11 - 51 U/L    Comment: Performed at [facility], [address] [place], [place] [address]  CBC with Diff     Status: Abnormal   Collection Time: 06/15/23  3:18 PM  Result Value Ref Range   WBC 16.5 (H) 4.0 - 10.5 K/uL   RBC 4.15 (L) 4.22 - 5.81 MIL/uL   Hemoglobin 10.6 (L) 13.0 - 17.0 g/dL   HCT 66.0 (L) 63.0 - 16.0 %   MCV 82.9 80.0 - 100.0 fL   MCH 25.5 (L) 26.0 - 34.0 pg   MCHC 30.8 30.0 - 36.0 g/dL   RDW 10.9 (H) 32.3 - 55.7 %   Platelets 816 (H) 150 - 400 K/uL   nRBC 0.0 0.0 - 0.2 %   Neutrophils Relative % 80 %   Neutro Abs 13.2 (H) 1.7 - 7.7 K/uL   Lymphocytes Relative 18 %   Lymphs Abs 3.0 0.7 - 4.0 K/uL   Monocytes Relative 1 %   Monocytes Absolute 0.2 0.1 - 1.0 K/uL   Eosinophils Relative 0 %   Eosinophils Absolute 0.0 0.0 - 0.5 K/uL   Basophils Relative 0 %   Basophils Absolute 0.1 0.0 - 0.1 K/uL   Immature Granulocytes 1 %   Abs Immature Granulocytes 0.08 (H) 0.00 - 0.07 K/uL    Comment: Performed at [facility], [address], [place], [place] [address]  I-stat chem 8, ED (not at [facility], [facility] or [facility])     Status: Abnormal   Collection Time: 06/15/23  3:18 PM  Result Value Ref Range   Sodium 134 (L) 135 - 145 mmol/L   Potassium 4.8 3.5 - 5.1 mmol/L   Chloride 103 98 - 111 mmol/L   BUN 12 6 - 20 mg/dL   Creatinine, Ser 5.42 (L) 0.61 - 1.24 mg/dL   Glucose, Bld 706 (H) 70 - 99 mg/dL    Comment: Glucose reference range applies only to samples taken after fasting for at least 8 hours.   Calcium, Ion 1.09 (L) 1.15 - 1.40 mmol/L   TCO2 19 (L) 22 - 32 mmol/L   Hemoglobin 12.2 (L) 13.0 - 17.0 g/dL   HCT 23.7 (L) 62.8 - 31.5 %   CT ABDOMEN PELVIS W CONTRAST  Result Date: 06/15/2023 CLINICAL DATA:  Abdominal pain EXAM: CT ABDOMEN AND PELVIS WITH CONTRAST  TECHNIQUE:  Multidetector CT imaging of the abdomen and pelvis was performed using the standard protocol following bolus administration of intravenous contrast. RADIATION DOSE REDUCTION: This exam was performed according to the departmental dose-optimization program which includes automated exposure control, adjustment of the mA and/or kV according to patient size and/or use of iterative reconstruction technique. CONTRAST:  75mL OMNIPAQUE IOHEXOL 350 MG/ML SOLN COMPARISON:  CT 05/31/2023. FINDINGS: Lower chest: Slight linear opacity left lung base likely scar or atelectasis. No pleural effusion. Breathing motion at the bases. Hepatobiliary: No focal liver abnormality is seen. No gallstones, gallbladder wall thickening, or biliary dilatation. Patent portal vein. Pancreas: Multiple punctate calcifications once again seen along the pancreas consistent with chronic calcific pancreatitis. The amount of peripancreatic fat stranding has improved from previous. There is also maturing of previous fluid collections in the left upper quadrant including extending along the lesser sac and greater curve of the stomach. There is also areas adjacent to the spleen. The smaller area along the posterior wall of the stomach previously measuring 4.2 x 1.5 cm, today on series 2, image 21 measures 5.8 x 3.5 cm. Several other well-defined lobular areas anterolateral to this are larger and more well-defined. Is also collection abutting the tail of the pancreas which is increased today measuring 4.4 x 3.8 cm. No associated air in these collections. There is also a very small fluid collection immediately above the pancreatic head and posterior to the portal vein on series 2 image 32 measuring 18 mm. This new. Spleen: Mass effect along the spleen by the large complex fluid collection once again identified. The spleen itself is nondilated. The collection previously measured 13.5 x 9.8 cm and today when measured in the same fashion 16.3 x 12.4 cm. The wall  is more well-defined. Adrenals/Urinary Tract: Adrenal glands are unremarkable. Kidneys are normal, without renal calculi, focal lesion, or hydronephrosis. Bladder is unremarkable. Stomach/Bowel: Mass effect along the stomach from the aforementioned fluid collection in the left upper quadrant. Otherwise the bowel is nondilated on this non oral contrast exam. Scattered stool. Vascular/Lymphatic: Normal caliber aorta and IVC. Mild scattered vascular calcifications. Circumaortic left renal vein. No discrete abnormal lymph node enlargement identified in the abdomen and pelvis. Reproductive: Prostate is unremarkable. Other: Improving mesenteric stranding with some residual tracking along the left side of the abdomen and along the perinephric space. Thickening of the lateral conal fascia on the left. Small amount of fluid as well adjacent to segment 3 of the liver is decreasing. Some residual. Musculoskeletal: Curvature and degenerative changes along the spine. Degenerative changes of the pelvis. Old right-sided rib fractures. Trace retrolisthesis seen of L1 on L2. IMPRESSION: Maturing changes from presumed prior episode of pancreatitis. The peripancreatic inflammatory changes are improving. There is preserved enhancement of the pancreatic parenchyma today. However several fluid collections identified have increased in size and are more confluence with a more well-defined wall throughout the left upper quadrant. Is also a new small collection adjacent to the pancreatic head. These are consistent with pseudocysts. The larger areas in the left upper quadrant do have significant mass effect along the posterior wall of the stomach and mass effect along the spleen Electronically Signed   By: Karen Kays M.D.   On: 06/15/2023 16:13    Pending Labs Unresulted Labs (From admission, onward)    None       Vitals/Pain Today's Vitals   06/15/23 2016 06/15/23 2017 06/15/23 2052 06/15/23 2100  BP:  122/87  135/84  Pulse:   93  83  Resp:  18    Temp:      TempSrc:      SpO2:  100%  100%  Weight:      Height:      PainSc: 9   9      Isolation Precautions No active isolations  Medications Medications  ondansetron (ZOFRAN) injection 4 mg (4 mg Intravenous Given 06/15/23 1515)  iohexol (OMNIPAQUE) 350 MG/ML injection 75 mL (75 mLs Intravenous Contrast Given 06/15/23 1600)  sodium chloride 0.9 % bolus 1,000 mL (1,000 mLs Intravenous New Bag/Given 06/15/23 2057)  HYDROmorphone (DILAUDID) injection 0.5 mg (0.5 mg Intravenous Given 06/15/23 2052)  ondansetron (ZOFRAN) injection 4 mg (4 mg Intravenous Given 06/15/23 2055)  HYDROmorphone (DILAUDID) injection 0.5 mg (0.5 mg Intravenous Given 06/15/23 2149)    Mobility walks     Focused Assessments    R Recommendations: See Admitting Provider Note  Report given to:   Additional Notes:  Pt alert and oriented, ambulatory.

## 2023-06-16 DIAGNOSIS — R1012 Left upper quadrant pain: Secondary | ICD-10-CM | POA: Diagnosis not present

## 2023-06-16 LAB — BASIC METABOLIC PANEL
Anion gap: 7 (ref 5–15)
BUN: 9 mg/dL (ref 6–20)
CO2: 23 mmol/L (ref 22–32)
Calcium: 8.8 mg/dL — ABNORMAL LOW (ref 8.9–10.3)
Chloride: 102 mmol/L (ref 98–111)
Creatinine, Ser: 0.55 mg/dL — ABNORMAL LOW (ref 0.61–1.24)
GFR, Estimated: >60 mL/min (ref >60)
Glucose, Bld: 110 mg/dL — ABNORMAL HIGH (ref 70–99)
Potassium: 3.9 mmol/L (ref 3.5–5.1)
Sodium: 132 mmol/L — ABNORMAL LOW (ref 135–145)

## 2023-06-16 LAB — CBC
HCT: 28.8 % — ABNORMAL LOW (ref 39.0–52.0)
Hemoglobin: 8.9 g/dL — ABNORMAL LOW (ref 13.0–17.0)
MCH: 25.5 pg — ABNORMAL LOW (ref 26.0–34.0)
MCHC: 30.9 g/dL (ref 30.0–36.0)
MCV: 82.5 fL (ref 80.0–100.0)
Platelets: 720 10*3/uL — ABNORMAL HIGH (ref 150–400)
RBC: 3.49 MIL/uL — ABNORMAL LOW (ref 4.22–5.81)
RDW: 16 % — ABNORMAL HIGH (ref 11.5–15.5)
WBC: 13.1 10*3/uL — ABNORMAL HIGH (ref 4.0–10.5)
nRBC: 0 % (ref 0.0–0.2)

## 2023-06-16 LAB — PHOSPHORUS: Phosphorus: 3.4 mg/dL (ref 2.5–4.6)

## 2023-06-16 LAB — MAGNESIUM: Magnesium: 1.9 mg/dL (ref 1.7–2.4)

## 2023-06-16 LAB — GLUCOSE, CAPILLARY: Glucose-Capillary: 107 mg/dL — ABNORMAL HIGH (ref 70–99)

## 2023-06-16 MED ORDER — HYDROMORPHONE HCL 1 MG/ML IJ SOLN
1.0000 mg | INTRAMUSCULAR | Status: DC | PRN
  Administered 2023-06-16 – 2023-06-17 (×11): 1 mg via INTRAVENOUS
  Filled 2023-06-16 (×12): qty 1

## 2023-06-16 MED ORDER — HYDROMORPHONE HCL 1 MG/ML IJ SOLN
1.0000 mg | Freq: Once | INTRAMUSCULAR | Status: AC
  Administered 2023-06-16: 1 mg via INTRAVENOUS
  Filled 2023-06-16: qty 1

## 2023-06-16 MED ORDER — LEVOFLOXACIN IN D5W 750 MG/150ML IV SOLN
750.0000 mg | Freq: Once | INTRAVENOUS | Status: AC
  Administered 2023-06-16: 750 mg via INTRAVENOUS
  Filled 2023-06-16: qty 150

## 2023-06-16 MED ORDER — HYDROMORPHONE HCL 1 MG/ML IJ SOLN
0.5000 mg | INTRAMUSCULAR | Status: AC
  Administered 2023-06-16: 0.5 mg via INTRAVENOUS
  Filled 2023-06-16: qty 0.5

## 2023-06-16 MED ORDER — PANTOPRAZOLE SODIUM 40 MG PO TBEC
40.0000 mg | DELAYED_RELEASE_TABLET | Freq: Every day | ORAL | Status: DC
  Administered 2023-06-16 – 2023-06-17 (×2): 40 mg via ORAL
  Filled 2023-06-16 (×2): qty 1

## 2023-06-16 MED ORDER — METRONIDAZOLE 500 MG/100ML IV SOLN
500.0000 mg | Freq: Two times a day (BID) | INTRAVENOUS | Status: DC
  Administered 2023-06-16 – 2023-06-17 (×4): 500 mg via INTRAVENOUS
  Filled 2023-06-16 (×4): qty 100

## 2023-06-16 MED ORDER — LEVOFLOXACIN IN D5W 750 MG/150ML IV SOLN
750.0000 mg | INTRAVENOUS | Status: DC
  Administered 2023-06-16: 750 mg via INTRAVENOUS
  Filled 2023-06-16 (×2): qty 150

## 2023-06-16 MED ORDER — VITAMIN B-12 1000 MCG PO TABS
1000.0000 ug | ORAL_TABLET | Freq: Every day | ORAL | Status: DC
  Administered 2023-06-16 – 2023-06-17 (×2): 1000 ug via ORAL
  Filled 2023-06-16 (×2): qty 1

## 2023-06-16 MED ORDER — OXYCODONE HCL 5 MG PO TABS
10.0000 mg | ORAL_TABLET | Freq: Four times a day (QID) | ORAL | Status: DC | PRN
  Administered 2023-06-16 – 2023-06-17 (×3): 10 mg via ORAL
  Filled 2023-06-16 (×3): qty 2

## 2023-06-16 MED ORDER — DULOXETINE HCL 20 MG PO CPEP
20.0000 mg | ORAL_CAPSULE | Freq: Every day | ORAL | Status: DC
  Administered 2023-06-16 – 2023-06-17 (×2): 20 mg via ORAL
  Filled 2023-06-16 (×2): qty 1

## 2023-06-16 MED ORDER — SODIUM CHLORIDE 0.9 % IV SOLN: INTRAVENOUS | Status: AC

## 2023-06-16 NOTE — Progress Notes (Signed)
0650 Pt still c/o severe LUQ pain 7/10, dilaudid 1mg  just given 0630. Will page Md and will endorse to 1st shift Rn to follow up.

## 2023-06-16 NOTE — Progress Notes (Signed)
[facility]  PROGRESS NOTE  Samuel Abbott ZOX:[id number] DOB: 08-Oct-1960 DOA: 06/15/2023 PCP: Norm Salt, PA   Brief HPI:    62 y.o. 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  Associated with lack of appetite, unintentional weight loss.   In the ED, lab studies notable for leukocytosis.  CT abdomen and pelvis with contrast revealed the following:  Several fluid collections identified have increased in size and are more confluence with a more well-defined wall throughout the left upper quadrant. Is also a new small collection adjacent to the pancreatic head. These are consistent with pseudocysts. The larger areas in the left upper quadrant do have significant mass effect along the posterior wall of the stomach and mass effect along the Spleen.      Assessment/Plan:   Abdominal pain, secondary to:  Several fluid collections identified have increased in size and are more confluence with a more well-defined wall throughout the left upper quadrant. Is also a new small collection adjacent to the pancreatic head. These are consistent with pseudocysts. -General Surgery consulted, feel this is expanding pseudocyst and not hematoma at splenic bed -Will need cystogastrostomy -GI consulted -Continue pain control, as needed IV emetics    Mild hypovolemic hyponatremia Sodium is 132   Chronic back pain Resume home regimen   Unintentional weight loss in the setting of chronic illness BMI 19 Albumin 3.0 Treat underlying conditions Dietary consultation.   Ongoing tobacco use user Nicotine patch    Medications     cyanocobalamin  1,000 mcg Oral Daily   DULoxetine  20 mg Oral Daily   lidocaine  1 patch  Transdermal QHS   nicotine  21 mg Transdermal Daily     Data Reviewed:   CBG:  Recent Labs  Lab 06/16/23 0331  GLUCAP 107*    SpO2: 100 %    Vitals:   06/15/23 2249 06/15/23 2312 06/16/23 0325 06/16/23 0834  BP:  (!) 140/76 115/86 137/76  Pulse:  79 74 66  Resp:  19 19 17   Temp: 98.4 F (36.9 C) 97.8 F (36.6 C) 98.1 F (36.7 C) (!) 97.5 F (36.4 C)  TempSrc: Oral Oral Oral Oral  SpO2:  99% 100% 100%  Weight:      Height:          Data Reviewed:  Basic Metabolic Panel: Recent Labs  Lab 06/15/23 1518 06/16/23 0636  NA 134*  134* 132*  K 4.7  4.8 3.9  CL 102  103 102  CO2 20* 23  GLUCOSE 139*  138* 110*  BUN 11  12 9   CREATININE 0.53*  0.50* 0.55*  CALCIUM 9.6 8.8*  MG  --  1.9  PHOS  --  3.4    CBC: Recent Labs  Lab 06/15/23 1518 06/16/23 0636  WBC 16.5* 13.1*  NEUTROABS 13.2*  --   HGB 10.6*  12.2* 8.9*  HCT 34.4*  36.0* 28.8*  MCV 82.9 82.5  PLT 816* 720*    LFT Recent Labs  Lab 06/15/23 1518  AST 16  ALT 13  ALKPHOS 131*  BILITOT 0.6  PROT 7.7  ALBUMIN 3.0*     Antibiotics: Anti-infectives (From admission, onward)    Start     Dose/Rate Route Frequency Ordered Stop   06/16/23 2200  levofloxacin (LEVAQUIN) IVPB  750 mg        750 mg 100 mL/hr over 90 Minutes Intravenous Every 24 hours 06/16/23 0238     06/16/23 0215  levofloxacin (LEVAQUIN) IVPB 750 mg        750 mg 100 mL/hr over 90 Minutes Intravenous  Once 06/16/23 0123 06/16/23 0455   06/16/23 0130  metroNIDAZOLE (FLAGYL) IVPB 500 mg        500 mg 100 mL/hr over 60 Minutes Intravenous Every 12 hours 06/16/23 0122          DVT prophylaxis: SCDs  Code Status: Full code  Family Communication: Discussed with patient's brother at bedside   CONSULTS General Surgery   Subjective   Continues to complain of left upper quadrant pain   Objective    Physical Examination:   General-appears in no acute distress Heart-S1-S2, regular, no murmur  auscultated Lungs-clear to auscultation bilaterally, no wheezing or crackles auscultated Abdomen-soft, tender to palpation in  left upper quadrant Extremities-no edema in the lower extremities Neuro-alert, oriented x3, no focal deficit noted  Status is: Inpatient:             Meredeth Ide   [facility] If 7PM-7AM, please contact night-coverage at [email or web address], Office  [phone]   06/16/2023, 11:38 AM  LOS: 1 day

## 2023-06-16 NOTE — Plan of Care (Signed)
Patient requires ongoing teaching and reinforcement regarding need to establish pain control with oral meds and IV meds, as well as use of NSAIDS and cold/warm therapy. Pt remains reluctant and verbally irritated that his requests for additional IV Dilaudid to 2mg  per dose has not been addressed as of yet. This RN reviewed and encouraged him to utilize.

## 2023-06-16 NOTE — Progress Notes (Addendum)
[facility] Progress Note     Subjective: CC:  Presented to the hospital with ongoing LUQ pain, poor PO intake due to early satiety. He reports 45 lbs of weight loss over the last 2-3 months. He is asking for better pain control.   Objective: Vital signs in last 24 hours: Temp:  [97.5 F (36.4 C)-98.4 F (36.9 C)] 97.5 F (36.4 C) (12/04 0834) Pulse Rate:  [66-96] 66 (12/04 0834) Resp:  [17-24] 17 (12/04 0834) BP: (101-148)/(53-94) 137/76 (12/04 0834) SpO2:  [99 %-100 %] 100 % (12/04 0834) Weight:  [65.9 kg] 65.9 kg (12/03 1451) Last BM Date : 06/15/23  Intake/Output from previous day: 12/03 0701 - 12/04 0700 In: 1157.9 [I.V.:0.8; IV Piggyback:1157.2] Out: -  Intake/Output this shift: No intake/output data recorded.  PE: Gen:  Alert, NAD, cooperative, chronically ill appearing Card:  Regular rate and rhythm Pulm:  Normal effort ORA Abd: Soft, mild distention, TTP LUQ, no cellulitis  Skin: warm and dry, no rashes  Psych: A&Ox3   Lab Results:  Recent Labs    06/15/23 1518 06/16/23 0636  WBC 16.5* 13.1*  HGB 10.6*  12.2* 8.9*  HCT 34.4*  36.0* 28.8*  PLT 816* 720*   BMET Recent Labs    06/15/23 1518 06/16/23 0636  NA 134*  134* 132*  K 4.7  4.8 3.9  CL 102  103 102  CO2 20* 23  GLUCOSE 139*  138* 110*  BUN 11  12 9   CREATININE 0.53*  0.50* 0.55*  CALCIUM 9.6 8.8*   PT/INR No results for input(s): "LABPROT", "INR" in the last 72 hours. CMP     Component Value Date/Time   NA 132 (L) 06/16/2023 0636   K 3.9 06/16/2023 0636   CL 102 06/16/2023 0636   CO2 23 06/16/2023 0636   GLUCOSE 110 (H) 06/16/2023 0636   BUN 9 06/16/2023 0636   CREATININE 0.55 (L) 06/16/2023 0636   CALCIUM 8.8 (L) 06/16/2023 0636   PROT 7.7 06/15/2023 1518   ALBUMIN 3.0 (L) 06/15/2023 1518   AST 16 06/15/2023 1518   ALT 13 06/15/2023 1518   ALKPHOS 131 (H) 06/15/2023 1518   BILITOT 0.6 06/15/2023 1518   GFRNONAA >60 06/16/2023 0636   GFRAA >60  09/10/2018 0401   Lipase     Component Value Date/Time   LIPASE 40 06/15/2023 1518       Studies/Results: CT ABDOMEN PELVIS W CONTRAST  Result Date: 06/15/2023 CLINICAL DATA:  Abdominal pain EXAM: CT ABDOMEN AND PELVIS WITH CONTRAST TECHNIQUE: Multidetector CT imaging of the abdomen and pelvis was performed using the standard protocol following bolus administration of intravenous contrast. RADIATION DOSE REDUCTION: This exam was performed according to the departmental dose-optimization program which includes automated exposure control, adjustment of the mA and/or kV according to patient size and/or use of iterative reconstruction technique. CONTRAST:  75mL OMNIPAQUE IOHEXOL 350 MG/ML SOLN COMPARISON:  CT 05/31/2023. FINDINGS: Lower chest: Slight linear opacity left lung base likely scar or atelectasis. No pleural effusion. Breathing motion at the bases. Hepatobiliary: No focal liver abnormality is seen. No gallstones, gallbladder wall thickening, or biliary dilatation. Patent portal vein. Pancreas: Multiple punctate calcifications once again seen along the pancreas consistent with chronic calcific pancreatitis. The amount of peripancreatic fat stranding has improved from previous. There is also maturing of previous fluid collections in the left upper quadrant including extending along the lesser sac and greater curve of the stomach. There is also areas adjacent to the spleen. The smaller area  along the posterior wall of the stomach previously measuring 4.2 x 1.5 cm, today on series 2, image 21 measures 5.8 x 3.5 cm. Several other well-defined lobular areas anterolateral to this are larger and more well-defined. Is also collection abutting the tail of the pancreas which is increased today measuring 4.4 x 3.8 cm. No associated air in these collections. There is also a very small fluid collection immediately above the pancreatic head and posterior to the portal vein on series 2 image 32 measuring 18 mm.  This new. Spleen: Mass effect along the spleen by the large complex fluid collection once again identified. The spleen itself is nondilated. The collection previously measured 13.5 x 9.8 cm and today when measured in the same fashion 16.3 x 12.4 cm. The wall is more well-defined. Adrenals/Urinary Tract: Adrenal glands are unremarkable. Kidneys are normal, without renal calculi, focal lesion, or hydronephrosis. Bladder is unremarkable. Stomach/Bowel: Mass effect along the stomach from the aforementioned fluid collection in the left upper quadrant. Otherwise the bowel is nondilated on this non oral contrast exam. Scattered stool. Vascular/Lymphatic: Normal caliber aorta and IVC. Mild scattered vascular calcifications. Circumaortic left renal vein. No discrete abnormal lymph node enlargement identified in the abdomen and pelvis. Reproductive: Prostate is unremarkable. Other: Improving mesenteric stranding with some residual tracking along the left side of the abdomen and along the perinephric space. Thickening of the lateral conal fascia on the left. Small amount of fluid as well adjacent to segment 3 of the liver is decreasing. Some residual. Musculoskeletal: Curvature and degenerative changes along the spine. Degenerative changes of the pelvis. Old right-sided rib fractures. Trace retrolisthesis seen of L1 on L2. IMPRESSION: Maturing changes from presumed prior episode of pancreatitis. The peripancreatic inflammatory changes are improving. There is preserved enhancement of the pancreatic parenchyma today. However several fluid collections identified have increased in size and are more confluence with a more well-defined wall throughout the left upper quadrant. Is also a new small collection adjacent to the pancreatic head. These are consistent with pseudocysts. The larger areas in the left upper quadrant do have significant mass effect along the posterior wall of the stomach and mass effect along the spleen  Electronically Signed   By: Karen Kays M.D.   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 Luisa Hart -  has a post-operative fluid collection that has always been presumed hematoma due to known intra-operative oozing from splenic bed, s/p IR drainage followed by drain removal. Cx NGTD. On CT from yesterday the collection is slightly larger, it does not contain gas/signs of infection. Per op report, Intra-operatively the pancreas was noted to be displaced superiorly and anteriorly due to the hematoma but was intact with some oozing from the pancreatic tail. I  question if the enlarging fluid collection in the splenic bed is actually a pancreatic collection at this point, as it is unlike a post-op hematoma to gradually expand over time without ongoing bleeding. When there was an IR drain in this location drain amylase levels were not checked. Will review with surgeon but, if it is a pancreatic collection then IR drainage would not be recommended.  Acute on chronic pancreatitis with interval development of pancreatic pseudocysts - I have placed a GI consult for evaluation, he may be a candidate for endoscopic cystogastrostomy once pseudocysts mature.   No emergent surgical needs. Encourage PO protein shakes. If he is unable to tolerate enough calories then I did discuss post-pyloric tube  feeeding with him. I recommended it at present, actually, and he politely declined.   Patient currently on Levaquin. I see no signs of intra-abdominal infection at this point, pseudocysts are presumed to be sterile. Ok to D/C abx    LOS: 1 day   I reviewed nursing notes, hospitalist notes, last 24 h vitals and pain scores, last 48 h intake and output, last 24 h labs and trends, and last 24 h imaging results.  This care required moderate level of medical decision making.   Hosie Spangle, PA-C [facility] Please see Amion for pager number during day hours 7:00am-4:30pm

## 2023-06-16 NOTE — Progress Notes (Signed)
Pharmacy Antibiotic Note  Samuel Abbott is a 62 y.o. male admitted on 06/15/2023 with  intra-abdominal infection .  Pharmacy has been consulted for Levaquin dosing. WBC is elevated. Renal function ok.   Plan: Levaquin 750 mg IV q24h Trend WBC, temp, renal function  F/U infectious work-up   Height: 6' (182.9 cm) Weight: 65.9 kg (145 lb 4.5 oz) IBW/kg (Calculated) : 77.6  Temp (24hrs), Avg:98 F (36.7 C), Min:97.6 F (36.4 C), Max:98.4 F (36.9 C)  Recent Labs  Lab 06/15/23 1518  WBC 16.5*  CREATININE 0.53*  0.50*    Estimated Creatinine Clearance: 91.5 mL/min (A) (by C-G formula based on SCr of 0.53 mg/dL (L)).    Allergies  Allergen Reactions   Amoxil [Amoxicillin] Anaphylaxis   Penicillins Anaphylaxis   Motrin [Ibuprofen] Other (See Comments)    Stomach pain after several uses of 800mg  tablets.   Abran Duke, PharmD, BCPS Clinical Pharmacist Phone: [phone]

## 2023-06-17 DIAGNOSIS — R109 Unspecified abdominal pain: Secondary | ICD-10-CM | POA: Diagnosis not present

## 2023-06-17 LAB — COMPREHENSIVE METABOLIC PANEL
ALT: 8 U/L (ref 0–44)
AST: 10 U/L — ABNORMAL LOW (ref 15–41)
Albumin: 2 g/dL — ABNORMAL LOW (ref 3.5–5.0)
Alkaline Phosphatase: 80 U/L (ref 38–126)
Anion gap: 8 (ref 5–15)
BUN: 8 mg/dL (ref 6–20)
CO2: 24 mmol/L (ref 22–32)
Calcium: 8.4 mg/dL — ABNORMAL LOW (ref 8.9–10.3)
Chloride: 98 mmol/L (ref 98–111)
Creatinine, Ser: 0.47 mg/dL — ABNORMAL LOW (ref 0.61–1.24)
GFR, Estimated: >60 mL/min (ref >60)
Glucose, Bld: 87 mg/dL (ref 70–99)
Potassium: 3.8 mmol/L (ref 3.5–5.1)
Sodium: 130 mmol/L — ABNORMAL LOW (ref 135–145)
Total Bilirubin: 0.5 mg/dL (ref <1.2)
Total Protein: 5.1 g/dL — ABNORMAL LOW (ref 6.5–8.1)

## 2023-06-17 LAB — CBC
HCT: 25 % — ABNORMAL LOW (ref 39.0–52.0)
Hemoglobin: 7.6 g/dL — ABNORMAL LOW (ref 13.0–17.0)
MCH: 25.2 pg — ABNORMAL LOW (ref 26.0–34.0)
MCHC: 30.4 g/dL (ref 30.0–36.0)
MCV: 82.8 fL (ref 80.0–100.0)
Platelets: 649 10*3/uL — ABNORMAL HIGH (ref 150–400)
RBC: 3.02 MIL/uL — ABNORMAL LOW (ref 4.22–5.81)
RDW: 15.9 % — ABNORMAL HIGH (ref 11.5–15.5)
WBC: 10.7 10*3/uL — ABNORMAL HIGH (ref 4.0–10.5)
nRBC: 0 % (ref 0.0–0.2)

## 2023-06-17 LAB — GLUCOSE, CAPILLARY: Glucose-Capillary: 91 mg/dL (ref 70–99)

## 2023-06-17 MED ORDER — OXYCODONE HCL ER 15 MG PO T12A
15.0000 mg | EXTENDED_RELEASE_TABLET | Freq: Two times a day (BID) | ORAL | Status: DC
  Administered 2023-06-17: 15 mg via ORAL
  Filled 2023-06-17: qty 1

## 2023-06-17 MED ORDER — NICOTINE 21 MG/24HR TD PT24: 21.0000 mg | MEDICATED_PATCH | Freq: Every day | TRANSDERMAL | 0 refills | Status: AC

## 2023-06-17 MED ORDER — OXYCODONE HCL 5 MG PO TABS: 10.0000 mg | ORAL_TABLET | ORAL | Status: DC | PRN

## 2023-06-17 MED ORDER — SODIUM CHLORIDE 0.9 % IV SOLN
12.5000 mg | Freq: Four times a day (QID) | INTRAVENOUS | Status: DC | PRN
  Administered 2023-06-17: 12.5 mg via INTRAVENOUS
  Filled 2023-06-17: qty 12.5

## 2023-06-17 MED ORDER — LIDOCAINE 5 % EX PTCH: 1.0000 | MEDICATED_PATCH | Freq: Every day | CUTANEOUS | 0 refills | Status: AC

## 2023-06-17 NOTE — Progress Notes (Signed)
Call placed to [facility] [phone], spoke with Kathlene November. Transportation arranged for Hospital transfer to [facility], [place], [place] campus. [employer], [address]th Floor, [place] [address]. Receiving Dr Gwendalyn Ege.  Report called to Floor at [phone], Bertell Maria, RN.

## 2023-06-17 NOTE — Care Plan (Signed)
Patient is transferred to another facility and IV is left in place.

## 2023-06-17 NOTE — Progress Notes (Signed)
Accepting physician at [facility] hospitalist Dr. Gwendalyn Ege

## 2023-06-17 NOTE — Progress Notes (Addendum)
   06/17/23 1136  TOC Brief Assessment  Insurance and Status Reviewed  Patient has primary care physician Yes  Home environment has been reviewed lives alone  Prior level of function: independent  Prior/Current Home Services No current home services  Social Determinants of Health Reivew SDOH reviewed no interventions necessary  Readmission risk has been reviewed Yes  Transition of care needs no transition of care needs at this time   Possible transfer  to another hospital for endoscopic cystgastrostomy.

## 2023-06-17 NOTE — Discharge Summary (Addendum)
Physician Discharge Summary   Patient: Samuel Abbott MRN: [id number] DOB: 13-Jan-1961  Admit date:     06/15/2023  Discharge date: 06/17/23  Discharge Physician: Meredeth Ide   PCP: Norm Salt, PA   Recommendations at discharge:   Transfer to [facility]  Discharge Diagnoses: Active Problems:   Abdominal pain  Resolved Problems:   * No resolved hospital problems. *  Hospital Course:   62 y.o. 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  Associated with lack of appetite, unintentional weight loss.   In the ED, lab studies notable for leukocytosis.  CT abdomen and pelvis with contrast revealed the following:  Several fluid collections identified have increased in size and are more confluence with a more well-defined wall throughout the left upper quadrant. Is also a new small collection adjacent to the pancreatic head. These are consistent with pseudocysts. The larger areas in the left upper quadrant do have significant mass effect along the posterior wall of the stomach and mass effect along the Spleen.   Assessment and Plan:  Abdominal pain, secondary to multiple large pseudocysts:  Several fluid collections identified have increased in size and are more confluence with a more well-defined wall throughout the left upper quadrant. Is also a new small collection adjacent to the pancreatic head. These are consistent with pseudocysts. -General Surgery consulted, feel this is expanding pseudocyst and not hematoma at splenic bed -Will need cystogastrostomy -GI consulted; no GI available at [facility] for this procedure [facility] contacted, discussed with GI at [facility]  Dr Rodman Comp.  He is okay  with transfer to [facility]. -Hospitalist Dr. Gwendalyn Ege has accepted the patient in transfer at [facility]      Mild hypovolemic hyponatremia Sodium is 130 -Follow-up BMP in am   Chronic back pain -Started on OxyContin and as needed with IV Dilaudid    Unintentional weight loss in the setting of chronic illness BMI 19 Albumin 3.0 Treat underlying conditions Dietary consultation.   Ongoing tobacco use user Nicotine patch          Consultants: Gastroenterology, general surgery Procedures performed:   Disposition: To be transferred to [facility] Diet recommendation:  Discharge Diet Orders (From admission, onward)     Start     Ordered   06/17/23 0000  Diet - low sodium heart healthy        06/17/23 1645           Regular diet DISCHARGE MEDICATION: Allergies as of 06/17/2023       Reactions   Amoxil [amoxicillin] Anaphylaxis   Penicillins Anaphylaxis   Motrin [ibuprofen] Other (See Comments)   Stomach pain after several uses of 800mg  tablets.        Medication List     STOP taking these medications    ibuprofen 800 MG tablet Commonly known as: ADVIL       TAKE these medications    acetaminophen 500 MG tablet Commonly known as: TYLENOL Take 2 tablets (1,000 mg total) by mouth every 6 (six) hours as needed for mild pain, moderate pain, fever or headache.   DULoxetine 20 MG capsule Commonly known as: CYMBALTA Take 20 mg by mouth 2 (two) times daily. For back pain   IRON PO Take 1  tablet by mouth daily.   lidocaine 5 % Commonly known as: LIDODERM Place 1 patch onto the skin at bedtime. Remove & Discard patch within 12 hours or as directed by MD   losartan 25 MG tablet Commonly known as: COZAAR Take 25 mg by mouth daily.   Mens Multivitamin Tabs Take 1 tablet by mouth daily.   metFORMIN 500 MG tablet Commonly known as: Glucophage Take 1 tablet (500 mg total) by mouth daily with breakfast. What  changed:  how much to take when to take this   methocarbamol 500 MG tablet Commonly known as: ROBAXIN Take 1 tablet (500 mg total) by mouth every 6 (six) hours as needed for muscle spasms.   nicotine 21 mg/24hr patch Commonly known as: NICODERM CQ - dosed in mg/24 hours Place 1 patch (21 mg total) onto the skin daily. Start taking on: June 18, 2023   ondansetron 4 MG tablet Commonly known as: Zofran Take 1 tablet (4 mg total) by mouth every 8 (eight) hours as needed for nausea or vomiting.   oxyCODONE 5 MG immediate release tablet Commonly known as: Oxy IR/ROXICODONE Take 1-2 tablets (5-10 mg total) by mouth every 6 (six) hours as needed for severe pain (pain score 7-10).   pantoprazole 40 MG tablet Commonly known as: PROTONIX Take 1 tablet (40 mg total) by mouth daily.   tiZANidine 4 MG tablet Commonly known as: ZANAFLEX Take 4 mg by mouth every 8 (eight) hours as needed for muscle spasms.   VITAMIN B-12 PO Take 1 tablet by mouth daily.   VITAMIN D-3 PO Take 1 capsule by mouth daily.        Discharge Exam: Filed Weights   06/15/23 1451  Weight: 65.9 kg   General-appears in no acute distress Heart-S1-S2, regular, no murmur auscultated Lungs-clear to auscultation bilaterally, no wheezing or crackles auscultated Abdomen-soft, mild left upper quadrant tenderness to palpation Extremities-no edema in the lower extremities Neuro-alert, oriented x3, no focal deficit noted  Condition at discharge: good  The results of significant diagnostics from this hospitalization (including imaging, microbiology, ancillary and laboratory) are listed below for reference.   Imaging Studies: CT ABDOMEN PELVIS W CONTRAST  Result Date: 06/15/2023 CLINICAL DATA:  Abdominal pain EXAM: CT ABDOMEN AND PELVIS WITH CONTRAST TECHNIQUE: Multidetector CT imaging of the abdomen and pelvis was performed using the standard protocol following bolus administration of intravenous contrast.  RADIATION DOSE REDUCTION: This exam was performed according to the departmental dose-optimization program which includes automated exposure control, adjustment of the mA and/or kV according to patient size and/or use of iterative reconstruction technique. CONTRAST:  75mL OMNIPAQUE IOHEXOL 350 MG/ML SOLN COMPARISON:  CT 05/31/2023. FINDINGS: Lower chest: Slight linear opacity left lung base likely scar or atelectasis. No pleural effusion. Breathing motion at the bases. Hepatobiliary: No focal liver abnormality is seen. No gallstones, gallbladder wall thickening, or biliary dilatation. Patent portal vein. Pancreas: Multiple punctate calcifications once again seen along the pancreas consistent with chronic calcific pancreatitis. The amount of peripancreatic fat stranding has improved from previous. There is also maturing of previous fluid collections in the left upper quadrant including extending along the lesser sac and greater curve of the stomach. There is also areas adjacent to the spleen. The smaller area along the posterior wall of the stomach previously measuring 4.2 x 1.5 cm, today on series 2, image 21 measures 5.8 x 3.5 cm. Several other well-defined lobular areas anterolateral to this are larger and more well-defined. Is also collection abutting the tail  of the pancreas which is increased today measuring 4.4 x 3.8 cm. No associated air in these collections. There is also a very small fluid collection immediately above the pancreatic head and posterior to the portal vein on series 2 image 32 measuring 18 mm. This new. Spleen: Mass effect along the spleen by the large complex fluid collection once again identified. The spleen itself is nondilated. The collection previously measured 13.5 x 9.8 cm and today when measured in the same fashion 16.3 x 12.4 cm. The wall is more well-defined. Adrenals/Urinary Tract: Adrenal glands are unremarkable. Kidneys are normal, without renal calculi, focal lesion, or  hydronephrosis. Bladder is unremarkable. Stomach/Bowel: Mass effect along the stomach from the aforementioned fluid collection in the left upper quadrant. Otherwise the bowel is nondilated on this non oral contrast exam. Scattered stool. Vascular/Lymphatic: Normal caliber aorta and IVC. Mild scattered vascular calcifications. Circumaortic left renal vein. No discrete abnormal lymph node enlargement identified in the abdomen and pelvis. Reproductive: Prostate is unremarkable. Other: Improving mesenteric stranding with some residual tracking along the left side of the abdomen and along the perinephric space. Thickening of the lateral conal fascia on the left. Small amount of fluid as well adjacent to segment 3 of the liver is decreasing. Some residual. Musculoskeletal: Curvature and degenerative changes along the spine. Degenerative changes of the pelvis. Old right-sided rib fractures. Trace retrolisthesis seen of L1 on L2. IMPRESSION: Maturing changes from presumed prior episode of pancreatitis. The peripancreatic inflammatory changes are improving. There is preserved enhancement of the pancreatic parenchyma today. However several fluid collections identified have increased in size and are more confluence with a more well-defined wall throughout the left upper quadrant. Is also a new small collection adjacent to the pancreatic head. These are consistent with pseudocysts. The larger areas in the left upper quadrant do have significant mass effect along the posterior wall of the stomach and mass effect along the spleen Electronically Signed   By: Karen Kays M.D.   On: 06/15/2023 16:13   CT ABDOMEN PELVIS W CONTRAST  Result Date: 05/31/2023 CLINICAL DATA:  Abdominal pain, chest pain, fever EXAM: CT ABDOMEN AND PELVIS WITH CONTRAST TECHNIQUE: Multidetector CT imaging of the abdomen and pelvis was performed using the standard protocol following bolus administration of intravenous contrast. RADIATION DOSE REDUCTION:  This exam was performed according to the departmental dose-optimization program which includes automated exposure control, adjustment of the mA and/or kV according to patient size and/or use of iterative reconstruction technique. CONTRAST:  80mL OMNIPAQUE IOHEXOL 300 MG/ML  SOLN COMPARISON:  05/20/2023 FINDINGS: Lower chest: Trace left pleural effusion. Left base atelectasis. Findings stable since prior study. Hepatobiliary: No focal hepatic abnormality. Gallbladder unremarkable. Pancreas: Calcifications in the pancreatic head. Stranding around the pancreas, new since prior study, best seen around the body and tail suspicious for acute pancreatitis. Fluid also noted tracking along the greater curvature of the stomach with small focal fluid collection posterior to the stomach measuring 4.2 x 1.5 cm. Spleen: Reported prior splenectomy. There appears to be some residual splenic tissue. Large fluid collection in the left upper quadrant measuring 13.5 x 9.8 cm. Previously seen pigtail drainage catheter within this fluid collection no longer visualized. Adrenals/Urinary Tract: No adrenal abnormality. No focal renal abnormality. No stones or hydronephrosis. Urinary bladder is unremarkable. Stomach/Bowel: Mild wall thickening of along the greater curvature of the stomach, likely secondary inflammation related to inflamed pancreas. Normal appendix. Vascular/Lymphatic: No evidence of aneurysm or adenopathy. Reproductive: No visible focal abnormality. Other: No free  fluid or free air. Musculoskeletal: No acute bony abnormality. IMPRESSION: Persistent large left upper quadrant fluid collection measuring 13.5 x 9.8 cm. Previously placed pigtail drainage catheter no longer visualized within the fluid collection. Stranding/inflammation around the pancreatic body and tail concerning for acute pancreatitis. Secondary wall thickening/inflammation along the greater curvature of the stomach with 4.2 x 1.5 cm fluid collection posterior  to the gastric wall. Trace left pleural effusion, left base atelectasis. Electronically Signed   By: Charlett Nose M.D.   On: 05/31/2023 23:38   DG Chest Port 1 View  Result Date: 05/31/2023 CLINICAL DATA:  Central chest pain cough, fever. EXAM: PORTABLE CHEST 1 VIEW COMPARISON:  05/20/2023. FINDINGS: The heart size and mediastinal contours are within normal limits. Mild atelectasis or scarring is noted at the left lung base. No effusion or pneumothorax is seen. No acute osseous abnormality. IMPRESSION: Mild atelectasis or scarring at the left lung base. Electronically Signed   By: Thornell Sartorius M.D.   On: 05/31/2023 22:15   CT GUIDED PERITONEAL/RETROPERITONEAL FLUID DRAIN BY PERC CATH  Result Date: 05/20/2023 INDICATION: 62 year old gentleman with left upper quadrant hematoma/fluid collection status post splenectomy presented to the ED with intractable abdominal pain. IR consulted for drain placement. EXAM: CT-guided left upper quadrant drain placement TECHNIQUE: Multidetector CT imaging of the abdomen was performed following the standard protocol without IV contrast. RADIATION DOSE REDUCTION: This exam was performed according to the departmental dose-optimization program which includes automated exposure control, adjustment of the mA and/or kV according to patient size and/or use of iterative reconstruction technique. MEDICATIONS: The patient is currently admitted to the hospital and receiving intravenous antibiotics. The antibiotics were administered within an appropriate time frame prior to the initiation of the procedure. ANESTHESIA/SEDATION: Moderate (conscious) sedation was employed during this procedure. A total of Versed 4 mg and Fentanyl 100 mcg was administered intravenously by the radiology nurse. Total intra-service moderate Sedation Time: 22 minutes. The patient's level of consciousness and vital signs were monitored continuously by radiology nursing throughout the procedure under my direct  supervision. COMPLICATIONS: None immediate. PROCEDURE: Informed written consent was obtained from the patient after a thorough discussion of the procedural risks, benefits and alternatives. All questions were addressed. Maximal Sterile Barrier Technique was utilized including caps, mask, sterile gowns, sterile gloves, sterile drape, hand hygiene and skin antiseptic. A timeout was performed prior to the initiation of the procedure. Patient positioned supine on the procedure table. The left upper quadrant skin prepped and draped in the usual fashion. Following local administration, the left upper quadrant fluid collection was accessed with a 19 gauge Yueh needle utilizing CT guidance. Yueh needle was removed over 0.035 inch guidewire. Serial dilation was performed followed by placement of 12 [place] multipurpose pigtail drain. Approximately 400 mL of bloody fluid was aspirated. Samples were sent for Gram stain and culture. IMPRESSION: Successful CT-guided insertion of 12 French multipurpose pigtail drain in symptomatic left upper quadrant post splenectomy hematoma. Electronically Signed   By: Acquanetta Belling M.D.   On: 05/20/2023 15:48   CT Angio Chest/Abd/Pel for Dissection W and/or W/WO  Addendum Date: 05/20/2023   ADDENDUM REPORT: 05/20/2023 02:02 ADDENDUM: Additional history was provided in that the patient has recently undergone splenectomy via a chevron incision. In light of this, the collection seen within the left upper quadrant represents a large hematoma within the splenic bed measuring 9.8 x 14.1 x 10.6 cm (volume = 770 cm^3) with a more hyperdense region centrally possibly representing sentinel clot or hemostatic packing material.  There are punctate foci of gas within the hematoma at the superior margin though this is nonspecific and may simply represent gas related to open splenectomy. Super infection, however, is not excluded on this examination alone and correlation with the patient's clinical picture  is recommended. If indicated, diagnostic sampling of the hematoma for microbial analysis could be considered. Electronically Signed   By: Helyn Numbers M.D.   On: 05/20/2023 02:02   Result Date: 05/20/2023 CLINICAL DATA:  Acute aortic syndrome, abdominal pain, diaphoresis. 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 Multidetector CT imaging through the chest, abdomen and pelvis was performed using the standard protocol during bolus administration of intravenous contrast. Multiplanar reconstructed images and MIPs were obtained and reviewed to evaluate the vascular anatomy. RADIATION DOSE REDUCTION: This exam was performed according to the departmental dose-optimization program which includes automated exposure control, adjustment of the mA and/or kV according to patient size and/or use of iterative reconstruction technique. CONTRAST:  OMNIPAQUE IOHEXOL 350 MG/ML SOLN COMPARISON:  04/20/2023. FINDINGS: CTA CHEST FINDINGS Cardiovascular: Preferential opacification of the thoracic aorta. No evidence of thoracic aortic aneurysm or dissection. Normal heart size. No pericardial effusion. Mediastinum/Nodes: No enlarged mediastinal, hilar, or axillary lymph nodes. Thyroid gland, trachea, and esophagus demonstrate no significant findings. Lungs/Pleura: Mild emphysema. No confluent pulmonary infiltrate. No pneumothorax or pleural effusion. No central obstructing lesion. Musculoskeletal: No chest wall abnormality. No acute or significant osseous findings. Review of the MIP images confirms the above findings. CTA ABDOMEN AND PELVIS FINDINGS VASCULAR Aorta: Normal caliber aorta without aneurysm, dissection, vasculitis or significant stenosis. Celiac: Since the prior examination, the pseudoaneurysm within the splenic hilum has thrombosed. Additionally, there is markedly 2 decreased  caliber of the splenic artery diffusely which may relate to interval near complete infarction of the spleen described below. The celiac axis itself is widely patent, as is the left gastric artery and common hepatic artery. No aneurysm or dissection. SMA: Patent without evidence of aneurysm, dissection, vasculitis or significant stenosis. Renals: Main renal arteries bilaterally are widely patent and demonstrate normal vascular morphology. Tiny bilateral accessory lower pole renal arteries are noted. IMA: Patent without evidence of aneurysm, dissection, vasculitis or significant stenosis. Inflow: Patent without evidence of aneurysm, dissection, vasculitis or significant stenosis. Veins: No obvious venous abnormality within the limitations of this arterial phase study. Review of the MIP images confirms the above findings. NON-VASCULAR Hepatobiliary: No focal liver abnormality is seen. No gallstones, gallbladder wall thickening, or biliary dilatation. Pancreas: Punctate calcifications within the pancreatic tail, head, and uncinate process are identified in keeping with changes of chronic pancreatitis. No superimposed acute peripancreatic inflammatory changes or fluid collections are identified. Pancreatic duct is not dilated. 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 Small amount of residual enhancing tissue is seen within the splenic hilum. Adrenals/Urinary Tract: Adrenal glands are unremarkable. Kidneys are normal, without renal calculi, focal lesion, or hydronephrosis. Bladder is unremarkable. Stomach/Bowel: Interval development of mildly hyperdense fluid throughout the abdomen in keeping with mild hemoperitoneum. This is of relatively low attenuation, however (16-18 Hounsfield units) suggesting that this may be subacute in nature or reflect underlying anemia. The stomach, small bowel, and large bowel are unremarkable. Appendix normal. No free intraperitoneal gas. Lymphatic: No pathologic adenopathy  within the abdomen and pelvis. Reproductive: Prostate is unremarkable. Other: No abdominal wall hernia Musculoskeletal: Degenerative changes are seen at L1-2. No acute bone abnormality. No lytic  or blastic bone lesion. Review of the MIP images confirms the above findings. IMPRESSION: 1. 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 2. Interval development of mild hemoperitoneum throughout the abdomen. This is of relatively low attenuation, however, suggesting that this may be subacute in nature or reflect underlying anemia. 3. No evidence of thoracic or abdominal aortic aneurysm or dissection. 4. Changes of chronic pancreatitis. No superimposed acute peripancreatic inflammatory changes or fluid collections are identified. Emphysema (ICD10-J43.9). Electronically Signed: By: Helyn Numbers M.D. On: 05/20/2023 01:03   DG Chest Port 1 View  Result Date: 05/19/2023 CLINICAL DATA:  Chest and abdominal pain EXAM: PORTABLE CHEST 1 VIEW COMPARISON:  04/22/2023 FINDINGS: Single frontal view of the chest demonstrates a stable cardiac silhouette. Chronic elevation of the left hemidiaphragm. No acute airspace disease, effusion, or pneumothorax. No acute bony abnormality. IMPRESSION: 1. Stable chest, no acute process. Electronically Signed   By: Sharlet Salina M.D.   On: 05/19/2023 23:52    Microbiology: Results for orders placed or performed during the hospital encounter of 06/15/23  Culture, blood (Routine X 2) w Reflex to ID Panel     Status: None (Preliminary result)   Collection Time: 06/16/23 12:17 AM   Specimen: BLOOD  Result Value Ref Range Status   Specimen Description BLOOD BLOOD RIGHT ARM  Final   Special Requests   Final    BOTTLES DRAWN AEROBIC AND ANAEROBIC Blood Culture results may not be optimal due to an inadequate volume of blood received in culture bottles   Culture   Final    NO GROWTH 1 DAY Performed at [facility]  [facility], [address] [place], [place] [address]    Report Status PENDING  Incomplete  Culture, blood (Routine X 2) w Reflex to ID Panel     Status: None (Preliminary result)   Collection Time: 06/16/23 12:20 AM   Specimen: BLOOD  Result Value Ref Range Status   Specimen Description BLOOD BLOOD LEFT HAND  Final   Special Requests   Final    BOTTLES DRAWN AEROBIC AND ANAEROBIC Blood Culture adequate volume   Culture   Final    NO GROWTH 1 DAY Performed at [facility], [address], [place], [place] [address]    Report Status PENDING  Incomplete    Labs: CBC: Recent Labs  Lab 06/15/23 1518 06/16/23 0636 06/17/23 0521  WBC 16.5* 13.1* 10.7*  NEUTROABS 13.2*  --   --   HGB 10.6*  12.2* 8.9* 7.6*  HCT 34.4*  36.0* 28.8* 25.0*  MCV 82.9 82.5 82.8  PLT 816* 720* 649*   Basic Metabolic Panel: Recent Labs  Lab 06/15/23 1518 06/16/23 0636 06/17/23 0521  NA 134*  134* 132* 130*  K 4.7  4.8 3.9 3.8  CL 102  103 102 98  CO2 20* 23 24  GLUCOSE 139*  138* 110* 87  BUN 11  12 9 8   CREATININE 0.53*  0.50* 0.55* 0.47*  CALCIUM 9.6 8.8* 8.4*  MG  --  1.9  --   PHOS  --  3.4  --    Liver Function Tests: Recent Labs  Lab 06/15/23 1518 06/17/23 0521  AST 16 10*  ALT 13 8  ALKPHOS 131* 80  BILITOT 0.6 0.5  PROT 7.7 5.1*  ALBUMIN 3.0* 2.0*   CBG: Recent Labs  Lab 06/16/23 0331 06/17/23 0404  GLUCAP 107* 91    Discharge time spent: greater  than 30 minutes.  Signed: Meredeth Ide, MD [facility] 06/17/2023

## 2023-06-17 NOTE — Progress Notes (Signed)
Handoff given to [place], Charity fundraiser for duration of pt's stay while awaiting Transportation to arrive to transport him to [facility].  Debby Bud advised pt requests pain medication prior to D/c and AVS completed/printed as per protocol for Transport Team. Understanding verbalized and pt is aware of the same.  Silva Bandy, RN on [employer] called and received report from this RN also.  Patient waiting with usual c/o pain. VS WNL for him. He has belongings bagged and ready to transfer upon arrival.

## 2023-06-17 NOTE — Plan of Care (Signed)

## 2023-06-17 NOTE — Consult Note (Signed)
[facility] Consultation Note  Referring Provider: [facility] Primary Care Physician:  Norm Salt, [place] Primary Gastroenterologist:  [facility]  Reason for Consultation:  pancreatic pseudocysts  HPI: Samuel Abbott is a 62 y.o. 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  Had splenic hematoma requiring surgery about two months ago.  Had splenectomy.  Has been in and out hospital several times since then, now with large confluence of peripancreatic cysts with pressure effect on stomach.  Ongoing significant left-sided abdominal pain.  On going nausea/vomiting and early satiety and 40 lb weight loss.  No alcohol.   Past Medical History:  Diagnosis Date   Acute pancreatitis    Alcohol abuse    Diabetes mellitus without complication (HCC)    Eczema    High cholesterol    Hypertension    Tobacco abuse     Past Surgical History:  Procedure Laterality Date   SPLENECTOMY, TOTAL N/A 04/21/2023   Procedure: OPEN SPLENECTOMY;  Surgeon: [name], MD;  Location: [facility] OR;  Service: General;  Laterality: N/A;   WRIST SURGERY      Prior to Admission medications   Medication Sig Start Date End Date Taking? Authorizing Provider  acetaminophen (TYLENOL) 500 MG tablet Take 2 tablets (1,000 mg total) by mouth every 6 (six) hours as needed for mild pain, moderate pain, fever or headache. 04/23/23  Yes Barnetta Chapel, PA-C  Cholecalciferol (VITAMIN D-3 PO) Take 1 capsule by mouth daily.   Yes [provider]  Cyanocobalamin (VITAMIN B-12 PO) Take 1 tablet by mouth daily.   Yes [provider]  DULoxetine (CYMBALTA) 20 MG capsule Take 20 mg by mouth 2 (two) times daily. For back pain 01/13/21  Yes [provider]  Ferrous Sulfate (IRON PO) Take 1 tablet by mouth daily.   Yes [provider]  ibuprofen (ADVIL) 800 MG tablet Take 800 mg by mouth daily as needed for moderate pain (pain score 4-6).   Yes [provider]   losartan (COZAAR) 25 MG tablet Take 25 mg by mouth daily. 09/16/20  Yes [provider]  metFORMIN (GLUCOPHAGE) 500 MG tablet Take 1 tablet (500 mg total) by mouth daily with breakfast. Patient taking differently: Take 250 mg by mouth 2 (two) times daily. 01/24/21 06/19/23 Yes Maness, Loistine Chance, MD  methocarbamol (ROBAXIN) 500 MG tablet Take 1 tablet (500 mg total) by mouth every 6 (six) hours as needed for muscle spasms. 06/03/23  Yes Meuth, Brooke A, PA-C  Multiple Vitamins-Minerals (MENS MULTIVITAMIN) TABS Take 1 tablet by mouth daily.   Yes [provider]  tiZANidine (ZANAFLEX) 4 MG tablet Take 4 mg by mouth every 8 (eight) hours as needed for muscle spasms. 05/10/23  Yes [provider]  ondansetron (ZOFRAN) 4 MG tablet Take 1 tablet (4 mg total) by mouth every 8 (eight) hours as needed for nausea or vomiting. Patient not taking: Reported on 06/16/2023 06/03/23   Carlena Bjornstad A, PA-C  oxyCODONE (OXY IR/ROXICODONE) 5 MG immediate release tablet Take 1-2 tablets (5-10 mg total) by mouth every 6 (six) hours as needed for severe pain (pain score 7-10). Patient not taking: Reported on 06/16/2023 06/03/23   Carlena Bjornstad A, PA-C  pantoprazole (PROTONIX) 40 MG tablet Take 1 tablet (40 mg total) by mouth daily. Patient not taking: Reported on 06/16/2023 06/04/23   Rodolph Bong, MD    Current Facility-Administered Medications  Medication Dose Route Frequency Provider Last Rate Last Admin   acetaminophen (TYLENOL) tablet 500 mg  500 mg Oral Q6H PRN Dow Adolph N, DO       cyanocobalamin (VITAMIN B12) tablet 1,000 mcg  1,000 mcg Oral Daily Dow Adolph N, DO   1,000 mcg at 06/16/23 1000   DULoxetine (CYMBALTA) DR capsule 20 mg  20 mg Oral Daily Dow Adolph N, DO   20 mg at 06/16/23 1031   HYDROmorphone (DILAUDID) injection 1 mg  1 mg Intravenous Q4H PRN Dow Adolph N, DO   1 mg at 06/17/23 0845   levofloxacin (LEVAQUIN) IVPB 750 mg  750 mg Intravenous Q24H Stevphen Rochester, RPH   Stopped at 06/17/23 0014   lidocaine (LIDODERM) 5 % 1 patch  1 patch Transdermal QHS Dow Adolph N, DO   1 patch at 06/16/23 2146   metroNIDAZOLE (FLAGYL) IVPB 500 mg  500 mg Intravenous Q12H Darlin Drop, DO 100 mL/hr at 06/16/23 2129 500 mg at 06/16/23 2129   nicotine (NICODERM CQ - dosed in mg/24 hours) patch 21 mg  21 mg Transdermal Daily Dow Adolph N, DO   21 mg at 06/16/23 1008   ondansetron (ZOFRAN) injection 4 mg  4 mg Intravenous Q6H PRN Valrie Hart F, PA-C   4 mg at 06/16/23 1301   oxyCODONE (OXYCONTIN) 12 hr tablet 15 mg  15 mg Oral Q12H Lama, Sarina Ill, MD       pantoprazole (PROTONIX) EC tablet 40 mg  40 mg Oral Q1200 Adam Phenix, PA-C   40 mg at 06/16/23 1409    Allergies as of 06/15/2023 - Review Complete 06/15/2023  Allergen Reaction Noted   Amoxil [amoxicillin] Anaphylaxis 01/22/2021   Penicillins Anaphylaxis 09/08/2018   Motrin [ibuprofen] Other (See Comments) 04/21/2023    Family History  Problem Relation Age of Onset   CVA Father     Social History   Socioeconomic History   Marital status: Single    Spouse name: Not on file   Number of children: Not on file   Years of education: Not on file   Highest education level: Not on file  Occupational History   Not on file  Tobacco Use   Smoking status: Every Day    Current packs/day: 1.50    Average packs/day: 1.5 packs/day for 40.0 years (60.0 ttl pk-yrs)    Types: Cigarettes   Smokeless tobacco: Never  Substance and Sexual Activity   Alcohol use: Not Currently    Comment: States no alcohol in over a week   Drug use: Not Currently   Sexual activity: Not Currently  Other Topics Concern   Not on file  Social History Narrative   Not on file   Social Determinants of Health   Financial Resource Strain: Not on file  Food Insecurity: No Food Insecurity (06/15/2023)   Hunger Vital Sign    Worried About Running Out of Food in the Last Year: Never true    Ran Out of Food in the Last  Year: Never true  Transportation Needs: No Transportation Needs (06/15/2023)   PRAPARE - Administrator, Civil Service (Medical): No    Lack of Transportation (Non-Medical): No  Physical Activity: Not on file  Stress: Not on file  Social Connections: Not on file  Intimate Partner Violence: Not At Risk (06/15/2023)   Humiliation, Afraid, Rape, and Kick questionnaire    Fear of Current or Ex-Partner: No    Emotionally Abused: No    Physically Abused: No    Sexually Abused: No    Review of Systems:  As per HPI all others negative.  Physical Exam: Vital signs in last 24 hours: Temp:  [98.1 F (36.7 C)-98.7 F (37.1 C)] 98.7 F (37.1 C) (12/05 0820) Pulse Rate:  [70-88] 88 (12/05 0820) Resp:  [16-18] 18 (12/05 0820) BP: (104-140)/(62-78) 114/63 (12/05 0820) SpO2:  [97 %-100 %] 100 % (12/05 0820) Last BM Date : 06/15/23 General:   Alert,  thin, uncomfortable-appearing but not toxic. Head:  Normocephalic and atraumatic. Eyes:  Sclera clear, no icterus.   Conjunctiva pink. Ears:  Normal auditory acuity. Nose:  No deformity, discharge,  or lesions. Mouth:  No deformity or lesions.  Oropharynx pink & moist. Neck:  Supple; no masses or thyromegaly. Lungs:  Clear throughout to auscultation.   No wheezes, crackles, or rhonchi. No acute distress. Heart:  Regular rate and rhythm; no murmurs, clicks, rubs,  or gallops. Abdomen:  Mild-moderate distention, mild-moderate generalized tenderness without peritonitis, No masses, hepatosplenomegaly or hernias noted. Normal bowel sounds, without guarding, and without rebound.     Msk:  Symmetrical without gross deformities. Normal posture. Pulses:  Normal pulses noted. Extremities:  Without clubbing or edema. Neurologic:  Alert and  oriented x4;  grossly normal neurologically. Skin:  Intact without significant lesions or rashes. Psych:  Alert and cooperative. Normal mood and affect.   Lab Results: Recent Labs    06/15/23 1518  06/16/23 0636 06/17/23 0521  WBC 16.5* 13.1* 10.7*  HGB 10.6*  12.2* 8.9* 7.6*  HCT 34.4*  36.0* 28.8* 25.0*  PLT 816* 720* 649*   BMET Recent Labs    06/15/23 1518 06/16/23 0636 06/17/23 0521  NA 134*  134* 132* 130*  K 4.7  4.8 3.9 3.8  CL 102  103 102 98  CO2 20* 23 24  GLUCOSE 139*  138* 110* 87  BUN 11  12 9 8   CREATININE 0.53*  0.50* 0.55* 0.47*  CALCIUM 9.6 8.8* 8.4*   LFT Recent Labs    06/17/23 0521  PROT 5.1*  ALBUMIN 2.0*  AST 10*  ALT 8  ALKPHOS 80  BILITOT 0.5   PT/INR No results for input(s): "LABPROT", "INR" in the last 72 hours.  Studies/Results: CT ABDOMEN PELVIS W CONTRAST  Result Date: 06/15/2023 CLINICAL DATA:  Abdominal pain EXAM: CT ABDOMEN AND PELVIS WITH CONTRAST TECHNIQUE: Multidetector CT imaging of the abdomen and pelvis was performed using the standard protocol following bolus administration of intravenous contrast. RADIATION DOSE REDUCTION: This exam was performed according to the departmental dose-optimization program which includes automated exposure control, adjustment of the mA and/or kV according to patient size and/or use of iterative reconstruction technique. CONTRAST:  75mL OMNIPAQUE IOHEXOL 350 MG/ML SOLN COMPARISON:  CT 05/31/2023. FINDINGS: Lower chest: Slight linear opacity left lung base likely scar or atelectasis. No pleural effusion. Breathing motion at the bases. Hepatobiliary: No focal liver abnormality is seen. No gallstones, gallbladder wall thickening, or biliary dilatation. Patent portal vein. Pancreas: Multiple punctate calcifications once again seen along the pancreas consistent with chronic calcific pancreatitis. The amount of peripancreatic fat stranding has improved from previous. There is also maturing of previous fluid collections in the left upper quadrant including extending along the lesser sac and greater curve of the stomach. There is also areas adjacent to the spleen. The smaller area along the posterior  wall of the stomach previously measuring 4.2 x 1.5 cm, today on series 2, image 21 measures 5.8 x 3.5 cm. Several other well-defined lobular areas anterolateral to this are larger and more well-defined. Is also collection  abutting the tail of the pancreas which is increased today measuring 4.4 x 3.8 cm. No associated air in these collections. There is also a very small fluid collection immediately above the pancreatic head and posterior to the portal vein on series 2 image 32 measuring 18 mm. This new. Spleen: Mass effect along the spleen by the large complex fluid collection once again identified. The spleen itself is nondilated. The collection previously measured 13.5 x 9.8 cm and today when measured in the same fashion 16.3 x 12.4 cm. The wall is more well-defined. Adrenals/Urinary Tract: Adrenal glands are unremarkable. Kidneys are normal, without renal calculi, focal lesion, or hydronephrosis. Bladder is unremarkable. Stomach/Bowel: Mass effect along the stomach from the aforementioned fluid collection in the left upper quadrant. Otherwise the bowel is nondilated on this non oral contrast exam. Scattered stool. Vascular/Lymphatic: Normal caliber aorta and IVC. Mild scattered vascular calcifications. Circumaortic left renal vein. No discrete abnormal lymph node enlargement identified in the abdomen and pelvis. Reproductive: Prostate is unremarkable. Other: Improving mesenteric stranding with some residual tracking along the left side of the abdomen and along the perinephric space. Thickening of the lateral conal fascia on the left. Small amount of fluid as well adjacent to segment 3 of the liver is decreasing. Some residual. Musculoskeletal: Curvature and degenerative changes along the spine. Degenerative changes of the pelvis. Old right-sided rib fractures. Trace retrolisthesis seen of L1 on L2. IMPRESSION: Maturing changes from presumed prior episode of pancreatitis. The peripancreatic inflammatory changes are  improving. There is preserved enhancement of the pancreatic parenchyma today. However several fluid collections identified have increased in size and are more confluence with a more well-defined wall throughout the left upper quadrant. Is also a new small collection adjacent to the pancreatic head. These are consistent with pseudocysts. The larger areas in the left upper quadrant do have significant mass effect along the posterior wall of the stomach and mass effect along the spleen Electronically Signed   By: Karen Kays M.D.   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 Alcohol abuse, not at present. Multiple large pseudocysts with gastric compression. Nausea/vomiting. Left-sided abdominal pain. 40 lb weight loss two months.  Plan:   I think patient would benefit from speciality center evaluation for possible endoscopic cystgastrostomy.  I have spoken with Dr. Meridee Score, the only gastroenterologist in [place] who does these procedures, and he has no availability.  I do not think patient would do well (given nausea/vomiting and ongoing analgesic requirements) trying to do this as outpatient. I will look into possible hospital-to-hospital transfer for consideration of cystgastrostomy, or same-day transfer to tertiary center for procedure with subsequent return post-procedure to [facility]. Case discussed with Dr. Judithe Modest.   LOS: 2 days   Sarely Stracener M  06/17/2023, 10:21 AM  Cell [phone] If no answer or after 5 PM call [phone]

## 2023-06-17 NOTE — Progress Notes (Signed)
[facility]  PROGRESS NOTE  Samuel Abbott YTK:[id number] DOB: Sep 14, 1960 DOA: 06/15/2023 PCP: Norm Salt, PA   Brief HPI:    62 y.o. 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  Associated with lack of appetite, unintentional weight loss.   In the ED, lab studies notable for leukocytosis.  CT abdomen and pelvis with contrast revealed the following:  Several fluid collections identified have increased in size and are more confluence with a more well-defined wall throughout the left upper quadrant. Is also a new small collection adjacent to the pancreatic head. These are consistent with pseudocysts. The larger areas in the left upper quadrant do have significant mass effect along the posterior wall of the stomach and mass effect along the Spleen.      Assessment/Plan:   Abdominal pain, secondary to multiple large pseudocysts:  Several fluid collections identified have increased in size and are more confluence with a more well-defined wall throughout the left upper quadrant. Is also a new small collection adjacent to the pancreatic head. These are consistent with pseudocysts. -General Surgery consulted, feel this is expanding pseudocyst and not hematoma at splenic bed -Will need cystogastrostomy -GI consulted; no GI available at [facility] for this procedure [facility] contacted, discussed with GI at [facility]  Dr Libby Maw [place] -No beds available at this time, patient is on waiting list for transfer -Continue pain control, as needed IV emetics -Will switch from Zofran to Phenergan -Will discontinue p.o. oxycodone and start OxyContin 15 mg p.o. every 12 hours    Mild hypovolemic hyponatremia Sodium is 130 -Follow-up  BMP in am   Chronic back pain -Started on OxyContin and as needed with IV Dilaudid -Will discontinue oxycodone   Unintentional weight loss in the setting of chronic illness BMI 19 Albumin 3.0 Treat underlying conditions Dietary consultation.   Ongoing tobacco use user Nicotine patch    Medications     cyanocobalamin  1,000 mcg Oral Daily   DULoxetine  20 mg Oral Daily   lidocaine  1 patch Transdermal QHS   nicotine  21 mg Transdermal Daily   pantoprazole  40 mg Oral Q1200     Data Reviewed:   CBG:  Recent Labs  Lab 06/16/23 0331 06/17/23 0404  GLUCAP 107* 91    SpO2: 100 %    Vitals:   06/16/23 1603 06/16/23 2104 06/17/23 0404 06/17/23 0820  BP: (!) 140/78 104/66 113/62 114/63  Pulse: 70 72 70 88  Resp: 18 18 16 18   Temp: 98.1 F (36.7 C) 98.3 F (36.8 C) 98.7 F (37.1 C) 98.7 F (37.1 C)  TempSrc: Oral Oral Oral Oral  SpO2: 100% 98% 97% 100%  Weight:      Height:          Data Reviewed:  Basic Metabolic Panel: Recent Labs  Lab 06/15/23 1518 06/16/23 0636 06/17/23 0521  NA 134*  134* 132* 130*  K 4.7  4.8 3.9 3.8  CL 102  103 102 98  CO2 20* 23 24  GLUCOSE 139*  138* 110* 87  BUN 11  12 9 8   CREATININE 0.53*  0.50* 0.55* 0.47*  CALCIUM 9.6 8.8* 8.4*  MG  --  1.9  --   PHOS  --  3.4  --     CBC: Recent Labs  Lab 06/15/23 1518 06/16/23 0636 06/17/23 0521  WBC 16.5* 13.1* 10.7*  NEUTROABS 13.2*  --   --   HGB 10.6*  12.2* 8.9* 7.6*  HCT 34.4*  36.0* 28.8* 25.0*  MCV 82.9 82.5 82.8  PLT 816* 720* 649*    LFT Recent Labs  Lab 06/15/23 1518 06/17/23 0521  AST 16 10*  ALT 13 8  ALKPHOS 131* 80  BILITOT 0.6 0.5  PROT 7.7 5.1*  ALBUMIN 3.0* 2.0*     Antibiotics: Anti-infectives (From admission, onward)    Start     Dose/Rate Route Frequency Ordered Stop   06/16/23 2200  levofloxacin (LEVAQUIN) IVPB 750 mg        750 mg 100 mL/hr over 90 Minutes Intravenous Every 24 hours 06/16/23 0238     06/16/23 0215   levofloxacin (LEVAQUIN) IVPB 750 mg        750 mg 100 mL/hr over 90 Minutes Intravenous  Once 06/16/23 0123 06/16/23 0455   06/16/23 0130  metroNIDAZOLE (FLAGYL) IVPB 500 mg        500 mg 100 mL/hr over 60 Minutes Intravenous Every 12 hours 06/16/23 0122          DVT prophylaxis: SCDs  Code Status: Full code  Family Communication: Discussed with patient's brother at bedside   CONSULTS General Surgery   Subjective    Continues to complain of left upper quadrant pain despite being on Dilaudid and oxycodone.  Objective    Physical Examination:   General-appears in no acute distress Heart-S1-S2, regular, no murmur auscultated Lungs-clear to auscultation bilaterally, no wheezing or crackles auscultated Abdomen-soft, positive left upper quadrant tenderness to palpation Extremities-no edema in the lower extremities Neuro-alert, oriented x3, no focal deficit noted  Status is: Inpatient:             Meredeth Ide   [facility] If 7PM-7AM, please contact night-coverage at [email or web address], Office  [phone]   06/17/2023, 9:24 AM  LOS: 2 days

## 2023-06-17 NOTE — Progress Notes (Addendum)
[facility] Progress Note     Subjective: CC:  Reports constant abdominal pain that does improve with oxy and dilaudid. Reports one episode of vomiting after drinking ginger ale too quickly. States he tolerated one pancake and 1/2 piece sausage for breakfast. Last reported BM was 12/3.   Objective: Vital signs in last 24 hours: Temp:  [98.1 F (36.7 C)-98.7 F (37.1 C)] 98.7 F (37.1 C) (12/05 0820) Pulse Rate:  [70-88] 88 (12/05 0820) Resp:  [16-18] 18 (12/05 0820) BP: (104-140)/(62-78) 114/63 (12/05 0820) SpO2:  [97 %-100 %] 100 % (12/05 0820) Last BM Date : 06/15/23  Intake/Output from previous day: 12/04 0701 - 12/05 0700 In: 573.3 [P.O.:480; IV Piggyback:93.3] Out: -  Intake/Output this shift: Total I/O In: 240 [P.O.:240] Out: -   PE: Gen:  Alert, NAD, cooperative, chronically ill appearing but mobilizing independently in the room Card:  Regular rate and rhythm Pulm:  Normal effort ORA Abd: Soft, mild distention, TTP LUQ, no cellulitis  Skin: warm and dry, no rashes  Psych: A&Ox3   Lab Results:  Recent Labs    06/16/23 0636 06/17/23 0521  WBC 13.1* 10.7*  HGB 8.9* 7.6*  HCT 28.8* 25.0*  PLT 720* 649*   BMET Recent Labs    06/16/23 0636 06/17/23 0521  NA 132* 130*  K 3.9 3.8  CL 102 98  CO2 23 24  GLUCOSE 110* 87  BUN 9 8  CREATININE 0.55* 0.47*  CALCIUM 8.8* 8.4*   PT/INR No results for input(s): "LABPROT", "INR" in the last 72 hours. CMP     Component Value Date/Time   NA 130 (L) 06/17/2023 0521   K 3.8 06/17/2023 0521   CL 98 06/17/2023 0521   CO2 24 06/17/2023 0521   GLUCOSE 87 06/17/2023 0521   BUN 8 06/17/2023 0521   CREATININE 0.47 (L) 06/17/2023 0521   CALCIUM 8.4 (L) 06/17/2023 0521   PROT 5.1 (L) 06/17/2023 0521   ALBUMIN 2.0 (L) 06/17/2023 0521   AST 10 (L) 06/17/2023 0521   ALT 8 06/17/2023 0521   ALKPHOS 80 06/17/2023 0521   BILITOT 0.5 06/17/2023 0521   GFRNONAA >60 06/17/2023 0521   GFRAA >60 09/10/2018  0401   Lipase     Component Value Date/Time   LIPASE 40 06/15/2023 1518       Studies/Results: CT ABDOMEN PELVIS W CONTRAST  Result Date: 06/15/2023 CLINICAL DATA:  Abdominal pain EXAM: CT ABDOMEN AND PELVIS WITH CONTRAST TECHNIQUE: Multidetector CT imaging of the abdomen and pelvis was performed using the standard protocol following bolus administration of intravenous contrast. RADIATION DOSE REDUCTION: This exam was performed according to the departmental dose-optimization program which includes automated exposure control, adjustment of the mA and/or kV according to patient size and/or use of iterative reconstruction technique. CONTRAST:  75mL OMNIPAQUE IOHEXOL 350 MG/ML SOLN COMPARISON:  CT 05/31/2023. FINDINGS: Lower chest: Slight linear opacity left lung base likely scar or atelectasis. No pleural effusion. Breathing motion at the bases. Hepatobiliary: No focal liver abnormality is seen. No gallstones, gallbladder wall thickening, or biliary dilatation. Patent portal vein. Pancreas: Multiple punctate calcifications once again seen along the pancreas consistent with chronic calcific pancreatitis. The amount of peripancreatic fat stranding has improved from previous. There is also maturing of previous fluid collections in the left upper quadrant including extending along the lesser sac and greater curve of the stomach. There is also areas adjacent to the spleen. The smaller area along the posterior wall of the stomach previously measuring 4.2  x 1.5 cm, today on series 2, image 21 measures 5.8 x 3.5 cm. Several other well-defined lobular areas anterolateral to this are larger and more well-defined. Is also collection abutting the tail of the pancreas which is increased today measuring 4.4 x 3.8 cm. No associated air in these collections. There is also a very small fluid collection immediately above the pancreatic head and posterior to the portal vein on series 2 image 32 measuring 18 mm. This new.  Spleen: Mass effect along the spleen by the large complex fluid collection once again identified. The spleen itself is nondilated. The collection previously measured 13.5 x 9.8 cm and today when measured in the same fashion 16.3 x 12.4 cm. The wall is more well-defined. Adrenals/Urinary Tract: Adrenal glands are unremarkable. Kidneys are normal, without renal calculi, focal lesion, or hydronephrosis. Bladder is unremarkable. Stomach/Bowel: Mass effect along the stomach from the aforementioned fluid collection in the left upper quadrant. Otherwise the bowel is nondilated on this non oral contrast exam. Scattered stool. Vascular/Lymphatic: Normal caliber aorta and IVC. Mild scattered vascular calcifications. Circumaortic left renal vein. No discrete abnormal lymph node enlargement identified in the abdomen and pelvis. Reproductive: Prostate is unremarkable. Other: Improving mesenteric stranding with some residual tracking along the left side of the abdomen and along the perinephric space. Thickening of the lateral conal fascia on the left. Small amount of fluid as well adjacent to segment 3 of the liver is decreasing. Some residual. Musculoskeletal: Curvature and degenerative changes along the spine. Degenerative changes of the pelvis. Old right-sided rib fractures. Trace retrolisthesis seen of L1 on L2. IMPRESSION: Maturing changes from presumed prior episode of pancreatitis. The peripancreatic inflammatory changes are improving. There is preserved enhancement of the pancreatic parenchyma today. However several fluid collections identified have increased in size and are more confluence with a more well-defined wall throughout the left upper quadrant. Is also a new small collection adjacent to the pancreatic head. These are consistent with pseudocysts. The larger areas in the left upper quadrant do have significant mass effect along the posterior wall of the stomach and mass effect along the spleen Electronically  Signed   By: Karen Kays M.D.   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 Luisa Hart -  has a post-operative fluid collection, s/p IR drainage followed by drain removal. Cx NGTD. Marland Kitchen Per op report, Intra-operatively the pancreas was noted to be displaced superiorly and anteriorly due to the hematoma but was intact with some oozing from the pancreatic tail. 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 This should not be managed with IR drains if possible due to risk for fistula.   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 GI consult pending. [facility]. The largest pseudocyst is likely mature enough for endoscopic drainage. Unfortunately Dr. Meridee Score is the only physician in this area who offers cystogastrostomy and he does not have the availability at this time. Defer coordination of cystogastrostomy to the GI team/TR[facility]. For some of our surgical patients who require advanced endoscopic procedures performed at tertiary centers, it has been reasonable to send them for same-day procedures where they go for endoscopy and then come back to [facility] for ongoing management, would be worth asking if this is a possibility given  the bed shortage at tertiary centers at this time. General surgery will sign off. Please call as needed.  FEN: reg diet but not eating enough, encouraged protein shakes and offered post-pyloric TF.  Patient currently on Levaquin. I see no signs of intra-abdominal infection at this point, pseudocysts are presumed to be sterile. Ok to D/C abx.   LOS: 2 days   I reviewed nursing notes, hospitalist notes, last 24 h vitals and pain scores, last 48 h intake and output, last 24 h labs and trends, and last 24 h imaging results.  This care required moderate level of medical decision making.   Hosie Spangle, PA-C [facility] Please see Amion for pager number during day hours 7:00am-4:30pm

## 2023-06-18 ENCOUNTER — Telehealth: Payer: Self-pay

## 2023-06-18 NOTE — Telephone Encounter (Signed)
Received secure chat from [place], NP requesting OV for 07/30/23 with Victorino Dike be cancelled, as patient is actually an [facility] patient (had EGD/colon with [facility] 2024). Recently admitted to [facility] & GI consult was done by Dr. Dulce Sellar. Pt should follow with [facility] for hospital discharge. Appointment cancelled.

## 2023-06-21 LAB — CULTURE, BLOOD (ROUTINE X 2)
Culture: NO GROWTH
Culture: NO GROWTH
Special Requests: ADEQUATE

## 2023-07-12 ENCOUNTER — Telehealth: Payer: Self-pay | Admitting: *Deleted

## 2023-07-12 NOTE — Telephone Encounter (Signed)
Patient just had a CT ABD/ Pelvis W contrast, on 11/24 and 06/15/2023, do I need to order another? Our order was CT  ABD with pancreatic protocol?

## 2023-07-12 NOTE — Telephone Encounter (Signed)
Called patient to inform of the CT ABD with pancreatic protocol and patient states he has appt scheduled for tomorrow with [facility] for CT. Patient was not interested at this time in making appt for a follow up visit.

## 2023-07-12 NOTE — Telephone Encounter (Signed)
-----   Message from Nurse Alona Bene B sent at 04/12/2023  4:16 PM EDT ----- Schedule for repeat CT ABD with pancreatic protocol in 3 months ( end of Dec)

## 2023-07-12 NOTE — Telephone Encounter (Signed)
This is from an older message that had been sent in 9/24 but patient seen by [facility] in 12/24. Patient is actually an [facility] patient (see prior notation). Followup can be with [facility] or [facility]. He will not be following up with [facility]. We do not need his imaging to be sent to [place]. GM

## 2023-07-21 ENCOUNTER — Ambulatory Visit: Payer: 59 | Admitting: Gastroenterology

## 2023-07-30 ENCOUNTER — Ambulatory Visit: Payer: 59 | Admitting: Physician Assistant

## 2023-07-31 ENCOUNTER — Emergency Department (HOSPITAL_BASED_OUTPATIENT_CLINIC_OR_DEPARTMENT_OTHER)
Admission: EM | Admit: 2023-07-31 | Discharge: 2023-07-31 | Disposition: A | Discharge status: Other Acute Inpatient Hospital | Payer: No Typology Code available for payment source | Attending: Emergency Medicine | Admitting: Emergency Medicine

## 2023-07-31 ENCOUNTER — Encounter (HOSPITAL_BASED_OUTPATIENT_CLINIC_OR_DEPARTMENT_OTHER): Payer: Self-pay

## 2023-07-31 ENCOUNTER — Other Ambulatory Visit: Payer: Self-pay

## 2023-07-31 DIAGNOSIS — Y69 Unspecified misadventure during surgical and medical care: Secondary | ICD-10-CM | POA: Diagnosis not present

## 2023-07-31 DIAGNOSIS — T8143XA Infection following a procedure, organ and space surgical site, initial encounter: Secondary | ICD-10-CM | POA: Diagnosis present

## 2023-07-31 LAB — CBC WITH DIFFERENTIAL/PLATELET
Abs Immature Granulocytes: 0.65 10*3/uL — ABNORMAL HIGH (ref 0.00–0.07)
Basophils Absolute: 0 10*3/uL (ref 0.0–0.1)
Basophils Relative: 0 %
Eosinophils Absolute: 0.1 10*3/uL (ref 0.0–0.5)
Eosinophils Relative: 0 %
HCT: 29.1 % — ABNORMAL LOW (ref 39.0–52.0)
Hemoglobin: 9.9 g/dL — ABNORMAL LOW (ref 13.0–17.0)
Immature Granulocytes: 3 %
Lymphocytes Relative: 15 %
Lymphs Abs: 3.2 10*3/uL (ref 0.7–4.0)
MCH: 26 pg (ref 26.0–34.0)
MCHC: 34 g/dL (ref 30.0–36.0)
MCV: 76.4 fL — ABNORMAL LOW (ref 80.0–100.0)
Monocytes Absolute: 1.9 10*3/uL — ABNORMAL HIGH (ref 0.1–1.0)
Monocytes Relative: 9 %
Neutro Abs: 15.9 10*3/uL — ABNORMAL HIGH (ref 1.7–7.7)
Neutrophils Relative %: 73 %
Platelets: 454 10*3/uL — ABNORMAL HIGH (ref 150–400)
RBC: 3.81 MIL/uL — ABNORMAL LOW (ref 4.22–5.81)
RDW: 21.3 % — ABNORMAL HIGH (ref 11.5–15.5)
WBC: 21.9 10*3/uL — ABNORMAL HIGH (ref 4.0–10.5)
nRBC: 0 % (ref 0.0–0.2)

## 2023-07-31 LAB — COMPREHENSIVE METABOLIC PANEL
ALT: 21 U/L (ref 0–44)
AST: 11 U/L — ABNORMAL LOW (ref 15–41)
Albumin: 3.5 g/dL (ref 3.5–5.0)
Alkaline Phosphatase: 48 U/L (ref 38–126)
Anion gap: 9 (ref 5–15)
BUN: 23 mg/dL — ABNORMAL HIGH (ref 6–20)
CO2: 24 mmol/L (ref 22–32)
Calcium: 8.4 mg/dL — ABNORMAL LOW (ref 8.9–10.3)
Chloride: 95 mmol/L — ABNORMAL LOW (ref 98–111)
Creatinine, Ser: 0.47 mg/dL — ABNORMAL LOW (ref 0.61–1.24)
GFR, Estimated: >60 mL/min (ref >60)
Glucose, Bld: 152 mg/dL — ABNORMAL HIGH (ref 70–99)
Potassium: 4.3 mmol/L (ref 3.5–5.1)
Sodium: 128 mmol/L — ABNORMAL LOW (ref 135–145)
Total Bilirubin: 0.4 mg/dL (ref 0.0–1.2)
Total Protein: 5.6 g/dL — ABNORMAL LOW (ref 6.5–8.1)

## 2023-07-31 LAB — LIPASE, BLOOD: Lipase: 120 U/L — ABNORMAL HIGH (ref 11–51)

## 2023-07-31 MED ORDER — HYDROMORPHONE HCL 1 MG/ML IJ SOLN
1.0000 mg | Freq: Once | INTRAMUSCULAR | Status: AC
  Administered 2023-07-31: 1 mg via INTRAVENOUS
  Filled 2023-07-31: qty 1

## 2023-07-31 MED ORDER — METRONIDAZOLE 500 MG/100ML IV SOLN
500.0000 mg | Freq: Once | INTRAVENOUS | Status: AC
  Administered 2023-07-31: 500 mg via INTRAVENOUS
  Filled 2023-07-31: qty 100

## 2023-07-31 MED ORDER — ONDANSETRON HCL 4 MG/2ML IJ SOLN
4.0000 mg | Freq: Once | INTRAMUSCULAR | Status: AC
  Administered 2023-07-31: 4 mg via INTRAVENOUS
  Filled 2023-07-31: qty 2

## 2023-07-31 MED ORDER — CIPROFLOXACIN IN D5W 400 MG/200ML IV SOLN
400.0000 mg | Freq: Once | INTRAVENOUS | Status: AC
  Administered 2023-07-31: 400 mg via INTRAVENOUS
  Filled 2023-07-31: qty 200

## 2023-07-31 NOTE — ED Notes (Signed)
-  Patient accepted to [facility] by Dr. Hoover Brunette.  [employer], 5th Floor, Room [phone].  [place] -- [phone], Charge Nurse -- [phone]. Patient has been added to [facility] and [employer] but no ETA for either.

## 2023-07-31 NOTE — ED Triage Notes (Signed)
Pt POV from home d/t drain from surgery coming out in [facility].  Pt has drain in bag and states he has feces coming out of hole.

## 2023-07-31 NOTE — ED Provider Notes (Signed)
[place] EMERGENCY DEPARTMENT AT [facility] Provider Note   CSN: [id number] Arrival date & time: 07/31/23  0035     History  Chief Complaint  Patient presents with   Post-op Problem    Samuel Abbott is a 63 y.o. male.  Patient is a 63 year old male with history of pancreatic pseudocyst/splenic hematoma requiring surgery in October.  Patient experienced a postoperative splenic artery pseudoaneurysm and abscess formation in the splenic cavity requiring drain placement.  This was performed at [facility] and December.  This evening, the patient states that his drain "fell out".  He states that there was stool coming out of the hole where the drain was placed.  He is describing discomfort to the left side of his abdomen, but denies any fevers or chills.  The history is provided by the patient.       Home Medications Prior to Admission medications   Medication Sig Start Date End Date Taking? Authorizing Provider  acetaminophen (TYLENOL) 500 MG tablet Take 2 tablets (1,000 mg total) by mouth every 6 (six) hours as needed for mild pain, moderate pain, fever or headache. 04/23/23   Barnetta Chapel, PA-C  Cholecalciferol (VITAMIN D-3 PO) Take 1 capsule by mouth daily.    [provider]  Cyanocobalamin (VITAMIN B-12 PO) Take 1 tablet by mouth daily.    [provider]  DULoxetine (CYMBALTA) 20 MG capsule Take 20 mg by mouth 2 (two) times daily. For back pain 01/13/21   [provider]  Ferrous Sulfate (IRON PO) Take 1 tablet by mouth daily.    [provider]  lidocaine (LIDODERM) 5 % Place 1 patch onto the skin at bedtime. Remove & Discard patch within 12 hours or as directed by MD 06/17/23   Meredeth Ide, MD  losartan (COZAAR) 25 MG tablet Take 25 mg by mouth daily. 09/16/20   [provider]  metFORMIN (GLUCOPHAGE) 500 MG tablet Take 1 tablet (500 mg total) by mouth daily with breakfast. Patient taking differently: Take 250 mg by  mouth 2 (two) times daily. 01/24/21 06/19/23  Maness, Loistine Chance, MD  methocarbamol (ROBAXIN) 500 MG tablet Take 1 tablet (500 mg total) by mouth every 6 (six) hours as needed for muscle spasms. 06/03/23   Meuth, Brooke A, PA-C  Multiple Vitamins-Minerals (MENS MULTIVITAMIN) TABS Take 1 tablet by mouth daily.    [provider]  nicotine (NICODERM CQ - DOSED IN MG/24 HOURS) 21 mg/24hr patch Place 1 patch (21 mg total) onto the skin daily. 06/18/23   Meredeth Ide, MD  ondansetron (ZOFRAN) 4 MG tablet Take 1 tablet (4 mg total) by mouth every 8 (eight) hours as needed for nausea or vomiting. Patient not taking: Reported on 06/16/2023 06/03/23   Carlena Bjornstad A, PA-C  oxyCODONE (OXY IR/ROXICODONE) 5 MG immediate release tablet Take 1-2 tablets (5-10 mg total) by mouth every 6 (six) hours as needed for severe pain (pain score 7-10). Patient not taking: Reported on 06/16/2023 06/03/23   Carlena Bjornstad A, PA-C  pantoprazole (PROTONIX) 40 MG tablet Take 1 tablet (40 mg total) by mouth daily. Patient not taking: Reported on 06/16/2023 06/04/23   Rodolph Bong, MD  tiZANidine (ZANAFLEX) 4 MG tablet Take 4 mg by mouth every 8 (eight) hours as needed for muscle spasms. 05/10/23   [provider]      Allergies    Amoxil [amoxicillin], Penicillins, Motrin [ibuprofen], and Other    Review of Systems   Review of Systems  All other systems reviewed and are negative.   Physical Exam Updated Vital Signs BP (!) 155/105 (BP Location: Right Arm)   Pulse 63   Temp 98.2 F (36.8 C)   Resp 20   Ht 6' (1.829 m)   Wt 65.8 kg   SpO2 100%   BMI 19.67 kg/m  Physical Exam Vitals and nursing note reviewed.  Constitutional:      General: He is not in acute distress.    Appearance: He is well-developed. He is not diaphoretic.  HENT:     Head: Normocephalic and atraumatic.  Cardiovascular:     Rate and Rhythm: Normal rate and regular rhythm.     Heart sounds: No murmur heard.    No friction  rub.  Pulmonary:     Effort: Pulmonary effort is normal. No respiratory distress.     Breath sounds: Normal breath sounds. No wheezing or rales.  Abdominal:     General: Bowel sounds are normal. There is no distension.     Palpations: Abdomen is soft.     Tenderness: There is abdominal tenderness. There is no right CVA tenderness, left CVA tenderness, guarding or rebound.     Comments: There is tenderness to palpation in the left upper and left lower quadrant.  The drain site is noted to the left lateral abdomen and appears well.  I do not see any immediate drainage or surrounding erythema.  Musculoskeletal:        General: Normal range of motion.     Cervical back: Normal range of motion and neck supple.  Skin:    General: Skin is warm and dry.  Neurological:     Mental Status: He is alert and oriented to person, place, and time.     Coordination: Coordination normal.     ED Results / Procedures / Treatments   Labs (all labs ordered are listed, but only abnormal results are displayed) Labs Reviewed  COMPREHENSIVE METABOLIC PANEL  LIPASE, BLOOD  CBC WITH DIFFERENTIAL/PLATELET    EKG None  Radiology No results found.  Procedures Procedures    Medications Ordered in ED Medications  HYDROmorphone (DILAUDID) injection 1 mg (has no administration in time range)  ondansetron (ZOFRAN) injection 4 mg (has no administration in time range)    ED Course/ Medical Decision Making/ A&P  Patient is a 63 year old male with history of pancreatic pseudocyst/splenic hematoma with postoperative abscess formation.  Patient presenting with a dislodged drain tube that was placed at [facility] for an abscess/bowel fistula.  Laboratory studies obtained including CBC, CMP, and lipase.  White count is 22,000 and lipase is 120.  Laboratory studies otherwise basically unremarkable.  Case was discussed with Dr. Michaell Cowing from general surgery at [facility] who agrees to accept the patient in  transfer.  He was given Cipro and Flagyl for presumed infection along with Dilaudid for pain and Zofran for nausea.  Patient to be transferred to [facility].  Final Clinical Impression(s) / ED Diagnoses Final diagnoses:  None    Rx / DC Orders ED Discharge Orders     None         Geoffery Lyons, MD 07/31/23 0320

## 2024-06-05 DIAGNOSIS — K861 Other chronic pancreatitis: Secondary | ICD-10-CM | POA: Diagnosis not present

## 2024-06-05 DIAGNOSIS — R0789 Other chest pain: Secondary | ICD-10-CM | POA: Diagnosis not present

## 2024-06-05 DIAGNOSIS — K863 Pseudocyst of pancreas: Secondary | ICD-10-CM | POA: Diagnosis not present

## 2024-06-05 DIAGNOSIS — K852 Alcohol induced acute pancreatitis without necrosis or infection: Secondary | ICD-10-CM | POA: Diagnosis not present

## 2024-06-05 DIAGNOSIS — R079 Chest pain, unspecified: Secondary | ICD-10-CM | POA: Diagnosis not present

## 2024-06-05 DIAGNOSIS — K831 Obstruction of bile duct: Secondary | ICD-10-CM | POA: Diagnosis not present

## 2024-06-05 DIAGNOSIS — K859 Acute pancreatitis without necrosis or infection, unspecified: Secondary | ICD-10-CM | POA: Diagnosis not present

## 2024-06-05 DIAGNOSIS — D7389 Other diseases of spleen: Secondary | ICD-10-CM | POA: Diagnosis not present

## 2024-06-06 DIAGNOSIS — I081 Rheumatic disorders of both mitral and tricuspid valves: Secondary | ICD-10-CM | POA: Diagnosis not present

## 2024-06-06 DIAGNOSIS — I4510 Unspecified right bundle-branch block: Secondary | ICD-10-CM | POA: Diagnosis not present

## 2024-06-06 DIAGNOSIS — I517 Cardiomegaly: Secondary | ICD-10-CM | POA: Diagnosis not present

## 2024-06-06 DIAGNOSIS — R1013 Epigastric pain: Secondary | ICD-10-CM | POA: Diagnosis not present

## 2024-06-20 DIAGNOSIS — R002 Palpitations: Secondary | ICD-10-CM | POA: Diagnosis not present

## 2024-06-26 DIAGNOSIS — K8689 Other specified diseases of pancreas: Secondary | ICD-10-CM | POA: Diagnosis not present

## 2024-06-26 DIAGNOSIS — M549 Dorsalgia, unspecified: Secondary | ICD-10-CM | POA: Diagnosis not present

## 2024-06-26 DIAGNOSIS — K862 Cyst of pancreas: Secondary | ICD-10-CM | POA: Diagnosis not present

## 2024-06-26 DIAGNOSIS — K852 Alcohol induced acute pancreatitis without necrosis or infection: Secondary | ICD-10-CM | POA: Diagnosis not present

## 2024-06-26 DIAGNOSIS — D649 Anemia, unspecified: Secondary | ICD-10-CM | POA: Diagnosis not present

## 2024-06-26 DIAGNOSIS — Z9081 Acquired absence of spleen: Secondary | ICD-10-CM | POA: Diagnosis not present

## 2024-06-26 DIAGNOSIS — K863 Pseudocyst of pancreas: Secondary | ICD-10-CM | POA: Diagnosis not present

## 2024-06-26 DIAGNOSIS — K861 Other chronic pancreatitis: Secondary | ICD-10-CM | POA: Diagnosis not present

## 2024-06-26 DIAGNOSIS — Z8719 Personal history of other diseases of the digestive system: Secondary | ICD-10-CM | POA: Diagnosis not present

## 2024-06-26 DIAGNOSIS — K859 Acute pancreatitis without necrosis or infection, unspecified: Secondary | ICD-10-CM | POA: Diagnosis not present
